# Patient Record
Sex: Female | Born: 1935 | Race: White | Hispanic: No | Smoking: Never smoker
Health system: Southern US, Community
[De-identification: ages and names within clinical notes are randomized; demographics above are authoritative.]

## PROBLEM LIST (undated history)

## (undated) DIAGNOSIS — Z860100 Personal history of colon polyps, unspecified: Secondary | ICD-10-CM

## (undated) DIAGNOSIS — K219 Gastro-esophageal reflux disease without esophagitis: Secondary | ICD-10-CM

## (undated) DIAGNOSIS — M549 Dorsalgia, unspecified: Secondary | ICD-10-CM

## (undated) DIAGNOSIS — G8929 Other chronic pain: Secondary | ICD-10-CM

## (undated) DIAGNOSIS — M419 Scoliosis, unspecified: Secondary | ICD-10-CM

## (undated) DIAGNOSIS — K649 Unspecified hemorrhoids: Secondary | ICD-10-CM

## (undated) DIAGNOSIS — I639 Cerebral infarction, unspecified: Secondary | ICD-10-CM

## (undated) DIAGNOSIS — G4700 Insomnia, unspecified: Secondary | ICD-10-CM

## (undated) DIAGNOSIS — R002 Palpitations: Secondary | ICD-10-CM

## (undated) DIAGNOSIS — R238 Other skin changes: Secondary | ICD-10-CM

## (undated) DIAGNOSIS — J329 Chronic sinusitis, unspecified: Secondary | ICD-10-CM

## (undated) DIAGNOSIS — I509 Heart failure, unspecified: Secondary | ICD-10-CM

## (undated) DIAGNOSIS — K5790 Diverticulosis of intestine, part unspecified, without perforation or abscess without bleeding: Secondary | ICD-10-CM

## (undated) DIAGNOSIS — I6529 Occlusion and stenosis of unspecified carotid artery: Secondary | ICD-10-CM

## (undated) DIAGNOSIS — K222 Esophageal obstruction: Secondary | ICD-10-CM

## (undated) DIAGNOSIS — I10 Essential (primary) hypertension: Secondary | ICD-10-CM

## (undated) DIAGNOSIS — R609 Edema, unspecified: Secondary | ICD-10-CM

## (undated) DIAGNOSIS — R600 Localized edema: Secondary | ICD-10-CM

## (undated) DIAGNOSIS — I080 Rheumatic disorders of both mitral and aortic valves: Secondary | ICD-10-CM

## (undated) DIAGNOSIS — R011 Cardiac murmur, unspecified: Secondary | ICD-10-CM

## (undated) DIAGNOSIS — R233 Spontaneous ecchymoses: Secondary | ICD-10-CM

## (undated) HISTORY — PX: COLONOSCOPY: SHX174

## (undated) HISTORY — DX: Gastro-esophageal reflux disease without esophagitis: K21.9

## (undated) HISTORY — PX: ESOPHAGOGASTRODUODENOSCOPY: SHX1529

## (undated) HISTORY — DX: Esophageal obstruction: K22.2

## (undated) HISTORY — PX: BREAST ENHANCEMENT SURGERY: SHX7

## (undated) HISTORY — DX: Unspecified hemorrhoids: K64.9

## (undated) HISTORY — PX: OTHER SURGICAL HISTORY: SHX169

## (undated) HISTORY — PX: TOTAL HIP ARTHROPLASTY: SHX124

## (undated) HISTORY — PX: EYE SURGERY: SHX253

## (undated) HISTORY — DX: Diverticulosis of intestine, part unspecified, without perforation or abscess without bleeding: K57.90

## (undated) HISTORY — DX: Insomnia, unspecified: G47.00

## (undated) HISTORY — PX: CHOLECYSTECTOMY: SHX55

## (undated) HISTORY — PX: DILATION AND CURETTAGE OF UTERUS: SHX78

## (undated) HISTORY — DX: Scoliosis, unspecified: M41.9

## (undated) HISTORY — DX: Occlusion and stenosis of unspecified carotid artery: I65.29

## (undated) HISTORY — DX: Essential (primary) hypertension: I10

---

## 2000-05-17 ENCOUNTER — Encounter: Payer: Self-pay | Admitting: Emergency Medicine

## 2000-05-17 ENCOUNTER — Emergency Department (HOSPITAL_COMMUNITY): Admission: EM | Admit: 2000-05-17 | Discharge: 2000-05-17 | Payer: Self-pay | Admitting: Emergency Medicine

## 2001-06-08 ENCOUNTER — Other Ambulatory Visit: Admission: RE | Admit: 2001-06-08 | Discharge: 2001-06-08 | Payer: Self-pay | Admitting: *Deleted

## 2003-03-28 ENCOUNTER — Encounter: Payer: Self-pay | Admitting: Internal Medicine

## 2003-03-28 ENCOUNTER — Ambulatory Visit (HOSPITAL_COMMUNITY): Admission: RE | Admit: 2003-03-28 | Discharge: 2003-03-28 | Payer: Self-pay | Admitting: Internal Medicine

## 2005-03-20 ENCOUNTER — Ambulatory Visit: Payer: Self-pay | Admitting: Gastroenterology

## 2005-04-19 ENCOUNTER — Ambulatory Visit (HOSPITAL_COMMUNITY): Admission: RE | Admit: 2005-04-19 | Discharge: 2005-04-19 | Payer: Self-pay | Admitting: Gastroenterology

## 2005-04-19 ENCOUNTER — Ambulatory Visit: Payer: Self-pay | Admitting: Gastroenterology

## 2005-09-07 ENCOUNTER — Emergency Department (HOSPITAL_COMMUNITY): Admission: EM | Admit: 2005-09-07 | Discharge: 2005-09-07 | Payer: Self-pay | Admitting: Emergency Medicine

## 2006-11-19 ENCOUNTER — Ambulatory Visit: Payer: Self-pay | Admitting: Gastroenterology

## 2006-11-25 ENCOUNTER — Ambulatory Visit: Payer: Self-pay | Admitting: Gastroenterology

## 2007-02-20 ENCOUNTER — Encounter: Payer: Self-pay | Admitting: Internal Medicine

## 2007-06-17 ENCOUNTER — Other Ambulatory Visit: Admission: RE | Admit: 2007-06-17 | Discharge: 2007-06-17 | Payer: Self-pay | Admitting: Obstetrics & Gynecology

## 2007-09-14 ENCOUNTER — Encounter: Admission: RE | Admit: 2007-09-14 | Discharge: 2007-09-14 | Payer: Self-pay | Admitting: Internal Medicine

## 2008-12-07 ENCOUNTER — Emergency Department (HOSPITAL_COMMUNITY): Admission: EM | Admit: 2008-12-07 | Discharge: 2008-12-08 | Payer: Self-pay | Admitting: Emergency Medicine

## 2009-06-09 ENCOUNTER — Encounter: Admission: RE | Admit: 2009-06-09 | Discharge: 2009-06-09 | Payer: Self-pay | Admitting: Internal Medicine

## 2010-05-11 ENCOUNTER — Inpatient Hospital Stay (HOSPITAL_COMMUNITY)
Admission: RE | Admit: 2010-05-11 | Discharge: 2010-05-14 | Payer: Self-pay | Source: Home / Self Care | Admitting: Orthopedic Surgery

## 2010-11-08 LAB — CBC
HCT: 27 % — ABNORMAL LOW (ref 36.0–46.0)
HCT: 28.5 % — ABNORMAL LOW (ref 36.0–46.0)
Hemoglobin: 9.3 g/dL — ABNORMAL LOW (ref 12.0–15.0)
MCV: 93.3 fL (ref 78.0–100.0)
MCV: 94.1 fL (ref 78.0–100.0)
MCV: 93.6 fL (ref 78.0–100.0)
Platelets: 137 10*3/uL — ABNORMAL LOW (ref 150–400)
Platelets: 145 10*3/uL — ABNORMAL LOW (ref 150–400)
Platelets: 245 10*3/uL (ref 150–400)
RBC: 2.8 MIL/uL — ABNORMAL LOW (ref 3.87–5.11)
RBC: 3.03 MIL/uL — ABNORMAL LOW (ref 3.87–5.11)
RBC: 4.5 MIL/uL (ref 3.87–5.11)
RDW: 13.3 % (ref 11.5–15.5)
WBC: 12.8 10*3/uL — ABNORMAL HIGH (ref 4.0–10.5)
WBC: 8.6 10*3/uL (ref 4.0–10.5)
WBC: 9 10*3/uL (ref 4.0–10.5)
WBC: 7.3 10*3/uL (ref 4.0–10.5)

## 2010-11-08 LAB — PROTIME-INR
INR: 1.3 (ref 0.00–1.49)
INR: 1.8 — ABNORMAL HIGH (ref 0.00–1.49)
INR: 2.45 — ABNORMAL HIGH (ref 0.00–1.49)
Prothrombin Time: 26.7 seconds — ABNORMAL HIGH (ref 11.6–15.2)

## 2010-11-08 LAB — URINALYSIS, ROUTINE W REFLEX MICROSCOPIC
Bilirubin Urine: NEGATIVE
Hgb urine dipstick: NEGATIVE
Specific Gravity, Urine: 1.018 (ref 1.005–1.030)
Urobilinogen, UA: 0.2 mg/dL (ref 0.0–1.0)
pH: 6 (ref 5.0–8.0)

## 2010-11-08 LAB — TYPE AND SCREEN

## 2010-11-08 LAB — BASIC METABOLIC PANEL
Chloride: 101 mEq/L (ref 96–112)
Creatinine, Ser: 0.77 mg/dL (ref 0.4–1.2)
GFR calc Af Amer: >60 mL/min (ref >60)
GFR calc Af Amer: >60 mL/min (ref >60)
GFR calc non Af Amer: >60 mL/min (ref >60)
GFR calc non Af Amer: >60 mL/min (ref >60)
Potassium: 3.1 mEq/L — ABNORMAL LOW (ref 3.5–5.1)
Potassium: 4.4 mEq/L (ref 3.5–5.1)
Sodium: 138 mEq/L (ref 135–145)

## 2010-11-08 LAB — ABO/RH: ABO/RH(D): A NEG

## 2010-11-08 LAB — COMPREHENSIVE METABOLIC PANEL
ALT: 10 U/L (ref 0–35)
AST: 16 U/L (ref 0–37)
Albumin: 3.9 g/dL (ref 3.5–5.2)
Alkaline Phosphatase: 89 U/L (ref 39–117)
Chloride: 106 mEq/L (ref 96–112)
GFR calc Af Amer: >60 mL/min (ref >60)
Potassium: 4.7 mEq/L (ref 3.5–5.1)
Sodium: 146 mEq/L — ABNORMAL HIGH (ref 135–145)
Total Bilirubin: 0.7 mg/dL (ref 0.3–1.2)
Total Protein: 7.2 g/dL (ref 6.0–8.3)

## 2010-11-08 LAB — URINE MICROSCOPIC-ADD ON

## 2010-11-08 LAB — SURGICAL PCR SCREEN: MRSA, PCR: NEGATIVE

## 2011-01-11 NOTE — Assessment & Plan Note (Signed)
[facility]                         GASTROENTEROLOGY OFFICE NOTE   NAME:Melanie [name], Melanie [name]                     MRN:          [id number]  DATE:11/19/2006                            DOB:          [birth year]    The patient comes in to discuss colon recall and upper endoscopy as  well. She said she had esophageal attack three weeks ago and actually  the food stayed in the throat for almost 24 hours, which I warned her  was not a smart thing to do. She says she is better now. She started  taking her Nexium. She does get some nausea and especially since she has  been taking the Naprosyn that she uses.   Her other medications include:  1. Multivitamin.  2. Hydrochlorothiazide.  3. Calcium.  4. Nexium that we talked about.   She does have a known Schatzki's ring and has had dysphagia before and  she also has diverticulosis. Her last colonoscopic examination was  November 2002. She has had some problems with hemorrhoids associated  with this. She also has hypertension and is status post cholecystectomy  and she does have some arthritis as well. She is to be scheduled for a  colonoscopic examination and at the same time we will do an upper  endoscopy and dilate her accordingly. The patient is satisfied with  this.   PHYSICAL EXAMINATION:  Revealed a weight of 115, blood pressure 100/60,  pulse 80 and regular.  NECK, HEART, EXTREMITIES: Unremarkable.   IMPRESSION:  As above.   It should be noted that she is amazingly healthy-looking 77 year old.  She is retired Scientist, research (medical) and keeps herself  in her good health and eats very healthy she says as well.     Ulyess Mort, MD  Electronically Signed    SML/MedQ  DD: 11/19/2006  DT: 11/19/2006  Job #: [id number]

## 2012-09-28 ENCOUNTER — Encounter: Payer: Self-pay | Admitting: Gastroenterology

## 2012-10-19 ENCOUNTER — Ambulatory Visit (INDEPENDENT_AMBULATORY_CARE_PROVIDER_SITE_OTHER): Payer: Medicare Other | Admitting: Gastroenterology

## 2012-10-19 ENCOUNTER — Encounter: Payer: Self-pay | Admitting: Gastroenterology

## 2012-10-19 VITALS — BP 120/64 | HR 68 | Ht 59.0 in | Wt 116.8 lb

## 2012-10-19 DIAGNOSIS — K921 Melena: Secondary | ICD-10-CM

## 2012-10-19 NOTE — Progress Notes (Signed)
History of Present Illness: This is an 79 year old female noted several episodes of small amounts of bright red blood per rectum for several months.. She previously underwent colonoscopy by Dr. Corinda Gubler in April 2008 which showed diverticulosis and internal hemorrhoids. She has recently been treated by Dr. Kinnie Scales and has had several hemorrhoid treatments. She reports that he performed hemorrhoidal banding. Unfortunately I do not have those records. She was recently she has seen last bleeding and has been using Anusol suppositories intermittently.  Review of Systems: Pertinent positive and negative review of systems were noted in the above HPI section. All other review of systems were otherwise negative.  Current Medications, Allergies, Past Medical History, Past Surgical History, Family History and Social History were reviewed in [employer] record.  Physical Exam: General: Well developed , well nourished, no acute distress Head: Normocephalic and atraumatic Eyes:  sclerae anicteric, EOMI Ears: Normal auditory acuity Mouth: No deformity or lesions Neck: Supple, no masses or thyromegaly Lungs: Clear throughout to auscultation Heart: Regular rate and rhythm; no murmurs, rubs or bruits Abdomen: Soft, non tender and non distended. No masses, hepatosplenomegaly or hernias noted. Normal Bowel sounds Rectal: Small external hemorrhoidal tags no internal lesions Hemoccult negative brown stool the vault Musculoskeletal: Symmetrical with no gross deformities  Skin: No lesions on visible extremities Pulses:  Normal pulses noted Extremities: No clubbing, cyanosis, edema or deformities noted Neurological: Alert oriented x 4, grossly nonfocal Cervical Nodes:  No significant cervical adenopathy Inguinal Nodes: No significant inguinal adenopathy Psychological:  Alert and cooperative. Normal mood and affect  Assessment and Recommendations:  1. Small-volume hematochezia likely  secondary to internal hemorrhoids. Rule out other colorectal sources of bleeding. I recommended proceeding with colonoscopy to evaluate for other causes of rectal bleeding and to consider referral to a colorectal specialist for hemorrhoid management. For now she prefers to treat her internal hemorrhoids with suppositories. I advised her to contact us if she would like to proceed with colonoscopy.

## 2012-10-19 NOTE — Patient Instructions (Addendum)
It has been recommended to you by your physician that you have a(n) Colonoscopy completed. Per your request, we did not schedule the procedure(s) today. Please contact our office at [phone] should you decide to have the procedure completed.  You have been given rectal care instructions.   Continue Anusol suppositories.   Thank you for choosing me and [facility].  Venita Lick. Pleas Koch., MD., Clementeen Graham

## 2013-01-01 ENCOUNTER — Other Ambulatory Visit: Payer: Self-pay | Admitting: Neurological Surgery

## 2013-01-01 DIAGNOSIS — M47816 Spondylosis without myelopathy or radiculopathy, lumbar region: Secondary | ICD-10-CM

## 2013-01-06 ENCOUNTER — Ambulatory Visit
Admission: RE | Admit: 2013-01-06 | Discharge: 2013-01-06 | Disposition: A | Discharge status: Home / Self Care | Payer: 59 | Source: Ambulatory Visit | Attending: Neurological Surgery | Admitting: Neurological Surgery

## 2013-01-06 DIAGNOSIS — M47816 Spondylosis without myelopathy or radiculopathy, lumbar region: Secondary | ICD-10-CM

## 2013-01-14 ENCOUNTER — Other Ambulatory Visit: Payer: Self-pay | Admitting: Neurological Surgery

## 2013-01-20 ENCOUNTER — Encounter (HOSPITAL_COMMUNITY): Payer: Self-pay | Admitting: Pharmacy Technician

## 2013-01-22 ENCOUNTER — Ambulatory Visit (HOSPITAL_COMMUNITY)
Admission: RE | Admit: 2013-01-22 | Discharge: 2013-01-22 | Disposition: A | Discharge status: Home / Self Care | Payer: Medicare Other | Source: Ambulatory Visit | Attending: Anesthesiology | Admitting: Anesthesiology

## 2013-01-22 ENCOUNTER — Encounter (HOSPITAL_COMMUNITY): Payer: Self-pay

## 2013-01-22 ENCOUNTER — Encounter (HOSPITAL_COMMUNITY)
Admission: RE | Admit: 2013-01-22 | Discharge: 2013-01-22 | Disposition: A | Discharge status: Home / Self Care | Payer: Medicare Other | Source: Ambulatory Visit | Attending: Neurological Surgery | Admitting: Neurological Surgery

## 2013-01-22 DIAGNOSIS — Z01812 Encounter for preprocedural laboratory examination: Secondary | ICD-10-CM | POA: Insufficient documentation

## 2013-01-22 DIAGNOSIS — I10 Essential (primary) hypertension: Secondary | ICD-10-CM | POA: Insufficient documentation

## 2013-01-22 DIAGNOSIS — M4120 Other idiopathic scoliosis, site unspecified: Secondary | ICD-10-CM | POA: Insufficient documentation

## 2013-01-22 DIAGNOSIS — M48061 Spinal stenosis, lumbar region without neurogenic claudication: Secondary | ICD-10-CM | POA: Insufficient documentation

## 2013-01-22 DIAGNOSIS — Z01810 Encounter for preprocedural cardiovascular examination: Secondary | ICD-10-CM | POA: Insufficient documentation

## 2013-01-22 HISTORY — DX: Spontaneous ecchymoses: R23.3

## 2013-01-22 HISTORY — DX: Personal history of colonic polyps: Z86.010

## 2013-01-22 HISTORY — DX: Personal history of colon polyps, unspecified: Z86.0100

## 2013-01-22 HISTORY — DX: Dorsalgia, unspecified: M54.9

## 2013-01-22 HISTORY — DX: Chronic sinusitis, unspecified: J32.9

## 2013-01-22 HISTORY — DX: Localized edema: R60.0

## 2013-01-22 HISTORY — DX: Edema, unspecified: R60.9

## 2013-01-22 HISTORY — DX: Other skin changes: R23.8

## 2013-01-22 HISTORY — DX: Other chronic pain: G89.29

## 2013-01-22 HISTORY — DX: Cardiac murmur, unspecified: R01.1

## 2013-01-22 LAB — BASIC METABOLIC PANEL
BUN: 16 mg/dL (ref 6–23)
CO2: 32 mEq/L (ref 19–32)
Calcium: 10 mg/dL (ref 8.4–10.5)
Creatinine, Ser: 0.73 mg/dL (ref 0.50–1.10)
GFR calc Af Amer: 87 mL/min — ABNORMAL LOW (ref >90)

## 2013-01-22 LAB — CBC
MCHC: 33.8 g/dL (ref 30.0–36.0)
MCV: 93.1 fL (ref 78.0–100.0)
Platelets: 235 10*3/uL (ref 150–400)
RDW: 14.3 % (ref 11.5–15.5)
WBC: 6.8 10*3/uL (ref 4.0–10.5)

## 2013-01-22 LAB — SURGICAL PCR SCREEN
MRSA, PCR: NEGATIVE
Staphylococcus aureus: POSITIVE — AB

## 2013-01-22 NOTE — Pre-Procedure Instructions (Signed)
Mayling Aber Gradillas  01/22/2013   Your procedure is scheduled on:  Tues, June 3 @ 11:58 AM  Report to [facility] at 9:00 AM.  Call this number if you have problems the morning of surgery: [phone]   Remember:   Do not eat food or drink liquids after midnight.   Take these medicines the morning of surgery with A SIP OF WATER: Amlodipine(Norvasc),Metoprolol(Toprol),and Protonix(Pantoprazole)              Stop taking your Aspirin.No Goody's,BC's,Ibuprofen,Aleve,Fish Oil,or any Herbal Medications   Do not wear jewelry, make-up or nail polish.  Do not wear lotions, powders, or perfumes. You may wear deodorant.  Do not shave 48 hours prior to surgery. .  Do not bring valuables to the hospital.  [facility] is not responsible                   for any belongings or valuables.  Contacts, dentures or bridgework may not be worn into surgery.  Leave suitcase in the car. After surgery it may be brought to your room.  For patients admitted to the hospital, checkout time is 11:00 AM the day of  discharge.   Patients discharged the day of surgery will not be allowed to drive  home.    Special Instructions: Shower using CHG 2 nights before surgery and the night before surgery.  If you shower the day of surgery use CHG.  Use special wash - you have one bottle of CHG for all showers.  You should use approximately 1/3 of the bottle for each shower.   Please read over the following fact sheets that you were given: Pain Booklet, Coughing and Deep Breathing, MRSA Information and Surgical Site Infection Prevention

## 2013-01-22 NOTE — Progress Notes (Signed)
Pt doesn't have a cardiologist  Echo/Stress test done 9yrs ago  Denies ever having a heart cath   Dr.Richard [name] is Medical Md  Denies EKG or CXR in past yr

## 2013-01-25 MED ORDER — CEFAZOLIN SODIUM-DEXTROSE 2-3 GM-% IV SOLR
2.0000 g | INTRAVENOUS | Status: AC
  Administered 2013-01-26: 2 g via INTRAVENOUS
  Filled 2013-01-25: qty 50

## 2013-01-26 ENCOUNTER — Ambulatory Visit (HOSPITAL_COMMUNITY): Payer: Medicare Other

## 2013-01-26 ENCOUNTER — Ambulatory Visit (HOSPITAL_COMMUNITY)
Admission: RE | Admit: 2013-01-26 | Discharge: 2013-01-27 | DRG: 491 | Disposition: A | Discharge status: Home / Self Care | Payer: Medicare Other | Source: Ambulatory Visit | Attending: Neurological Surgery | Admitting: Neurological Surgery

## 2013-01-26 ENCOUNTER — Encounter (HOSPITAL_COMMUNITY): Payer: Self-pay | Admitting: Anesthesiology

## 2013-01-26 ENCOUNTER — Ambulatory Visit (HOSPITAL_COMMUNITY): Payer: Medicare Other | Admitting: Anesthesiology

## 2013-01-26 ENCOUNTER — Encounter (HOSPITAL_COMMUNITY)
Admission: RE | Disposition: A | Discharge status: Home / Self Care | Payer: Self-pay | Source: Ambulatory Visit | Attending: Neurological Surgery

## 2013-01-26 DIAGNOSIS — M47817 Spondylosis without myelopathy or radiculopathy, lumbosacral region: Secondary | ICD-10-CM | POA: Insufficient documentation

## 2013-01-26 DIAGNOSIS — M419 Scoliosis, unspecified: Secondary | ICD-10-CM | POA: Diagnosis present

## 2013-01-26 DIAGNOSIS — I10 Essential (primary) hypertension: Secondary | ICD-10-CM | POA: Insufficient documentation

## 2013-01-26 DIAGNOSIS — Z7982 Long term (current) use of aspirin: Secondary | ICD-10-CM | POA: Insufficient documentation

## 2013-01-26 DIAGNOSIS — M48062 Spinal stenosis, lumbar region with neurogenic claudication: Secondary | ICD-10-CM | POA: Insufficient documentation

## 2013-01-26 DIAGNOSIS — M4120 Other idiopathic scoliosis, site unspecified: Secondary | ICD-10-CM | POA: Insufficient documentation

## 2013-01-26 DIAGNOSIS — Z79899 Other long term (current) drug therapy: Secondary | ICD-10-CM | POA: Insufficient documentation

## 2013-01-26 HISTORY — PX: LUMBAR LAMINECTOMY/DECOMPRESSION MICRODISCECTOMY: SHX5026

## 2013-01-26 SURGERY — LUMBAR LAMINECTOMY/DECOMPRESSION MICRODISCECTOMY 1 LEVEL
Anesthesia: General | Site: Back | Laterality: Left | Wound class: Clean

## 2013-01-26 MED ORDER — ONDANSETRON HCL 4 MG/2ML IJ SOLN
INTRAMUSCULAR | Status: DC | PRN
  Administered 2013-01-26: 4 mg via INTRAVENOUS

## 2013-01-26 MED ORDER — PROPOFOL 10 MG/ML IV BOLUS
INTRAVENOUS | Status: DC | PRN
  Administered 2013-01-26: 130 mg via INTRAVENOUS

## 2013-01-26 MED ORDER — OXYCODONE HCL 5 MG PO TABS: 5.0000 mg | ORAL_TABLET | Freq: Once | ORAL | Status: DC | PRN

## 2013-01-26 MED ORDER — METOPROLOL SUCCINATE ER 50 MG PO TB24
50.0000 mg | ORAL_TABLET | Freq: Every day | ORAL | Status: DC
  Administered 2013-01-27: 50 mg via ORAL
  Filled 2013-01-26: qty 1

## 2013-01-26 MED ORDER — FENTANYL CITRATE 0.05 MG/ML IJ SOLN
INTRAMUSCULAR | Status: DC | PRN
  Administered 2013-01-26: 75 ug via INTRAVENOUS

## 2013-01-26 MED ORDER — SODIUM CHLORIDE 0.9 % IV SOLN: 250.0000 mL | INTRAVENOUS | Status: DC

## 2013-01-26 MED ORDER — MENTHOL 3 MG MT LOZG
1.0000 | LOZENGE | OROMUCOSAL | Status: DC | PRN
  Filled 2013-01-26: qty 9

## 2013-01-26 MED ORDER — ROCURONIUM BROMIDE 100 MG/10ML IV SOLN
INTRAVENOUS | Status: DC | PRN
  Administered 2013-01-26: 50 mg via INTRAVENOUS

## 2013-01-26 MED ORDER — ARTIFICIAL TEARS OP OINT
TOPICAL_OINTMENT | OPHTHALMIC | Status: DC | PRN
  Administered 2013-01-26: 1 via OPHTHALMIC

## 2013-01-26 MED ORDER — SODIUM CHLORIDE 0.9 % IR SOLN
Status: DC | PRN
  Administered 2013-01-26: 12:00:00

## 2013-01-26 MED ORDER — ACETAMINOPHEN 325 MG PO TABS: 650.0000 mg | ORAL_TABLET | ORAL | Status: DC | PRN

## 2013-01-26 MED ORDER — ONDANSETRON HCL 4 MG/2ML IJ SOLN: 4.0000 mg | INTRAMUSCULAR | Status: DC | PRN

## 2013-01-26 MED ORDER — HYDROCHLOROTHIAZIDE 25 MG PO TABS
25.0000 mg | ORAL_TABLET | Freq: Every day | ORAL | Status: DC
  Administered 2013-01-26 – 2013-01-27 (×2): 25 mg via ORAL
  Filled 2013-01-26 (×2): qty 1

## 2013-01-26 MED ORDER — NEOSTIGMINE METHYLSULFATE 1 MG/ML IJ SOLN
INTRAMUSCULAR | Status: DC | PRN
  Administered 2013-01-26: 3 mg via INTRAVENOUS

## 2013-01-26 MED ORDER — LIDOCAINE-EPINEPHRINE 1 %-1:100000 IJ SOLN
INTRAMUSCULAR | Status: DC | PRN
  Administered 2013-01-26: 5 mL

## 2013-01-26 MED ORDER — PANTOPRAZOLE SODIUM 40 MG PO TBEC
40.0000 mg | DELAYED_RELEASE_TABLET | Freq: Every day | ORAL | Status: DC
  Administered 2013-01-27: 40 mg via ORAL
  Filled 2013-01-26: qty 1

## 2013-01-26 MED ORDER — PHENOL 1.4 % MT LIQD
1.0000 | OROMUCOSAL | Status: DC | PRN
  Filled 2013-01-26: qty 177

## 2013-01-26 MED ORDER — HEMOSTATIC AGENTS (NO CHARGE) OPTIME
TOPICAL | Status: DC | PRN
  Administered 2013-01-26: 1 via TOPICAL

## 2013-01-26 MED ORDER — ONDANSETRON HCL 4 MG/2ML IJ SOLN: 4.0000 mg | Freq: Four times a day (QID) | INTRAMUSCULAR | Status: DC | PRN

## 2013-01-26 MED ORDER — DIAZEPAM 5 MG PO TABS: 5.0000 mg | ORAL_TABLET | Freq: Four times a day (QID) | ORAL | Status: DC | PRN

## 2013-01-26 MED ORDER — ALUM & MAG HYDROXIDE-SIMETH 200-200-20 MG/5ML PO SUSP: 30.0000 mL | Freq: Four times a day (QID) | ORAL | Status: DC | PRN

## 2013-01-26 MED ORDER — BACITRACIN 50000 UNITS IM SOLR
INTRAMUSCULAR | Status: AC
  Filled 2013-01-26: qty 1

## 2013-01-26 MED ORDER — LIDOCAINE HCL (CARDIAC) 20 MG/ML IV SOLN
INTRAVENOUS | Status: DC | PRN
  Administered 2013-01-26: 70 mg via INTRAVENOUS

## 2013-01-26 MED ORDER — 0.9 % SODIUM CHLORIDE (POUR BTL) OPTIME
TOPICAL | Status: DC | PRN
  Administered 2013-01-26: 1000 mL

## 2013-01-26 MED ORDER — SODIUM CHLORIDE 0.9 % IV SOLN
INTRAVENOUS | Status: AC
  Filled 2013-01-26: qty 500

## 2013-01-26 MED ORDER — ACETAMINOPHEN 650 MG RE SUPP: 650.0000 mg | RECTAL | Status: DC | PRN

## 2013-01-26 MED ORDER — THROMBIN 5000 UNITS EX SOLR
CUTANEOUS | Status: DC | PRN
  Administered 2013-01-26 (×2): 5000 [IU] via TOPICAL

## 2013-01-26 MED ORDER — BUPIVACAINE HCL (PF) 0.5 % IJ SOLN
INTRAMUSCULAR | Status: DC | PRN
  Administered 2013-01-26: 5 mL
  Administered 2013-01-26: 10 mL

## 2013-01-26 MED ORDER — FENTANYL CITRATE 0.05 MG/ML IJ SOLN: 25.0000 ug | INTRAMUSCULAR | Status: DC | PRN

## 2013-01-26 MED ORDER — LACTATED RINGERS IV SOLN
INTRAVENOUS | Status: DC | PRN
  Administered 2013-01-26: 12:00:00 via INTRAVENOUS

## 2013-01-26 MED ORDER — SODIUM CHLORIDE 0.9 % IJ SOLN: 3.0000 mL | Freq: Two times a day (BID) | INTRAMUSCULAR | Status: DC

## 2013-01-26 MED ORDER — OXYCODONE HCL 5 MG/5ML PO SOLN: 5.0000 mg | Freq: Once | ORAL | Status: DC | PRN

## 2013-01-26 MED ORDER — MORPHINE SULFATE 2 MG/ML IJ SOLN: 1.0000 mg | INTRAMUSCULAR | Status: DC | PRN

## 2013-01-26 MED ORDER — GLYCOPYRROLATE 0.2 MG/ML IJ SOLN
INTRAMUSCULAR | Status: DC | PRN
  Administered 2013-01-26: 0.4 mg via INTRAVENOUS

## 2013-01-26 MED ORDER — SODIUM CHLORIDE 0.9 % IJ SOLN: 3.0000 mL | INTRAMUSCULAR | Status: DC | PRN

## 2013-01-26 MED ORDER — OXYCODONE-ACETAMINOPHEN 5-325 MG PO TABS
1.0000 | ORAL_TABLET | ORAL | Status: DC | PRN
  Administered 2013-01-26 – 2013-01-27 (×3): 1 via ORAL
  Filled 2013-01-26 (×3): qty 1

## 2013-01-26 MED ORDER — AMLODIPINE BESYLATE 5 MG PO TABS
5.0000 mg | ORAL_TABLET | Freq: Every day | ORAL | Status: DC
  Administered 2013-01-27: 5 mg via ORAL
  Filled 2013-01-26: qty 1

## 2013-01-26 SURGICAL SUPPLY — 54 items
ADH SKN CLS APL DERMABOND .7 (GAUZE/BANDAGES/DRESSINGS) ×1
BAG DECANTER FOR FLEXI CONT (MISCELLANEOUS) ×2 IMPLANT
BLADE SURG ROTATE 9660 (MISCELLANEOUS) IMPLANT
BUR ACORN 6.0 (BURR) IMPLANT
BUR MATCHSTICK NEURO 3.0 LAGG (BURR) ×2 IMPLANT
CANISTER SUCTION 2500CC (MISCELLANEOUS) ×2 IMPLANT
CLOTH BEACON ORANGE TIMEOUT ST (SAFETY) ×2 IMPLANT
CONT SPEC 4OZ CLIKSEAL STRL BL (MISCELLANEOUS) ×2 IMPLANT
DECANTER SPIKE VIAL GLASS SM (MISCELLANEOUS) ×2 IMPLANT
DERMABOND ADVANCED (GAUZE/BANDAGES/DRESSINGS) ×1
DERMABOND ADVANCED .7 DNX12 (GAUZE/BANDAGES/DRESSINGS) ×1 IMPLANT
DRAPE LAPAROTOMY T 102X78X121 (DRAPES) ×2 IMPLANT
DRAPE MICROSCOPE LEICA (MISCELLANEOUS) IMPLANT
DRAPE MICROSCOPE ZEISS OPMI (DRAPES) ×1 IMPLANT
DRAPE POUCH INSTRU U-SHP 10X18 (DRAPES) ×2 IMPLANT
DRAPE PROXIMA HALF (DRAPES) IMPLANT
DURAPREP 26ML APPLICATOR (WOUND CARE) ×2 IMPLANT
ELECT REM PT RETURN 9FT ADLT (ELECTROSURGICAL) ×2
ELECTRODE REM PT RTRN 9FT ADLT (ELECTROSURGICAL) ×1 IMPLANT
GAUZE SPONGE 4X4 16PLY XRAY LF (GAUZE/BANDAGES/DRESSINGS) IMPLANT
GLOVE BIOGEL PI IND STRL 7.0 (GLOVE) IMPLANT
GLOVE BIOGEL PI IND STRL 8.5 (GLOVE) ×1 IMPLANT
GLOVE BIOGEL PI INDICATOR 7.0 (GLOVE) ×1
GLOVE BIOGEL PI INDICATOR 8.5 (GLOVE) ×1
GLOVE ECLIPSE 7.5 STRL STRAW (GLOVE) ×1 IMPLANT
GLOVE ECLIPSE 8.5 STRL (GLOVE) ×2 IMPLANT
GLOVE EXAM NITRILE LRG STRL (GLOVE) IMPLANT
GLOVE EXAM NITRILE MD LF STRL (GLOVE) IMPLANT
GLOVE EXAM NITRILE XL STR (GLOVE) IMPLANT
GLOVE EXAM NITRILE XS STR PU (GLOVE) IMPLANT
GLOVE INDICATOR 7.5 STRL GRN (GLOVE) ×1 IMPLANT
GOWN BRE IMP SLV AUR LG STRL (GOWN DISPOSABLE) ×1 IMPLANT
GOWN BRE IMP SLV AUR XL STRL (GOWN DISPOSABLE) ×1 IMPLANT
GOWN STRL REIN 2XL LVL4 (GOWN DISPOSABLE) ×2 IMPLANT
KIT BASIN OR (CUSTOM PROCEDURE TRAY) ×2 IMPLANT
KIT ROOM TURNOVER OR (KITS) ×2 IMPLANT
NDL SPNL 20GX3.5 QUINCKE YW (NEEDLE) IMPLANT
NEEDLE HYPO 22GX1.5 SAFETY (NEEDLE) ×2 IMPLANT
NEEDLE SPNL 20GX3.5 QUINCKE YW (NEEDLE) IMPLANT
NS IRRIG 1000ML POUR BTL (IV SOLUTION) ×2 IMPLANT
PACK LAMINECTOMY NEURO (CUSTOM PROCEDURE TRAY) ×2 IMPLANT
PAD ARMBOARD 7.5X6 YLW CONV (MISCELLANEOUS) ×6 IMPLANT
PATTIES SURGICAL .5 X1 (DISPOSABLE) ×2 IMPLANT
RUBBERBAND STERILE (MISCELLANEOUS) ×2 IMPLANT
SPONGE GAUZE 4X4 12PLY (GAUZE/BANDAGES/DRESSINGS) ×2 IMPLANT
SPONGE SURGIFOAM ABS GEL SZ50 (HEMOSTASIS) ×2 IMPLANT
SUT VIC AB 1 CT1 18XBRD ANBCTR (SUTURE) ×1 IMPLANT
SUT VIC AB 1 CT1 8-18 (SUTURE) ×2
SUT VIC AB 2-0 CP2 18 (SUTURE) ×2 IMPLANT
SUT VIC AB 3-0 SH 8-18 (SUTURE) ×2 IMPLANT
SYR 20ML ECCENTRIC (SYRINGE) ×2 IMPLANT
TOWEL OR 17X24 6PK STRL BLUE (TOWEL DISPOSABLE) ×2 IMPLANT
TOWEL OR 17X26 10 PK STRL BLUE (TOWEL DISPOSABLE) ×2 IMPLANT
WATER STERILE IRR 1000ML POUR (IV SOLUTION) ×2 IMPLANT

## 2013-01-26 NOTE — Op Note (Signed)
Preoperative diagnosis: Lumbar spinal stenosis L4-L5 secondary to spondylosis and left lateral recess stenosis, left lumbar radiculopathy Postoperative diagnosis: Lumbar spinal stenosis L4-L5 secondary to spondylosis and left lateral recess stenosis, left lumbar radiculopathy Procedure: Laminotomy foraminotomy decompression of L4 and L5 nerve roots on the left using operating microscope microdissection technique left microdiscectomy3 Surgeon: Barnett Abu M.D. Assistant: Sharyon Cable M.D. Anesthesia: Gen. endotracheal Indications: Patient is an 79 year old individual is had significant back pain and buttock pain on the is been advised regarding surgical decompression having failed all other forms of conservative management to this point.left side with occasional left radicular pain. She has a degenerative scoliosis and has a severe spondylitic narrowing on the left side at L4-L5.  Procedure: Patient was brought to the operating room supine on a stretcher. After the smooth induction of general endotracheal anesthesia he was turned prone onto the operating table. The back was prepped with alcohol and DuraPrep and draped in a sterile fashion. Localizing radiographs identified the interspace at L4-L5 . A midline incision was created and carried down to the lumbar dorsal fascia which was opened on either side of midline at this level. The dissection was carried out over the interlaminar space and the facet joints at L4-L5 . A self-retaining retractor was placed in the wound. A high-speed drill was then used to remove the inferior margin of the lamina out to the medial wall the facet performing the initial portion of the dissection. The yellow ligament was then taken up and removed. Common dural tube was identified and dissection was carefully undertaken removing redundant yellow ligament and overgrown facet from the superior facet of L4 and the laminar arch of L4 . A foraminotomy was created over the L5 nerve  root. there was a large bony growth from the superior medial margin of the facet joint that causes substantial mass effect on the common dural tube and the L5 nerve root inferiorly but this mass also protruded into the foramen for the L4 nerve root above the superior articular process of the L5 facet. This was carefully dissected with a high-speed drill and removed in a piecemeal fashion curved Kerrisons were then used to create a larger foraminotomies for the L4 nerve root. In the end the common dural tube and the L4 and L5 nerve roots were decompressed. In the region of the disc space there was noted to be a significant mass just inferior to the disc space behind the vertebral body of L5. This was incised several small fragments of disc were cleared from the subligamentous space. This further flatten the opening and passage for the L5 nerve root the -L4 5 disc space was not entered.   Once an adequate decompression was identified and secured, hemostasis and the soft tissues obtained meticulously and when verified retractor was removed the wound was irrigated copiously with antibiotic irrigating solution, and then the lumbar dorsal fascia was closed with #1 Vicryl in interrupted fashion. 10 cc Of half percent Marcaine was injected into the paraspinous fascia. 2-0 Vicryl was used to close the subcutaneous fascia and 3-0 Vicryl  was used to close the subcuticular skin. Blood loss was estimated as 50 cc. The patient was returned to the recovery room in stable condition

## 2013-01-26 NOTE — H&P (Addendum)
Melanie [name] is an 79 y.o. female.   Chief Complaint: Back and left leg pain HPI: Patient is an 79 y.o. With scoliosis and pain in the buttock and left leg. Films demonstrate al severe stenosis at L4-5. She has failed all manor of conservative treatment over the past twp years. She is admitted for surgery.  Past Medical History  Diagnosis Date  . Diverticulosis   . Schatzki's ring   . Hemorrhoids   . Carotid stenosis   . Scoliosis   . Insomnia   . Esophageal stricture   . Hypertension     takes Amlodipine,HCTZ,and Metoprolol  . Heart murmur     as a child  . Chronic back pain     stenosis  . Bruises easily     on Aspirin;on hold for surgery  . GERD (gastroesophageal reflux disease)     takes Protonix daily  . History of colon polyps   . Peripheral edema   . Chronic sinusitis     Past Surgical History  Procedure Laterality Date  . Cholecystectomy    . Dilation and curettage of uterus    . Breast enhancement surgery    . Total hip arthroplasty Right   . Colonoscopy    . Esophagogastroduodenoscopy    . Eye surgery      left cataract removed  . Epidural injections    . Steroid injections      x 3    Family History  Problem Relation Age of Onset  . Rheum arthritis Mother   . Renal cancer Sister   . CAD Brother   . Lung cancer Brother    Social History:  reports that she has never smoked. She has never used smokeless tobacco. She reports that she does not drink alcohol or use illicit drugs.  Allergies: No Known Allergies  No prescriptions prior to admission    No results found for this or any previous visit (from the past 48 hour(s)). No results found.  Review of Systems  HENT: Negative.   Eyes: Negative.   Cardiovascular: Negative.   Gastrointestinal: Negative.   Genitourinary: Negative.   Musculoskeletal: Positive for back pain.  Skin: Negative.   Neurological: Positive for focal weakness and weakness.  Endo/Heme/Allergies: Negative.    Psychiatric/Behavioral: Negative.     There were no vitals taken for this visit. Physical Exam  Constitutional: She is oriented to person, place, and time. She appears well-developed and well-nourished.  HENT:  Head: Normocephalic and atraumatic.  Eyes: Conjunctivae and EOM are normal.  Neck: Neck supple.  Cardiovascular: Normal rate, regular rhythm, normal heart sounds and intact distal pulses.   Respiratory: Effort normal and breath sounds normal.  GI: Soft. Bowel sounds are normal.  Musculoskeletal: Normal range of motion.  Neurological: She is alert and oriented to person, place, and time. She has normal reflexes.  Skin: Skin is warm and dry.  Psychiatric: She has a normal mood and affect. Her behavior is normal. Judgment and thought content normal.     Assessment/Plan  Melanie [name] had a new MRI of her lumbar spine that was completed on 01/06/2013.  DATA: I reviewed the study with her and her husband and note that she does have the advanced scoliosis that we had discussed previously. The most significant finding, however, is that Melanie [name] has severe, advanced stenosis at L4-L5, worse on the left side, secondary to a large hypertrophy of the facet joint and what appears to be a substantial synovial cyst in this region.  IMPRESSION/PLAN: I indicated and demonstrated the findings to Melanie [name] and her husband and note that in terms of any surgical intervention, it would need to be a substantially limited procedure because I do not believe that Melanie [name] would at all tolerate the nature of surgery to decompress and stabilize the entirety of her scoliosis. She could, however, do well with decompression of L4-L5 on that left side. This is where the focus of the worst stenosis is, and I believe that removing this overgrown synovial cyst should allow some resolution to the central canal stenosis and hopefully help with the left lumbar radicular pain that she gets in addition to some of the back  discomfort. Surgery, of course, always needs to be considered carefully. I note that Melanie [name] is using an aspirin a day. I suggested that if surgery is contemplated, we should have her stop the aspirin for at least a weeks' time. I noted to her that typically the surgery takes about an hour-and-a-half to two hours to do. The hospital stay can be as short as an overnight but may require a couple days of recuperation in the hospital. The biggest risk of the surgery is that of a spinal fluid leak and the potential, of course, for infection. These two things notwithstanding, I believe that this is something that could helped Lolitha' situation overall. She is admitted for surgery.   Sajan Cheatwood J 01/26/2013, 6:56 AM

## 2013-01-26 NOTE — Preoperative (Signed)
Beta Blockers   Reason not to administer Beta Blockers:Not Applicable 

## 2013-01-26 NOTE — Anesthesia Preprocedure Evaluation (Addendum)
Anesthesia Evaluation  Patient identified by MRN, date of birth, ID band Patient awake    Reviewed: Allergy & Precautions, H&P , NPO status , Patient's Chart, lab work & pertinent test results, reviewed documented beta blocker date and time   Airway Mallampati: II  Neck ROM: full    Dental  (+) Caps, Teeth Intact and Dental Advisory Given   Pulmonary          Cardiovascular hypertension, Pt. on medications + Peripheral Vascular Disease     Neuro/Psych    GI/Hepatic GERD-  Medicated and Controlled,Esophageal stricture   Endo/Other    Renal/GU      Musculoskeletal   Abdominal   Peds  Hematology   Anesthesia Other Findings   Reproductive/Obstetrics                          Anesthesia Physical Anesthesia Plan  ASA: III  Anesthesia Plan: General   Post-op Pain Management:    Induction: Intravenous  Airway Management Planned: Oral ETT  Additional Equipment:   Intra-op Plan:   Post-operative Plan: Extubation in OR  Informed Consent: I have reviewed the patients History and Physical, chart, labs and discussed the procedure including the risks, benefits and alternatives for the proposed anesthesia with the patient or authorized representative who has indicated his/her understanding and acceptance.     Plan Discussed with: CRNA, Anesthesiologist and Surgeon  Anesthesia Plan Comments:         Anesthesia Quick Evaluation

## 2013-01-26 NOTE — Transfer of Care (Signed)
Immediate Anesthesia Transfer of Care Note  Patient: Melanie [name]  Procedure(s) Performed: Procedure(s) with comments: LUMBAR LAMINECTOMY/DECOMPRESSION MICRODISCECTOMY 1 LEVEL (Left) - Left Lumbar four-five Laminectomy/Foraminotomy  Patient Location: PACU  Anesthesia Type:General  Level of Consciousness: awake, alert  and oriented  Airway & Oxygen Therapy: Patient Spontanous Breathing and Patient connected to face mask oxygen  Post-op Assessment: Report given to PACU RN  Post vital signs: Reviewed and stable  Complications: No apparent anesthesia complications

## 2013-01-27 DIAGNOSIS — M419 Scoliosis, unspecified: Secondary | ICD-10-CM | POA: Diagnosis present

## 2013-01-27 DIAGNOSIS — M48062 Spinal stenosis, lumbar region with neurogenic claudication: Secondary | ICD-10-CM | POA: Diagnosis present

## 2013-01-27 MED ORDER — DIAZEPAM 5 MG PO TABS: 5.0000 mg | ORAL_TABLET | Freq: Four times a day (QID) | ORAL | Status: DC | PRN

## 2013-01-27 MED ORDER — OXYCODONE-ACETAMINOPHEN 5-325 MG PO TABS: 1.0000 | ORAL_TABLET | ORAL | Status: DC | PRN

## 2013-01-27 NOTE — Discharge Summary (Signed)
Physician Discharge Summary  Patient ID: Melanie [name] MRN: [id number] DOB/AGE: 1923/11/16 79 y.o.  Admit date: 01/26/2013 Discharge date: 01/27/2013  Admission Diagnoses: Lumbar stenosis, lumbar scoliosis, neurogenic claudication, lumbar radiculopathy L4-5  Discharge Diagnoses: Lumbar stenosis, lumbar scoliosis, neurogenic claudication, lumbar radiculopathy L4-5 Active Problems:   * No active hospital problems. *   Discharged Condition: good  Hospital Course: Patient was admitted to undergo surgical decompression at L4-5 on left side secondary to a large spondylitic overgrowth at the apex of a scoliotic curve she tolerated surgery well incisions clean and dry.  Consults: None  Significant Diagnostic Studies: None  Treatments: surgery: Laminotomy foraminotomies microdiscectomy L4-L5 left with operating microscope micro-dissection technique  Discharge Exam: Blood pressure 131/77, pulse 67, temperature 98.9 F (37.2 C), temperature source Oral, resp. rate 16, SpO2 92.00%. Incision is clean and dry motor function is good in lower extremities  Disposition:  discharge home  Discharge Orders   Future Orders Complete By Expires     Call MD for:  redness, tenderness, or signs of infection (pain, swelling, redness, odor or green/yellow discharge around incision site)  As directed     Call MD for:  severe uncontrolled pain  As directed     Call MD for:  temperature >100.4  As directed     Diet - low sodium heart healthy  As directed     Discharge instructions  As directed     Comments:      Okay to shower. Do not apply salves or appointments to incision. No heavy lifting with the upper extremities greater than 15 pounds. May resume driving when not requiring pain medication and patient feels comfortable with doing so.    Increase activity slowly  As directed         Medication List    TAKE these medications       amLODipine 5 MG tablet  Commonly known as:  NORVASC  Take 5 mg by  mouth daily.     aspirin EC 81 MG tablet  Take 81 mg by mouth daily.     diazepam 5 MG tablet  Commonly known as:  VALIUM  Take 1 tablet (5 mg total) by mouth every 6 (six) hours as needed (muscle spasm).     Glucosamine 500 MG Caps  Take 1 capsule by mouth daily as needed (for joints).     hydrochlorothiazide 25 MG tablet  Commonly known as:  HYDRODIURIL  Take 25 mg by mouth daily.     lidocaine 5 %  Commonly known as:  LIDODERM  Place 1 patch onto the skin as needed (for pain.). Remove & Discard patch within 12 hours or as directed by MD     metoprolol succinate 50 MG 24 hr tablet  Commonly known as:  TOPROL-XL  Take 50 mg by mouth daily. Take with or immediately following a meal.     multivitamin tablet  Take 1 tablet by mouth daily.     oxyCODONE-acetaminophen 5-325 MG per tablet  Commonly known as:  PERCOCET/ROXICET  Take 1-2 tablets by mouth every 4 (four) hours as needed.     pantoprazole 40 MG tablet  Commonly known as:  PROTONIX  Take 40 mg by mouth daily.     Vitamin D3 2000 UNITS Tabs  Take 1 tablet by mouth daily.         SignedStefani Dama 01/27/2013, 8:33 AM

## 2013-01-27 NOTE — Evaluation (Signed)
Physical Therapy Evaluation Patient Details Name: Melanie [name] MRN: [id number] DOB: May 01, 1924 Today's Date: 01/27/2013 Time: 4098-1191 PT Time Calculation (min): 28 min  PT Assessment / Plan / Recommendation Clinical Impression  79 yo female who inderwent lumbar decompression. She is doing well with initial  functional mobility post op. Recommend continued HHPT at discharge to regain full independence at home    PT Assessment  Patient needs continued PT services    Follow Up Recommendations  Home health PT    Does the patient have the potential to tolerate intense rehabilitation      Barriers to Discharge None      Equipment Recommendations  None recommended by PT    Recommendations for Other Services     Frequency Min 3X/week    Precautions / Restrictions Precautions Precautions: Back Precaution Booklet Issued: No Precaution Comments: Reviewed back precautions verbally and pt is comply with them...she has back with twisting so she avoids that movement Restrictions Weight Bearing Restrictions: No   Pertinent Vitals/Pain Pt has just received pain medicine and feels that pain is controlled      Mobility  Bed Mobility Bed Mobility: Right Sidelying to Sit;Sit to Sidelying Right Right Sidelying to Sit: 5: Supervision Sit to Sidelying Right: 5: Supervision Transfers Transfers: Sit to Stand;Stand to Sit Sit to Stand: 5: Supervision Stand to Sit: 5: Supervision Details for Transfer Assistance: pt with occasional verbal cues to push up with arms to stand. Able to repeat sit to stand x 3 with core isometrics in standing without difficulty Ambulation/Gait Ambulation/Gait Assistance: 4: Min guard Ambulation Distance (Feet): 100 Feet Assistive device: Rolling walker Ambulation/Gait Assistance Details: occasional cues to step up into RW Gait Pattern: Step-through pattern;Decreased step length - right;Decreased step length - left;Trunk flexed Gait velocity: decr General  Gait Details: pt using RW for stability Stairs: Yes Stairs Assistance: 5: Supervision Stairs Assistance Details (indicate cue type and reason): pt instructed in technique and was able to perform it. Pt pleased:  "oh yeah, I can do this!" Stair Management Technique: One rail Left;Sideways;Step to pattern Number of Stairs: 2 Wheelchair Mobility Wheelchair Mobility: No    Exercises Other Exercises Other Exercises: pt encouraged to do short bouts of activity throughout the day. Other Exercises: standing isometrics to activate core muscles   PT Diagnosis: Difficulty walking;Generalized weakness  PT Problem List: Decreased activity tolerance;Decreased mobility;Decreased strength PT Treatment Interventions: DME instruction;Gait training;Therapeutic activities;Therapeutic exercise   PT Goals Acute Rehab PT Goals PT Goal Formulation: With patient Time For Goal Achievement: 01/27/13 Potential to Achieve Goals: Good Pt will Roll Supine to Right Side: Independently PT Goal: Rolling Supine to Right Side - Progress: Goal set today Pt will Roll Supine to Left Side: Independently PT Goal: Rolling Supine to Left Side - Progress: Goal set today Pt will go Supine/Side to Sit: Independently PT Goal: Supine/Side to Sit - Progress: Goal set today Pt will go Sit to Supine/Side: Independently PT Goal: Sit to Supine/Side - Progress: Goal set today Pt will go Sit to Stand: Independently PT Goal: Sit to Stand - Progress: Goal set today Pt will go Stand to Sit: Independently PT Goal: Stand to Sit - Progress: Goal set today Pt will Ambulate: >150 feet;Independently;with least restrictive assistive device PT Goal: Ambulate - Progress: Goal set today  Visit Information  Last PT Received On: 01/27/13    Subjective Data  Subjective: "I've been walking already" Patient Stated Goal: to go home today with husband   Prior Functioning  Home Living  Lives With: Spouse Available Help at Discharge: Family Type  of Home: House Home Access: Stairs to enter Secretary/administrator of Steps: 4 Entrance Stairs-Rails: Left Home Layout: One level Home Adaptive Equipment: Walker - rolling;Bedside commode/3-in-1 (BSC over toilet) Prior Function Level of Independence: Independent with assistive device(s) Able to Take Stairs?: Yes Communication Communication: No difficulties    Cognition  Cognition Arousal/Alertness: Awake/alert Behavior During Therapy: WFL for tasks assessed/performed Overall Cognitive Status: Within Functional Limits for tasks assessed    Extremity/Trunk Assessment Right Lower Extremity Assessment RLE ROM/Strength/Tone: WFL for tasks assessed RLE Sensation: WFL - Light Touch;WFL - Proprioception Left Lower Extremity Assessment LLE ROM/Strength/Tone: WFL for tasks assessed LLE Sensation: WFL - Light Touch;WFL - Proprioception Trunk Assessment Trunk Assessment: Kyphotic;Other exceptions Trunk Exceptions: pt thin with diffuse muscle atrophy   Balance Balance Balance Assessed: Yes Static Sitting Balance Static Sitting - Balance Support: No upper extremity supported Static Sitting - Level of Assistance: 7: Independent Static Standing Balance Static Standing - Balance Support: Bilateral upper extremity supported;No upper extremity supported Static Standing - Level of Assistance: 6: Modified independent (Device/Increase time);5: Stand by assistance Static Standing - Comment/# of Minutes: 30 sec  End of Session PT - End of Session Activity Tolerance: Patient tolerated treatment well Patient left: in bed;with family/visitor present Nurse Communication: Mobility status  GP    Rosey Bath K. Manson Passey, [place] [phone] 01/27/2013, 10:34 AM

## 2013-01-27 NOTE — Anesthesia Postprocedure Evaluation (Signed)
Anesthesia Post Note  Patient: Melanie [name]  Procedure(s) Performed: Procedure(s) (LRB): LUMBAR LAMINECTOMY/DECOMPRESSION MICRODISCECTOMY 1 LEVEL (Left)  Anesthesia type: General  Patient location: PACU  Post pain: Pain level controlled and Adequate analgesia  Post assessment: Post-op Vital signs reviewed, Patient's Cardiovascular Status Stable, Respiratory Function Stable, Patent Airway and Pain level controlled  Last Vitals:  Filed Vitals:   01/27/13 0759  BP: 131/77  Pulse: 67  Temp: 37.2 C  Resp: 16    Post vital signs: Reviewed and stable  Level of consciousness: awake, alert  and oriented  Complications: No apparent anesthesia complications

## 2013-01-27 NOTE — Progress Notes (Signed)
Pt and husband given D/C instructions with Rx's, verbal understanding given. All questions answered. Pt D/C'd home via wheelchair with husband per MD order. Rema Fendt, RN

## 2013-01-27 NOTE — Evaluation (Signed)
Occupational Therapy Evaluation Patient Details Name: Melanie [name] MRN: [id number] DOB: 1924/06/15 Today's Date: 01/27/2013 Time: 0272-5366 OT Time Calculation (min): 14 min  OT Assessment / Plan / Recommendation Clinical Impression  Pt s/p Laminotomy foraminotomies microdiscectomy L4-L5.  Education completed. No DME needs.  Pt overall supervision/min guard level.  Pt reports her husband will be able to provide necessary level of assist.  No further acute OT needs.    OT Assessment  Patient does not need any further OT services    Follow Up Recommendations  No OT follow up;Supervision - Intermittent    Barriers to Discharge      Equipment Recommendations  None recommended by OT    Recommendations for Other Services    Frequency       Precautions / Restrictions Precautions Precautions: Back Precaution Booklet Issued: No Precaution Comments: Educated pt on back precautions. Restrictions Weight Bearing Restrictions: No   Pertinent Vitals/Pain See vitals    ADL  Eating/Feeding: Performed;Modified independent Where Assessed - Eating/Feeding: Edge of bed Grooming: Performed;Brushing hair;Set up Where Assessed - Grooming: Unsupported sitting Upper Body Bathing: Simulated;Set up Where Assessed - Upper Body Bathing: Unsupported sitting Lower Body Bathing: Simulated;Supervision/safety;Set up Where Assessed - Lower Body Bathing: Unsupported sit to stand Upper Body Dressing: Simulated;Set up Where Assessed - Upper Body Dressing: Unsupported sitting Lower Body Dressing: Performed;Supervision/safety;Set up Where Assessed - Lower Body Dressing: Unsupported sit to stand Toilet Transfer: Simulated;Min guard Toilet Transfer Method:  (ambulating) Acupuncturist:  (bed) Equipment Used: Gait belt Transfers/Ambulation Related to ADLs: min guard for ambulation in room.   ADL Comments: Pt able to cross ankles over knees in order to don/doff socks (R sock slightly more  difficult but pt able to complete).  Pt reports her husband will be able to assist as needed also.  Recommended pt use tub bench for showering initially for safety.       OT Diagnosis:    OT Problem List:   OT Treatment Interventions:     OT Goals    Visit Information  Last OT Received On: 01/27/13 Assistance Needed: +1    Subjective Data      Prior Functioning     Home Living Lives With: Spouse Available Help at Discharge: Family Type of Home: House Home Access: Stairs to enter Secretary/administrator of Steps: 4 Entrance Stairs-Rails: Left Home Layout: One level Bathroom Shower/Tub: Engineer, manufacturing systems: Standard Home Adaptive Equipment: Bedside commode/3-in-1;Tub transfer bench;Shower chair with back;Walker - rolling Prior Function Level of Independence: Independent with assistive device(s) Able to Take Stairs?: Yes Communication Communication: No difficulties Dominant Hand: Right         Vision/Perception     Cognition  Cognition Arousal/Alertness: Awake/alert Behavior During Therapy: WFL for tasks assessed/performed Overall Cognitive Status: Within Functional Limits for tasks assessed    Extremity/Trunk Assessment Right Upper Extremity Assessment RUE ROM/Strength/Tone: [facility] for tasks assessed Left Upper Extremity Assessment LUE ROM/Strength/Tone: WFL for tasks assessed Right Lower Extremity Assessment RLE ROM/Strength/Tone: WFL for tasks assessed RLE Sensation: WFL - Light Touch;WFL - Proprioception Left Lower Extremity Assessment LLE ROM/Strength/Tone: WFL for tasks assessed LLE Sensation: WFL - Light Touch;WFL - Proprioception Trunk Assessment Trunk Assessment: Kyphotic;Other exceptions Trunk Exceptions: pt thin with diffuse muscle atrophy     Mobility Bed Mobility Bed Mobility: Rolling Right;Right Sidelying to Sit;Sit to Sidelying Right Rolling Right: 5: Supervision Right Sidelying to Sit: 5: Supervision Sit to Sidelying Right: 5:  Supervision Details for Bed Mobility Assistance: VCs for log roll  technique. Transfers Transfers: Sit to Stand;Stand to Sit Sit to Stand: 5: Supervision;From bed;With upper extremity assist Stand to Sit: 5: Supervision;To bed;With upper extremity assist Details for Transfer Assistance: Supervision for safety.  No physical assist needed.     Exercise    Balance   End of Session OT - End of Session Equipment Utilized During Treatment: Gait belt Activity Tolerance: Patient tolerated treatment well Patient left: in bed;with call bell/phone within reach Nurse Communication: Mobility status  GO    01/27/2013 Cipriano Mile OTR/L Pager [phone] Office [phone]   Cipriano Mile 01/27/2013, 10:44 AM

## 2013-01-28 ENCOUNTER — Encounter (HOSPITAL_COMMUNITY): Payer: Self-pay | Admitting: Neurological Surgery

## 2013-01-28 NOTE — Progress Notes (Signed)
Utilization review completed. Stanely Sexson, RN, BSN. 

## 2015-02-24 ENCOUNTER — Ambulatory Visit (INDEPENDENT_AMBULATORY_CARE_PROVIDER_SITE_OTHER): Payer: Medicare Other | Admitting: Cardiovascular Disease

## 2015-02-24 ENCOUNTER — Encounter: Payer: Self-pay | Admitting: Cardiovascular Disease

## 2015-02-24 VITALS — BP 120/62 | HR 54 | Ht 59.0 in | Wt 116.8 lb

## 2015-02-24 DIAGNOSIS — I340 Nonrheumatic mitral (valve) insufficiency: Secondary | ICD-10-CM | POA: Diagnosis not present

## 2015-02-24 NOTE — Patient Instructions (Signed)
Medication Instructions:  Your physician recommends that you continue on your current medications as directed. Please refer to the Current Medication list given to you today.   Labwork: None Ordered   Testing/Procedures: None Ordered   Follow-Up: Your physician recommends that you schedule a follow-up appointment in:  As needed with Dr. Elease HashimotoNahser

## 2015-02-24 NOTE — Progress Notes (Signed)
Cardiology Office Note   Date:  02/24/2015   ID:  Melanie [name], DOB 12/25/1923, MRN [id number]  PCP:  No PCP Per Patient  Cardiologist:   Vesta MixerNahser, Ariyon Gerstenberger J, MD   Chief Complaint  Patient presents with  . New Evaluation    heart murmur   Problem List 1. Mitral regurgitation  2. Back pains  3. Mild left carotid artery artery disease    History of Present Illness: Melanie [name] is a 81 y.o. female who presents for evaluation of a new heart murmur .  She reports that she is here for a regular check up - no CP or dyspnea.  She used to see Dr. Ricki MillerPang  ( [place]  Brought a record of her BP and HR readings - all look very good.   She has had an echo in Dr. Jodi MarbleGangi's office. Normal LV function . Possible PFO. , trace AI  Mild MR  BP and HR is very well controlled    Past Medical History  Diagnosis Date  . Diverticulosis   . Schatzki's ring   . Hemorrhoids   . Carotid stenosis   . Scoliosis   . Insomnia   . Esophageal stricture   . Hypertension     takes Amlodipine,HCTZ,and Metoprolol  . Heart murmur     as a child  . Chronic back pain     stenosis  . Bruises easily     on Aspirin;on hold for surgery  . GERD (gastroesophageal reflux disease)     takes Protonix daily  . History of colon polyps   . Peripheral edema   . Chronic sinusitis     Past Surgical History  Procedure Laterality Date  . Cholecystectomy    . Dilation and curettage of uterus    . Breast enhancement surgery    . Total hip arthroplasty Right   . Colonoscopy    . Esophagogastroduodenoscopy    . Eye surgery      left cataract removed  . Epidural injections    . Steroid injections      x 3  . Lumbar laminectomy/decompression microdiscectomy Left 01/26/2013    Procedure: LUMBAR LAMINECTOMY/DECOMPRESSION MICRODISCECTOMY 1 LEVEL;  Surgeon: Barnett AbuHenry [name], MD;  Location: [facility];  Service: Neurosurgery;  Laterality: Left;  Left Lumbar four-five Laminectomy/Foraminotomy      Current Outpatient Prescriptions  Medication Sig Dispense Refill  . amLODipine (NORVASC) 5 MG tablet Take 5 mg by mouth daily.    Marland Kitchen. aspirin EC 81 MG tablet Take 81 mg by mouth daily.    . Cholecalciferol (VITAMIN D3) 2000 UNITS TABS Take 1 tablet by mouth daily.    . diazepam (VALIUM) 5 MG tablet Take 1 tablet (5 mg total) by mouth every 6 (six) hours as needed (muscle spasm). 30 tablet 0  . Glucosamine 500 MG CAPS Take 1 capsule by mouth daily as needed (for joints).     . hydrochlorothiazide (HYDRODIURIL) 25 MG tablet Take 25 mg by mouth daily.    Marland Kitchen. lidocaine (LIDODERM) 5 % Place 1 patch onto the skin as needed (for pain.). Remove & Discard patch within 12 hours or as directed by MD    . metoprolol succinate (TOPROL-XL) 50 MG 24 hr tablet Take 50 mg by mouth daily. Take with or immediately following a meal.    . Multiple Vitamin (MULTIVITAMIN) tablet Take 1 tablet by mouth daily.    Marland Kitchen. oxyCODONE-acetaminophen (PERCOCET/ROXICET) 5-325 MG per tablet Take 1-2 tablets by  mouth every 4 (four) hours as needed. (Patient not taking: Reported on 02/24/2015) 30 tablet 0  . pantoprazole (PROTONIX) 40 MG tablet Take 40 mg by mouth daily.     No current facility-administered medications for this visit.    Allergies:   Review of patient's allergies indicates no known allergies.    Social History:  The patient  reports that she has never smoked. She has never used smokeless tobacco. She reports that she does not drink alcohol or use illicit drugs.   Family History:  The patient's family history includes CAD in her brother; Lung cancer in her brother; Renal cancer in her sister; Rheum arthritis in her mother.    ROS:  Please see the history of present illness.    Review of Systems: Constitutional:  denies fever, chills, diaphoresis, appetite change and fatigue.  HEENT: denies photophobia, eye pain, redness, hearing loss, ear pain, congestion, sore throat, rhinorrhea, sneezing, neck pain, neck  stiffness and tinnitus.  Respiratory: denies SOB, DOE, cough, chest tightness, and wheezing.  Cardiovascular: denies chest pain, palpitations and leg swelling.  Gastrointestinal: denies nausea, vomiting, abdominal pain, diarrhea, constipation, blood in stool.  Genitourinary: denies dysuria, urgency, frequency, hematuria, flank pain and difficulty urinating.  Musculoskeletal: denies  myalgias, back pain, joint swelling, arthralgias and gait problem.   Skin: denies pallor, rash and wound.  Neurological: denies dizziness, seizures, syncope, weakness, light-headedness, numbness and headaches.   Hematological: denies adenopathy, easy bruising, personal or family bleeding history.  Psychiatric/ Behavioral: denies suicidal ideation, mood changes, confusion, nervousness, sleep disturbance and agitation.       All other systems are reviewed and negative.    PHYSICAL EXAM: VS:  BP 120/62 mmHg  Pulse 54  Ht  (1.499 m)  Wt 52.98 kg (116 lb 12.8 oz)  BMI 23.58 kg/m2 , BMI Body mass index is 23.58 kg/(m^2). GEN: Well nourished, well developed, in no acute distress HEENT: normal Neck: no JVD, carotid bruits, or masses Cardiac: RRR; 1-2/6 systolic  murmur,  No rubs, or gallops,no edema  Respiratory:  clear to auscultation bilaterally, normal work of breathing GI: soft, nontender, nondistended, + BS MS: no deformity or atrophy Skin: warm and dry, no rash Neuro:  Strength and sensation are intact Psych: normal   EKG:  EKG is ordered today. The ekg ordered today demonstrates sinus brady at 54. Possible LAE    Recent Labs: No results found for requested labs within last 365 days.    Lipid Panel No results found for: CHOL, TRIG, HDL, CHOLHDL, VLDL, LDLCALC, LDLDIRECT    Wt Readings from Last 3 Encounters:  02/24/15 52.98 kg (116 lb 12.8 oz)  01/22/13 51.256 kg (113 lb)  10/19/12 52.98 kg (116 lb 12.8 oz)      Other studies Reviewed: Additional studies/ records that were  reviewed today include: . Review of the above records demonstrates:    ASSESSMENT AND PLAN: . 1. Mitral regurgitation  - very stable , mild MR by echo. No further follow up needed   2. Back pains    3. Mild left carotid artery artery disease   Current medicines are reviewed at length with the patient today.  The patient does not have concerns regarding medicines.  The following changes have been made:  no change  Labs/ tests ordered today include:  No orders of the defined types were placed in this encounter.     Disposition:   FU with me as needed.     Aesha Agrawal, Deloris Ping, MD  02/24/2015 9:17 AM    [facility] [address] [place], [place], [place]  [address] Phone: [phone]; Fax: [phone]   [facility]  [address] [place], [place]  [address] [phone]    Fax [phone]

## 2015-03-10 ENCOUNTER — Encounter: Payer: Self-pay | Admitting: Cardiovascular Disease

## 2015-11-06 ENCOUNTER — Other Ambulatory Visit (HOSPITAL_COMMUNITY): Payer: Self-pay | Admitting: Internal Medicine

## 2015-11-06 ENCOUNTER — Other Ambulatory Visit (HOSPITAL_COMMUNITY): Payer: Self-pay | Admitting: Registered Nurse

## 2015-11-06 ENCOUNTER — Ambulatory Visit (HOSPITAL_COMMUNITY)
Admission: RE | Admit: 2015-11-06 | Discharge: 2015-11-06 | Disposition: A | Discharge status: Home / Self Care | Payer: Medicare Other | Source: Ambulatory Visit | Attending: Surgery | Admitting: Surgery

## 2015-11-06 DIAGNOSIS — M79662 Pain in left lower leg: Secondary | ICD-10-CM | POA: Insufficient documentation

## 2015-11-06 DIAGNOSIS — I10 Essential (primary) hypertension: Secondary | ICD-10-CM | POA: Diagnosis not present

## 2015-11-06 DIAGNOSIS — I700 Atherosclerosis of aorta: Secondary | ICD-10-CM | POA: Insufficient documentation

## 2015-11-27 ENCOUNTER — Other Ambulatory Visit: Payer: Self-pay | Admitting: Internal Medicine

## 2016-01-18 ENCOUNTER — Ambulatory Visit (HOSPITAL_COMMUNITY): Payer: Medicare Other

## 2016-02-16 ENCOUNTER — Ambulatory Visit (HOSPITAL_COMMUNITY)
Admission: RE | Admit: 2016-02-16 | Discharge: 2016-02-16 | Disposition: A | Discharge status: Home / Self Care | Payer: Medicare Other | Source: Ambulatory Visit | Attending: Vascular Surgery | Admitting: Vascular Surgery

## 2016-02-16 ENCOUNTER — Encounter (INDEPENDENT_AMBULATORY_CARE_PROVIDER_SITE_OTHER): Payer: Self-pay

## 2016-02-16 ENCOUNTER — Other Ambulatory Visit (HOSPITAL_COMMUNITY): Payer: Self-pay | Admitting: Internal Medicine

## 2016-02-16 DIAGNOSIS — K219 Gastro-esophageal reflux disease without esophagitis: Secondary | ICD-10-CM | POA: Diagnosis not present

## 2016-02-16 DIAGNOSIS — I6522 Occlusion and stenosis of left carotid artery: Secondary | ICD-10-CM | POA: Diagnosis not present

## 2016-02-16 DIAGNOSIS — I10 Essential (primary) hypertension: Secondary | ICD-10-CM | POA: Insufficient documentation

## 2016-02-16 DIAGNOSIS — I6523 Occlusion and stenosis of bilateral carotid arteries: Secondary | ICD-10-CM | POA: Diagnosis not present

## 2016-02-16 LAB — VAS US CAROTID
LCCADDIAS: -10 cm/s
LCCADSYS: -51 cm/s
LCCAPDIAS: 14 cm/s
LCCAPSYS: 84 cm/s
LEFT ECA DIAS: 4 cm/s
LEFT VERTEBRAL DIAS: -10 cm/s
LICADDIAS: -15 cm/s
LICADSYS: -77 cm/s
LICAPSYS: -59 cm/s
Left ICA prox dias: -11 cm/s
RCCAPSYS: 60 cm/s
RIGHT CCA MID DIAS: 11 cm/s
RIGHT ECA DIAS: -7 cm/s
RIGHT VERTEBRAL DIAS: 14 cm/s
Right CCA prox dias: 6 cm/s
Right cca dist sys: -93 cm/s

## 2016-05-16 ENCOUNTER — Ambulatory Visit
Admission: RE | Admit: 2016-05-16 | Discharge: 2016-05-16 | Disposition: A | Discharge status: Home / Self Care | Payer: Medicare Other | Source: Ambulatory Visit | Attending: Internal Medicine | Admitting: Internal Medicine

## 2016-05-16 ENCOUNTER — Other Ambulatory Visit: Payer: Self-pay | Admitting: Internal Medicine

## 2016-05-16 DIAGNOSIS — R1031 Right lower quadrant pain: Secondary | ICD-10-CM

## 2016-05-16 MED ORDER — IOPAMIDOL (ISOVUE-300) INJECTION 61%
100.0000 mL | Freq: Once | INTRAVENOUS | Status: AC | PRN
Start: 2016-05-16 — End: 2016-05-16
  Administered 2016-05-16: 100 mL via INTRAVENOUS

## 2016-07-11 ENCOUNTER — Ambulatory Visit (INDEPENDENT_AMBULATORY_CARE_PROVIDER_SITE_OTHER): Payer: Medicare Other | Admitting: Cardiovascular Disease

## 2016-07-11 ENCOUNTER — Encounter: Payer: Self-pay | Admitting: Cardiovascular Disease

## 2016-07-11 VITALS — BP 132/66 | HR 75 | Ht 59.0 in | Wt 108.8 lb

## 2016-07-11 DIAGNOSIS — I340 Nonrheumatic mitral (valve) insufficiency: Secondary | ICD-10-CM | POA: Diagnosis not present

## 2016-07-11 DIAGNOSIS — R011 Cardiac murmur, unspecified: Secondary | ICD-10-CM

## 2016-07-11 NOTE — Patient Instructions (Signed)

## 2016-07-11 NOTE — Progress Notes (Signed)
Cardiology Office Note   Date:  07/11/2016   ID:  Melanie LaityGladys R [name], DOB 07/18/1924, MRN [id number]  PCP:  Gwen PoundsUSSO,JOHN M, MD  Cardiologist:   Kristeen MissPhilip Sanda Dejoy, MD   Chief Complaint  Patient presents with  . Palpitations  . Edema   Problem List 1. Mitral regurgitation  2. Back pains  3. Mild left carotid artery artery disease    History of Present Illness: Melanie [name] is a 82 y.o. female who presents for evaluation of a new heart murmur .  She reports that she is here for a regular check up - no CP or dyspnea.  She used to see Dr. Ricki MillerPang  ( moved to [place], [place])  Brought a record of her BP and HR readings - all look very good.   She has had an echo in Dr. Jodi MarbleGangi's office. Normal LV function . Possible PFO. , trace AI  Mild MR  BP and HR is very well controlled  Nov. 16, 2017:     Melanie [name] is seen back for a follow up visit after a year.  Her husband passed away this past May.   Awoke with some heart flutters ,   BP was a little elevated BP is great .    Past Medical History:  Diagnosis Date  . Bruises easily    on Aspirin;on hold for surgery  . Carotid stenosis   . Chronic back pain    stenosis  . Chronic sinusitis   . Diverticulosis   . Esophageal stricture   . GERD (gastroesophageal reflux disease)    takes Protonix daily  . Heart murmur    as a child  . Hemorrhoids   . History of colon polyps   . Hypertension    takes Amlodipine,HCTZ,and Metoprolol  . Insomnia   . Peripheral edema   . Schatzki's ring   . Scoliosis     Past Surgical History:  Procedure Laterality Date  . BREAST ENHANCEMENT SURGERY    . CHOLECYSTECTOMY    . COLONOSCOPY    . DILATION AND CURETTAGE OF UTERUS    . epidural injections    . ESOPHAGOGASTRODUODENOSCOPY    . EYE SURGERY     left cataract removed  . LUMBAR LAMINECTOMY/DECOMPRESSION MICRODISCECTOMY Left 01/26/2013   Procedure: LUMBAR LAMINECTOMY/DECOMPRESSION MICRODISCECTOMY 1 LEVEL;  Surgeon: Barnett AbuHenry [name], MD;   Location: [facility];  Service: Neurosurgery;  Laterality: Left;  Left Lumbar four-five Laminectomy/Foraminotomy  . steroid injections     x 3  . TOTAL HIP ARTHROPLASTY Right      Current Outpatient Prescriptions  Medication Sig Dispense Refill  . amLODipine (NORVASC) 5 MG tablet Take 5 mg by mouth daily.    Marland Kitchen. aspirin EC 81 MG tablet Take 81 mg by mouth daily.    . Cholecalciferol (VITAMIN D3) 2000 UNITS TABS Take 1 tablet by mouth daily.    . diazepam (VALIUM) 5 MG tablet Take 1 tablet (5 mg total) by mouth every 6 (six) hours as needed (muscle spasm). 30 tablet 0  . FLUZONE HIGH-DOSE 0.5 ML SUSY Inject as directed once.    . Glucosamine 500 MG CAPS Take 1 capsule by mouth daily as needed (for joints).     . hydrochlorothiazide (HYDRODIURIL) 25 MG tablet Take 25 mg by mouth daily.    Marland Kitchen. lidocaine (LIDODERM) 5 % Place 1 patch onto the skin as needed (for pain.). Remove & Discard patch within 12 hours or as directed by MD    . metoprolol  succinate (TOPROL-XL) 50 MG 24 hr tablet Take 50 mg by mouth daily. Take with or immediately following a meal.    . Multiple Vitamin (MULTIVITAMIN) tablet Take 1 tablet by mouth daily.    . pantoprazole (PROTONIX) 40 MG tablet Take 40 mg by mouth daily.    Marland Kitchen oxyCODONE-acetaminophen (PERCOCET/ROXICET) 5-325 MG per tablet Take 1-2 tablets by mouth every 4 (four) hours as needed. (Patient not taking: Reported on 07/11/2016) 30 tablet 0   No current facility-administered medications for this visit.     Allergies:   Patient has no known allergies.    Social History:  The patient  reports that she has never smoked. She has never used smokeless tobacco. She reports that she does not drink alcohol or use drugs.   Family History:  The patient's family history includes CAD in her brother; Lung cancer in her brother; Renal cancer in her sister; Rheum arthritis in her mother.    ROS:  Please see the history of present illness.    Review of  Systems: Constitutional:  denies fever, chills, diaphoresis, appetite change and fatigue.  HEENT: denies photophobia, eye pain, redness, hearing loss, ear pain, congestion, sore throat, rhinorrhea, sneezing, neck pain, neck stiffness and tinnitus.  Respiratory: denies SOB, DOE, cough, chest tightness, and wheezing.  Cardiovascular: denies chest pain, palpitations and leg swelling.  Gastrointestinal: denies nausea, vomiting, abdominal pain, diarrhea, constipation, blood in stool.  Genitourinary: denies dysuria, urgency, frequency, hematuria, flank pain and difficulty urinating.  Musculoskeletal: denies  myalgias, back pain, joint swelling, arthralgias and gait problem.   Skin: denies pallor, rash and wound.  Neurological: denies dizziness, seizures, syncope, weakness, light-headedness, numbness and headaches.   Hematological: denies adenopathy, easy bruising, personal or family bleeding history.  Psychiatric/ Behavioral: denies suicidal ideation, mood changes, confusion, nervousness, sleep disturbance and agitation.       All other systems are reviewed and negative.    PHYSICAL EXAM: VS:  BP 132/66 (BP Location: Left Arm, Patient Position: Sitting, Cuff Size: Normal)   Pulse 75   Ht 4\' 11"  (1.499 m)   Wt 108 lb 12.8 oz (49.4 kg)   BMI 21.97 kg/m  , BMI Body mass index is 21.97 kg/m. GEN: Well nourished, well developed, in no acute distress  HEENT: normal  Neck: no JVD, carotid bruits, or masses Cardiac: RRR; 1-2/6 systolic  murmur,  No rubs, or gallops,no edema  Respiratory:  clear to auscultation bilaterally, normal work of breathing GI: soft, nontender, nondistended, + BS MS: no deformity or atrophy  Skin: warm and dry, no rash Neuro:  Strength and sensation are intact Psych: normal   EKG:  EKG is ordered today. The ekg ordered today demonstrates  NSR at 75.   LAE,  No ST or T wave changes  Recent Labs: No results found for requested labs within last 8760 hours.    Lipid  Panel No results found for: CHOL, TRIG, HDL, CHOLHDL, VLDL, LDLCALC, LDLDIRECT    Wt Readings from Last 3 Encounters:  07/11/16 108 lb 12.8 oz (49.4 kg)  02/24/15 116 lb 12.8 oz (53 kg)  01/22/13 113 lb (51.3 kg)      Other studies Reviewed: Additional studies/ records that were reviewed today include: . Review of the above records demonstrates:    ASSESSMENT AND PLAN: . 1. Mitral regurgitation  - very stable , mild MR by echo. No further follow up needed   2. Back pains    3. Mild left carotid artery artery disease  4.  Stress/depression. His regular still getting over the death of her husband. She explained the mechanism of his death. He apparently fell and entangled in his oxygen tubing. His head was pulled down and he was not able to breathe. She is a fairly small lady and he was a large man and she was not able to pull his head up in order to allow him to breathe. She called EMS.  He took them some time to free him from the oxygen cord and get him into a position where they could provide CPR. They were successful in restarting his heart. He was brought to the emergency room where he was found have stable cardiac and Pulmozyme status but had severe anoxic brain injury. He lived for 2 more days.  Melanie [name] brought a form for an accident insurance policy that I filled out. I did not see him during that hospitalization but she provided me the records. Dr. Marita Snellenekesh Alva saw him during that hospitalization   Current medicines are reviewed at length with the patient today.  The patient does not have concerns regarding medicines.  The following changes have been made:  no change  Labs/ tests ordered today include:  No orders of the defined types were placed in this encounter.    Disposition:   FU with me in one year.    Kristeen MissPhilip Jenness Stemler, MD  07/11/2016 9:46 AM    [facility] [address], [place], [place]  [address] Phone: [phone]; Fax: [phone]    [facility]  [address] [place], [place]  [address] [phone]    Fax [phone]

## 2016-07-17 ENCOUNTER — Emergency Department (HOSPITAL_COMMUNITY): Payer: Medicare Other

## 2016-07-17 ENCOUNTER — Inpatient Hospital Stay (HOSPITAL_COMMUNITY)
Admission: EM | Admit: 2016-07-17 | Discharge: 2016-07-19 | DRG: 065 | Disposition: A | Discharge status: Home Health | Payer: Medicare Other | Attending: Internal Medicine | Admitting: Internal Medicine

## 2016-07-17 ENCOUNTER — Encounter (HOSPITAL_COMMUNITY): Payer: Self-pay | Admitting: Emergency Medicine

## 2016-07-17 DIAGNOSIS — Z8051 Family history of malignant neoplasm of kidney: Secondary | ICD-10-CM

## 2016-07-17 DIAGNOSIS — Z96641 Presence of right artificial hip joint: Secondary | ICD-10-CM | POA: Diagnosis present

## 2016-07-17 DIAGNOSIS — T502X5A Adverse effect of carbonic-anhydrase inhibitors, benzothiadiazides and other diuretics, initial encounter: Secondary | ICD-10-CM | POA: Diagnosis present

## 2016-07-17 DIAGNOSIS — E876 Hypokalemia: Secondary | ICD-10-CM | POA: Diagnosis present

## 2016-07-17 DIAGNOSIS — I340 Nonrheumatic mitral (valve) insufficiency: Secondary | ICD-10-CM | POA: Diagnosis present

## 2016-07-17 DIAGNOSIS — I4891 Unspecified atrial fibrillation: Secondary | ICD-10-CM | POA: Diagnosis present

## 2016-07-17 DIAGNOSIS — G451 Carotid artery syndrome (hemispheric): Secondary | ICD-10-CM | POA: Diagnosis not present

## 2016-07-17 DIAGNOSIS — I63412 Cerebral infarction due to embolism of left middle cerebral artery: Secondary | ICD-10-CM | POA: Diagnosis not present

## 2016-07-17 DIAGNOSIS — I10 Essential (primary) hypertension: Secondary | ICD-10-CM | POA: Diagnosis not present

## 2016-07-17 DIAGNOSIS — Z8261 Family history of arthritis: Secondary | ICD-10-CM

## 2016-07-17 DIAGNOSIS — Z79891 Long term (current) use of opiate analgesic: Secondary | ICD-10-CM

## 2016-07-17 DIAGNOSIS — K5790 Diverticulosis of intestine, part unspecified, without perforation or abscess without bleeding: Secondary | ICD-10-CM | POA: Diagnosis present

## 2016-07-17 DIAGNOSIS — Z79899 Other long term (current) drug therapy: Secondary | ICD-10-CM

## 2016-07-17 DIAGNOSIS — R29700 NIHSS score 0: Secondary | ICD-10-CM | POA: Diagnosis present

## 2016-07-17 DIAGNOSIS — Z7982 Long term (current) use of aspirin: Secondary | ICD-10-CM

## 2016-07-17 DIAGNOSIS — R4182 Altered mental status, unspecified: Secondary | ICD-10-CM | POA: Diagnosis not present

## 2016-07-17 DIAGNOSIS — Z9842 Cataract extraction status, left eye: Secondary | ICD-10-CM

## 2016-07-17 DIAGNOSIS — G459 Transient cerebral ischemic attack, unspecified: Secondary | ICD-10-CM

## 2016-07-17 DIAGNOSIS — E785 Hyperlipidemia, unspecified: Secondary | ICD-10-CM

## 2016-07-17 DIAGNOSIS — Z981 Arthrodesis status: Secondary | ICD-10-CM

## 2016-07-17 DIAGNOSIS — K222 Esophageal obstruction: Secondary | ICD-10-CM | POA: Diagnosis present

## 2016-07-17 DIAGNOSIS — Z8249 Family history of ischemic heart disease and other diseases of the circulatory system: Secondary | ICD-10-CM

## 2016-07-17 DIAGNOSIS — R40243 Glasgow coma scale score 3-8, unspecified time: Secondary | ICD-10-CM | POA: Diagnosis present

## 2016-07-17 DIAGNOSIS — R002 Palpitations: Secondary | ICD-10-CM

## 2016-07-17 DIAGNOSIS — Z86010 Personal history of colonic polyps: Secondary | ICD-10-CM

## 2016-07-17 DIAGNOSIS — Z801 Family history of malignant neoplasm of trachea, bronchus and lung: Secondary | ICD-10-CM

## 2016-07-17 DIAGNOSIS — K219 Gastro-esophageal reflux disease without esophagitis: Secondary | ICD-10-CM | POA: Diagnosis present

## 2016-07-17 DIAGNOSIS — R011 Cardiac murmur, unspecified: Secondary | ICD-10-CM | POA: Diagnosis present

## 2016-07-17 DIAGNOSIS — I639 Cerebral infarction, unspecified: Secondary | ICD-10-CM | POA: Diagnosis present

## 2016-07-17 DIAGNOSIS — Z8673 Personal history of transient ischemic attack (TIA), and cerebral infarction without residual deficits: Secondary | ICD-10-CM

## 2016-07-17 LAB — COMPREHENSIVE METABOLIC PANEL
ALT: 11 U/L — AB (ref 14–54)
ANION GAP: 9 (ref 5–15)
AST: 24 U/L (ref 15–41)
Albumin: 4.3 g/dL (ref 3.5–5.0)
Alkaline Phosphatase: 83 U/L (ref 38–126)
BUN: 14 mg/dL (ref 6–20)
CHLORIDE: 101 mmol/L (ref 101–111)
CO2: 29 mmol/L (ref 22–32)
CREATININE: 0.96 mg/dL (ref 0.44–1.00)
Calcium: 10 mg/dL (ref 8.9–10.3)
GFR calc non Af Amer: 51 mL/min — ABNORMAL LOW (ref >60)
GFR, EST AFRICAN AMERICAN: 59 mL/min — AB (ref >60)
Glucose, Bld: 109 mg/dL — ABNORMAL HIGH (ref 65–99)
POTASSIUM: 3.3 mmol/L — AB (ref 3.5–5.1)
SODIUM: 139 mmol/L (ref 135–145)
Total Bilirubin: 0.8 mg/dL (ref 0.3–1.2)
Total Protein: 7 g/dL (ref 6.5–8.1)

## 2016-07-17 LAB — CBC
HCT: 39.8 % (ref 36.0–46.0)
Hemoglobin: 13.6 g/dL (ref 12.0–15.0)
MCH: 31.8 pg (ref 26.0–34.0)
MCHC: 34.2 g/dL (ref 30.0–36.0)
MCV: 93 fL (ref 78.0–100.0)
PLATELETS: 246 10*3/uL (ref 150–400)
RBC: 4.28 MIL/uL (ref 3.87–5.11)
RDW: 13.7 % (ref 11.5–15.5)
WBC: 8.3 10*3/uL (ref 4.0–10.5)

## 2016-07-17 LAB — URINALYSIS, ROUTINE W REFLEX MICROSCOPIC
BILIRUBIN URINE: NEGATIVE
GLUCOSE, UA: NEGATIVE mg/dL
HGB URINE DIPSTICK: NEGATIVE
Ketones, ur: NEGATIVE mg/dL
Nitrite: NEGATIVE
PH: 6 (ref 5.0–8.0)
Protein, ur: NEGATIVE mg/dL
SPECIFIC GRAVITY, URINE: 1.008 (ref 1.005–1.030)

## 2016-07-17 LAB — DIFFERENTIAL
BASOS ABS: 0 10*3/uL (ref 0.0–0.1)
BASOS PCT: 0 %
Eosinophils Absolute: 0.2 10*3/uL (ref 0.0–0.7)
Eosinophils Relative: 2 %
Lymphocytes Relative: 23 %
Lymphs Abs: 1.9 10*3/uL (ref 0.7–4.0)
MONO ABS: 0.7 10*3/uL (ref 0.1–1.0)
Monocytes Relative: 8 %
NEUTROS ABS: 5.5 10*3/uL (ref 1.7–7.7)
Neutrophils Relative %: 67 %

## 2016-07-17 LAB — CBG MONITORING, ED: Glucose-Capillary: 103 mg/dL — ABNORMAL HIGH (ref 65–99)

## 2016-07-17 LAB — I-STAT CHEM 8, ED
BUN: 14 mg/dL (ref 6–20)
CHLORIDE: 100 mmol/L — AB (ref 101–111)
Calcium, Ion: 1.2 mmol/L (ref 1.15–1.40)
Creatinine, Ser: 0.9 mg/dL (ref 0.44–1.00)
Glucose, Bld: 105 mg/dL — ABNORMAL HIGH (ref 65–99)
HEMATOCRIT: 43 % (ref 36.0–46.0)
Hemoglobin: 14.6 g/dL (ref 12.0–15.0)
POTASSIUM: 2.9 mmol/L — AB (ref 3.5–5.1)
SODIUM: 140 mmol/L (ref 135–145)
TCO2: 30 mmol/L (ref 0–100)

## 2016-07-17 LAB — MAGNESIUM: Magnesium: 2.2 mg/dL (ref 1.7–2.4)

## 2016-07-17 LAB — PROTIME-INR
INR: 0.88
PROTHROMBIN TIME: 11.9 s (ref 11.4–15.2)

## 2016-07-17 LAB — RAPID URINE DRUG SCREEN, HOSP PERFORMED
AMPHETAMINES: NOT DETECTED
BENZODIAZEPINES: NOT DETECTED
Barbiturates: NOT DETECTED
COCAINE: NOT DETECTED
OPIATES: NOT DETECTED
Tetrahydrocannabinol: NOT DETECTED

## 2016-07-17 LAB — URINE MICROSCOPIC-ADD ON
BACTERIA UA: NONE SEEN
RBC / HPF: NONE SEEN RBC/hpf (ref 0–5)
SQUAMOUS EPITHELIAL / LPF: NONE SEEN

## 2016-07-17 LAB — I-STAT TROPONIN, ED: Troponin i, poc: 0.02 ng/mL (ref 0.00–0.08)

## 2016-07-17 LAB — APTT: APTT: 29 s (ref 24–36)

## 2016-07-17 LAB — ETHANOL

## 2016-07-17 MED ORDER — ACETAMINOPHEN 325 MG PO TABS: 650.0000 mg | ORAL_TABLET | ORAL | Status: DC | PRN

## 2016-07-17 MED ORDER — HYDROCHLOROTHIAZIDE 25 MG PO TABS: 25.0000 mg | ORAL_TABLET | Freq: Every day | ORAL | Status: DC

## 2016-07-17 MED ORDER — METOPROLOL SUCCINATE ER 25 MG PO TB24
50.0000 mg | ORAL_TABLET | Freq: Every evening | ORAL | Status: DC
  Administered 2016-07-17: 50 mg via ORAL
  Filled 2016-07-17 (×2): qty 2

## 2016-07-17 MED ORDER — STROKE: EARLY STAGES OF RECOVERY BOOK
Freq: Once | Status: AC
  Administered 2016-07-18: 01:00:00
  Filled 2016-07-17 (×2): qty 1

## 2016-07-17 MED ORDER — ASPIRIN 325 MG PO TABS
325.0000 mg | ORAL_TABLET | Freq: Every day | ORAL | Status: DC
  Administered 2016-07-18 – 2016-07-19 (×2): 325 mg via ORAL
  Filled 2016-07-17 (×2): qty 1

## 2016-07-17 MED ORDER — ENOXAPARIN SODIUM 40 MG/0.4ML ~~LOC~~ SOLN
40.0000 mg | SUBCUTANEOUS | Status: DC
  Administered 2016-07-17: 40 mg via SUBCUTANEOUS
  Filled 2016-07-17: qty 0.4

## 2016-07-17 MED ORDER — AMLODIPINE BESYLATE 2.5 MG PO TABS
2.5000 mg | ORAL_TABLET | Freq: Two times a day (BID) | ORAL | Status: DC
  Administered 2016-07-17: 2.5 mg via ORAL
  Filled 2016-07-17: qty 1

## 2016-07-17 MED ORDER — ASPIRIN 300 MG RE SUPP: 300.0000 mg | Freq: Every day | RECTAL | Status: DC

## 2016-07-17 MED ORDER — DIAZEPAM 2 MG PO TABS: 2.0000 mg | ORAL_TABLET | Freq: Once | ORAL | Status: DC | PRN

## 2016-07-17 MED ORDER — ACETAMINOPHEN 650 MG RE SUPP: 650.0000 mg | RECTAL | Status: DC | PRN

## 2016-07-17 MED ORDER — POTASSIUM CHLORIDE CRYS ER 20 MEQ PO TBCR
40.0000 meq | EXTENDED_RELEASE_TABLET | Freq: Once | ORAL | Status: AC
  Administered 2016-07-17: 40 meq via ORAL
  Filled 2016-07-17: qty 2

## 2016-07-17 MED ORDER — PANTOPRAZOLE SODIUM 40 MG PO TBEC
40.0000 mg | DELAYED_RELEASE_TABLET | Freq: Every day | ORAL | Status: DC
  Administered 2016-07-18 – 2016-07-19 (×2): 40 mg via ORAL
  Filled 2016-07-17 (×2): qty 1

## 2016-07-17 NOTE — ED Notes (Signed)
Patient transported to X-ray 

## 2016-07-17 NOTE — ED Notes (Signed)
[facility] notified of need for transport 

## 2016-07-17 NOTE — ED Notes (Signed)
[facility] reports a delay in transport due to no trucks available

## 2016-07-17 NOTE — H&P (Signed)
History and Physical  Patient Name: Melanie [name]     ZOX:[id number]    DOB: [birth year]    DOA: 07/17/2016 PCP: Gwen Pounds, MD   Patient coming from: Home     Chief Complaint: Memory issue and numbness  HPI: Melanie [name] is a 82 y.o. female with a past medical history significant for HTN and mitral regurg and mild carotid artery disease who presents with transient aphasia and numbness.  Over the last 6 weeks, the patient has had several "tiny episodes" lasting 2-3 minutes at a time, where" words don't come out" even though she can think of words. This has happened 2-3 times.  Then today, she drove herself to [facility], but when she walked in she realized she couldn't remember what she was therefore. Then she tried to call her daughter, but couldn't remove the number even though she dilated every day. She felt a vague malaise, and asked a bystander to call her home aide, and her home aide picked her up and brought her to the ER.    During the episode, she had unilateral left-sided numbness and perhaps "blurry vision", but no weakness, slurred speech, syncope, gait difficulties, facial asymmetry. All of her symptoms had resolved at time of arrival in the ER.  ED course: -Afebrile, heart rate 60s, respirations and pulse is normal, blood pressure 154/69 -Na 139, K 3.3, Cr 0.96, WBC 8.3K, Hgb 13 -Alcohol negative, coags normal, urine drug screen negative -Troponin negative -CT head unremarkable for age -Urinalysis with mild pyuria -ECG showed sinus rhythm -Chest x-ray clear -The case was discussed with neurology who recommended TIA workup, and TRH was asked to evaluate for admission     Review of systems:  Review of Systems  Constitutional: Negative for chills and fever.  Eyes: Positive for blurred vision. Negative for double vision.  Neurological: Positive for sensory change. Negative for dizziness, tingling, tremors, speech change, focal weakness, seizures, loss of  consciousness, weakness and headaches.  All other systems reviewed and are negative.        Past Medical History:  Diagnosis Date  . Bruises easily    on Aspirin;on hold for surgery  . Carotid stenosis   . Chronic back pain    stenosis  . Chronic sinusitis   . Diverticulosis   . Esophageal stricture   . GERD (gastroesophageal reflux disease)    takes Protonix daily  . Heart murmur    as a child  . Hemorrhoids   . History of colon polyps   . Hypertension    takes Amlodipine,HCTZ,and Metoprolol  . Insomnia   . Peripheral edema   . Schatzki's ring   . Scoliosis     Past Surgical History:  Procedure Laterality Date  . BREAST ENHANCEMENT SURGERY    . CHOLECYSTECTOMY    . COLONOSCOPY    . DILATION AND CURETTAGE OF UTERUS    . epidural injections    . ESOPHAGOGASTRODUODENOSCOPY    . EYE SURGERY     left cataract removed  . LUMBAR LAMINECTOMY/DECOMPRESSION MICRODISCECTOMY Left 01/26/2013   Procedure: LUMBAR LAMINECTOMY/DECOMPRESSION MICRODISCECTOMY 1 LEVEL;  Surgeon: Barnett Abu, MD;  Location: [facility];  Service: Neurosurgery;  Laterality: Left;  Left Lumbar four-five Laminectomy/Foraminotomy  . steroid injections     x 3  . TOTAL HIP ARTHROPLASTY Right     Social History: Patient lives alone.  Her husband passed in May when he became tangled in his oxygen tubing and collapsed and she was unable to lift  his head and EMS were called, he got CPR but resulted in anoxic brain injury.  Patient walks unassisted.  She still drives.  [place] is from [place], but has [place] in [place] [facility]  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 She has two daughters who live locally.  She does not moke.    No Known Allergies  Family history: family history includes CAD in her brother; Lung cancer in her brother; Renal cancer in her sister; Rheum arthritis in her mother.  Prior to Admission medications   Medication Sig Start Date  End Date Taking? Authorizing Provider  amLODipine (NORVASC) 5 MG tablet Take 2.5 mg by mouth 2 (two) times daily.    Yes Historical Provider, MD  ASCORBIC ACID PO Take 1 tablet by mouth daily.   Yes Historical Provider, MD  BIOTIN PO Take by mouth as directed.   Yes Historical Provider, MD  Cholecalciferol (VITAMIN D3) 2000 UNITS TABS Take 1 tablet by mouth daily.   Yes Historical Provider, MD  FLUZONE HIGH-DOSE 0.5 ML SUSY Inject as directed once. 06/29/16  Yes Historical Provider, MD  hydrochlorothiazide (HYDRODIURIL) 25 MG tablet Take 25 mg by mouth daily.   Yes Historical Provider, MD  lidocaine (LIDODERM) 5 % Place 1 patch onto the skin as needed (for pain.). Remove & Discard patch within 12 hours or as directed by MD   Yes Historical Provider, MD  metoprolol succinate (TOPROL-XL) 50 MG 24 hr tablet Take 50 mg by mouth every evening. Take with or immediately following a meal.   Yes Historical Provider, MD  MISC NATURAL PRODUCTS PO Take by mouth.   Yes Historical Provider, MD  Multiple Vitamin (MULTIVITAMIN) tablet Take 1 tablet by mouth daily.   Yes Historical Provider, MD  pantoprazole (PROTONIX) 40 MG tablet Take 40 mg by mouth daily.   Yes Historical Provider, MD  SELENIUM PO Take by mouth as directed.   Yes Historical Provider, MD     Physical Exam: BP 158/79 (BP Location: Left Arm)   Pulse 65   Temp 99 F (37.2 C) (Oral)   Resp 18   SpO2 95%  General appearance: Thin elderly adult female, alert and in no acute distress.   Eyes: Anicteric, conjunctiva pink, lids and lashes normal. PERRL.    ENT: No nasal deformity, discharge, epistaxis.  Hearing impaired, mildly. OP moist without lesions.   Dentition normal. Lymph: No cervical, supraclavicular or axillary lymphadenopathy. Skin: Warm and dry.  No jaundice.  No suspicious rashes or lesions. Cardiac: RRR, nl S1-S2, no murmurs appreciated.  Capillary refill is brisk.  JVP normal.  No LE edema.  Radial and DP pulses 2+ and symmetric.     Respiratory: Normal respiratory rate and rhythm.  CTAB without rales or wheezes. MSK: No deformities or effusions. Neuro: Pupils are 4 mm and reactive to 3 mm. Extraocular movements are intact, without nystagmus. Cranial nerve 5 is within normal limits. Cranial nerve 7 is symmetrical. Cranial nerve 8 is within normal limits. Cranial nerves 9 and 10 reveal equal palate elevation. Cranial nerve 11 reveals sternocleidomastoid strong. Cranial nerve 12 is midline. I do not note a deficit in motor strength testing in the upper and lower extremities bilaterally with normal motor, tone and bulk. Romberg maneuver is negative for pathology. Sensation is chronically impaired on left leg, stable, equal sensation on arms to light touch, pinprick, cold.  Speech is fluent. Naming is grossly intact. Attention span  and concentration are within normal limits.   Psych: The patient is oriented to time, place and person. MiniCog 2/2, normal.  Behavior appropriate.  Affect normal.  Recall, recent and remote, as well as general fund of knowledge seem within normal limits. No evidence of aural or visual hallucinations or delusions.       Labs on Admission:  I have personally reviewed following labs and imaging studies: CBC:  Recent Labs Lab 07/17/16 1445 07/17/16 1509  WBC 8.3  --   NEUTROABS 5.5  --   HGB 13.6 14.6  HCT 39.8 43.0  MCV 93.0  --   PLT 246  --    Basic Metabolic Panel:  Recent Labs Lab 07/17/16 1445 07/17/16 1509  NA 139 140  K 3.3* 2.9*  CL 101 100*  CO2 29  --   GLUCOSE 109* 105*  BUN 14 14  CREATININE 0.96 0.90  CALCIUM 10.0  --    GFR: Estimated Creatinine Clearance: 28.3 mL/min (by C-G formula based on SCr of 0.9 mg/dL). Liver Function Tests:  Recent Labs Lab 07/17/16 1445  AST 24  ALT 11*  ALKPHOS 83  BILITOT 0.8  PROT 7.0  ALBUMIN 4.3   No results for input(s): LIPASE, AMYLASE in the last 168 hours. No results for input(s): AMMONIA in the last 168  hours. Coagulation Profile:  Recent Labs Lab 07/17/16 1445  INR 0.88   Cardiac Enzymes: No results for input(s): CKTOTAL, CKMB, CKMBINDEX, TROPONINI in the last 168 hours. BNP (last 3 results) No results for input(s): PROBNP in the last 8760 hours. HbA1C: No results for input(s): HGBA1C in the last 72 hours. CBG:  Recent Labs Lab 07/17/16 1444  GLUCAP 103*   Lipid Profile: No results for input(s): CHOL, HDL, LDLCALC, TRIG, CHOLHDL, LDLDIRECT in the last 72 hours. Thyroid Function Tests: No results for input(s): TSH, T4TOTAL, FREET4, T3FREE, THYROIDAB in the last 72 hours. Anemia Panel: No results for input(s): VITAMINB12, FOLATE, FERRITIN, TIBC, IRON, RETICCTPCT in the last 72 hours. Sepsis Labs: Invalid input(s): PROCALCITONIN, LACTICIDVEN No results found for this or any previous visit (from the past 240 hour(s)).    Radiological Exams on Admission: Personally reviewed CXR shows no focal opacity.  CT head report reviewed: Dg Chest Portable 1 View  Result Date: 07/17/2016 CLINICAL DATA:  Confusion, LEFT arm and leg numbness today, hypertension, TIA EXAM: PORTABLE CHEST 1 VIEW COMPARISON:  Portable exam 1523 hours compared to 01/22/2013 FINDINGS: Normal heart size, mediastinal contours, and pulmonary vascularity. Atherosclerotic calcification aorta. Lungs clear. No pleural effusion or pneumothorax. Bones demineralized. BILATERAL breast prostheses. IMPRESSION: No acute abnormalities. Aortic atherosclerosis. Electronically Signed   By: Ulyses SouthwardMark  Boles M.D.   On: 07/17/2016 15:47   Ct Head Code Stroke W/o Cm  Result Date: 07/17/2016 CLINICAL DATA:  Code stroke. 82 year old female with confusion, transient numbness. Initial encounter. EXAM: CT HEAD WITHOUT CONTRAST TECHNIQUE: Contiguous axial images were obtained from the base of the skull through the vertex without intravenous contrast. COMPARISON:  Head CT without contrast 09/14/2007 FINDINGS: Brain: Cerebral volume remains normal  for age. No midline shift, ventriculomegaly, mass effect, evidence of mass lesion, intracranial hemorrhage or evidence of cortically based acute infarction. Gray-white matter differentiation is normal for age throughout the brain. No cortical encephalomalacia. Vascular: Calcified atherosclerosis at the skull base. No suspicious intracranial vascular hyperdensity. Skull: No osseous abnormality identified. Sinuses/Orbits: Clear. Other: No acute orbit or scalp soft tissue findings. ASPECTS [facility]([place] Stroke Program Early CT Score) - Ganglionic level infarction (caudate, lentiform  nuclei, internal capsule, insula, M1-M3 cortex): 7 - Supraganglionic infarction (M4-M6 cortex): 3 Total score (0-10 with 10 being normal): 10 IMPRESSION: 1. Normal for age non contrast CT appearance of the brain. , stable since 2009. 2. ASPECTS is 10. 3. Study discussed by telephone with Dr. Ranae Palms On 07/17/2016 at 15:23 . Electronically Signed   By: Odessa Fleming M.D.   On: 07/17/2016 15:24     EKG: Independently reviewed. Rate 68, QTc normal, normal sinus rhythm, no ST changes.    Assessment/Plan  1. Acute TIA:  This is new.  Symptoms resolved.  ABCD 4. -Admit to telemetry -Neuro checks, NIHSS per protocol -Start daily aspirin 325 mg -Lipids, hemoglobin A1c -Carotid doppler done in June, deferred repeat to Neurology -MR brain and MRA ordered -Echocardiogram ordered -PT consultation -Consult to Neurology, appreciate recommendations   2. Hypokalemia:  This is new.  Supplemented in ER. -Check Mag  3. HTN:  -Continue metoprolol, HCTZ, amlodipine          DVT prophylaxis: Lovenox  Code Status: FULL  Family Communication: Daughters at bedside  Disposition Plan: Anticipate Stroke work up as above and consult to ancillary services.  Expect discharge within 1 day. Consults called: Neurology, will see patient at arrival to [facility] Admission status: Telemetry, OBS status  Core measures: -VTE prophylaxis ordered at  time of admission -Aspirin ordered at admission -Atrial fibrillation: Not present -tPA not given because of symptoms resolved -Dysphagia screen ordered in ER -Lipids ordered -PT eval ordered -Nonsmoker    Medical decision making: Patient seen at 8:40 PM on 07/17/2016.  The patient was discussed with Dr. Darnelle Catalan. What exists of the patient's chart was reviewed in depth and summarized above.  Clinical condition: stable.       Alberteen Sam [facility] Pager [phone]

## 2016-07-17 NOTE — ED Triage Notes (Signed)
Pt reports confusion,sts was shopping and did not know what was she getting, pt pauses when talking. Pt sts HX TIA. HX HTN.also reports  left arm and left leg numbness. No neuro deficit noted. Pt stood up and got on bed without difficulty. Alert and oriented x 4.

## 2016-07-17 NOTE — ED Provider Notes (Signed)
[facility]-EMERGENCY DEPT Provider Note   CSN: [id number] Arrival date & time: 07/17/16  1422     History   Chief Complaint Chief Complaint  Patient presents with  . Altered Mental Status    HPI Melanie [name] is a 82 y.o. female with a past medical history significant for hypertension and GERD who presents for transient neurologic deficit. She reports that she was at [facility] approximately 30 minutes ago and had onset of confusion and left sided numbness. She says that she tried to get someone to help her but had a difficult time remembering any of her phone numbers. She was not sure why she was there or what she was getting. She said that she sat down and began feeling better. She presents to the ED for further evaluation. She said that her confusion has cleared up and the reported left arm and left leg numbness has also improved. She says she has baseline left leg numbness due to injections for sciatica.   She reports that she had a similar episode 3 weeks ago that was "not as bad" and then 3 weeks prior to that, had another episode. This is her third episode. She says she has not seen a doctor for these prior episodes. She says that her blood pressure has been well managed at home and she denies any current symptoms. She denied dysarthria or aphasia but had memory problems today.  She denies any fevers, chills, chest pain, shortness of breath, nausea, vomiting, headaches, vision changes, abdominal pain, constipation, diarrhea, or dysuria. She denies any history of UTIs or other infectious-like symptoms today.  She reports that she was last normal at 1415, 45 minutes ago.   The history is provided by the patient and medical records. No language interpreter was used.  Neurologic Problem  This is a new problem. The current episode started less than 1 hour ago. The problem occurs rarely. The problem has been resolved. Pertinent negatives include no chest pain, no abdominal pain, no headaches and  no shortness of breath. Nothing aggravates the symptoms. Nothing relieves the symptoms. She has tried nothing for the symptoms.    Past Medical History:  Diagnosis Date  . Bruises easily    on Aspirin;on hold for surgery  . Carotid stenosis   . Chronic back pain    stenosis  . Chronic sinusitis   . Diverticulosis   . Esophageal stricture   . GERD (gastroesophageal reflux disease)    takes Protonix daily  . Heart murmur    as a child  . Hemorrhoids   . History of colon polyps   . Hypertension    takes Amlodipine,HCTZ,and Metoprolol  . Insomnia   . Peripheral edema   . Schatzki's ring   . Scoliosis     Patient Active Problem List   Diagnosis Date Noted  . Mitral regurgitation 02/24/2015  . Spinal stenosis, lumbar region, with neurogenic claudication 01/27/2013  . Lumbar scoliosis 01/27/2013    Past Surgical History:  Procedure Laterality Date  . BREAST ENHANCEMENT SURGERY    . CHOLECYSTECTOMY    . COLONOSCOPY    . DILATION AND CURETTAGE OF UTERUS    . epidural injections    . ESOPHAGOGASTRODUODENOSCOPY    . EYE SURGERY     left cataract removed  . LUMBAR LAMINECTOMY/DECOMPRESSION MICRODISCECTOMY Left 01/26/2013   Procedure: LUMBAR LAMINECTOMY/DECOMPRESSION MICRODISCECTOMY 1 LEVEL;  Surgeon: Barnett Abu, MD;  Location: [facility];  Service: Neurosurgery;  Laterality: Left;  Left Lumbar four-five Laminectomy/Foraminotomy  .  steroid injections     x 3  . TOTAL HIP ARTHROPLASTY Right     OB History    No data available       Home Medications    Prior to Admission medications   Medication Sig Start Date End Date Taking? Authorizing Provider  amLODipine (NORVASC) 5 MG tablet Take 5 mg by mouth daily.    Historical Provider, MD  aspirin EC 81 MG tablet Take 81 mg by mouth daily.    Historical Provider, MD  Cholecalciferol (VITAMIN D3) 2000 UNITS TABS Take 1 tablet by mouth daily.    Historical Provider, MD  diazepam (VALIUM) 5 MG tablet Take 1 tablet (5 mg  total) by mouth every 6 (six) hours as needed (muscle spasm). 01/27/13   Barnett Abu, MD  FLUZONE HIGH-DOSE 0.5 ML SUSY Inject as directed once. 06/29/16   Historical Provider, MD  Glucosamine 500 MG CAPS Take 1 capsule by mouth daily as needed (for joints).     Historical Provider, MD  hydrochlorothiazide (HYDRODIURIL) 25 MG tablet Take 25 mg by mouth daily.    Historical Provider, MD  lidocaine (LIDODERM) 5 % Place 1 patch onto the skin as needed (for pain.). Remove & Discard patch within 12 hours or as directed by MD    Historical Provider, MD  metoprolol succinate (TOPROL-XL) 50 MG 24 hr tablet Take 50 mg by mouth daily. Take with or immediately following a meal.    Historical Provider, MD  Multiple Vitamin (MULTIVITAMIN) tablet Take 1 tablet by mouth daily.    Historical Provider, MD  oxyCODONE-acetaminophen (PERCOCET/ROXICET) 5-325 MG per tablet Take 1-2 tablets by mouth every 4 (four) hours as needed. Patient not taking: Reported on 07/11/2016 01/27/13   Barnett Abu, MD  pantoprazole (PROTONIX) 40 MG tablet Take 40 mg by mouth daily.    Historical Provider, MD    Family History Family History  Problem Relation Age of Onset  . Rheum arthritis Mother   . Renal cancer Sister   . CAD Brother   . Lung cancer Brother     Social History Social History  Substance Use Topics  . Smoking status: Never Smoker  . Smokeless tobacco: Never Used  . Alcohol use No     Allergies   Patient has no known allergies.   Review of Systems Review of Systems  Constitutional: Negative for activity change, chills, diaphoresis, fatigue and fever.  HENT: Negative for congestion and rhinorrhea.   Eyes: Negative for visual disturbance.  Respiratory: Negative for cough, chest tightness, shortness of breath and stridor.   Cardiovascular: Negative for chest pain, palpitations and leg swelling.  Gastrointestinal: Negative for abdominal distention, abdominal pain, constipation, diarrhea, nausea and vomiting.    Genitourinary: Negative for difficulty urinating, dysuria, flank pain, frequency, hematuria, menstrual problem, pelvic pain, vaginal bleeding and vaginal discharge.  Musculoskeletal: Negative for back pain and neck pain.  Skin: Negative for rash and wound.  Neurological: Positive for numbness. Negative for dizziness, syncope, facial asymmetry, weakness, light-headedness and headaches.  Psychiatric/Behavioral: Positive for confusion. Negative for agitation.  All other systems reviewed and are negative.    Physical Exam Updated Vital Signs BP 159/72 (BP Location: Left Arm)   Pulse 63   Temp 99.4 F (37.4 C) (Oral)   Resp 14   SpO2 93%   Physical Exam  Constitutional: She is oriented to person, place, and time. She appears well-developed and well-nourished. No distress.  HENT:  Head: Normocephalic and atraumatic.  Eyes: Conjunctivae are normal.  Neck: Neck supple.  Cardiovascular: Normal rate and regular rhythm.   No murmur heard. Pulmonary/Chest: Effort normal and breath sounds normal. No respiratory distress.  Abdominal: Soft. There is no tenderness.  Musculoskeletal: She exhibits no edema.  Neurological: She is alert and oriented to person, place, and time. She is not disoriented. She displays no tremor and normal reflexes. No cranial nerve deficit or sensory deficit. She exhibits normal muscle tone. She displays no seizure activity. Coordination normal. GCS eye subscore is 4. GCS verbal subscore is 5. GCS motor subscore is 6.  Skin: Skin is warm and dry.  Psychiatric: She has a normal mood and affect.  Nursing note and vitals reviewed.    ED Treatments / Results  Labs (all labs ordered are listed, but only abnormal results are displayed) Labs Reviewed  COMPREHENSIVE METABOLIC PANEL - Abnormal; Notable for the following:       Result Value   Potassium 3.3 (*)    Glucose, Bld 109 (*)    ALT 11 (*)    GFR calc non Af Amer 51 (*)    GFR calc Af Amer 59 (*)    All other  components within normal limits  URINALYSIS, ROUTINE W REFLEX MICROSCOPIC (NOT AT [facility]) - Abnormal; Notable for the following:    Leukocytes, UA MODERATE (*)    All other components within normal limits  HEMOGLOBIN A1C - Abnormal; Notable for the following:    Hgb A1c MFr Bld 5.8 (*)    All other components within normal limits  LIPID PANEL - Abnormal; Notable for the following:    LDL Cholesterol 106 (*)    All other components within normal limits  BASIC METABOLIC PANEL - Abnormal; Notable for the following:    Potassium 3.2 (*)    Chloride 100 (*)    Glucose, Bld 138 (*)    GFR calc non Af Amer 52 (*)    All other components within normal limits  CBG MONITORING, ED - Abnormal; Notable for the following:    Glucose-Capillary 103 (*)    All other components within normal limits  I-STAT CHEM 8, ED - Abnormal; Notable for the following:    Potassium 2.9 (*)    Chloride 100 (*)    Glucose, Bld 105 (*)    All other components within normal limits  ETHANOL  PROTIME-INR  APTT  CBC  DIFFERENTIAL  RAPID URINE DRUG SCREEN, HOSP PERFORMED  URINE MICROSCOPIC-ADD ON  MAGNESIUM  I-STAT TROPOININ, ED    EKG  EKG Interpretation  Date/Time:  Wednesday July 17 2016 14:43:05 EST Ventricular Rate:  68 PR Interval:    QRS Duration: 88 QT Interval:  437 QTC Calculation: 465 R Axis:   37 Text Interpretation:  Sinus rhythm Consider left atrial enlargement Baseline wander When compared with ECG of 01/22/2013 No significant change was found Confirmed by [facility]  MD, Nicholos Johns [phone]) on 07/18/2016 9:55:22 PM       Radiology Mr Brain Wo Contrast  Result Date: 07/18/2016 CLINICAL DATA:  Episode of transient left-sided numbness in confusion yesterday. The patient had transient loss of memory as well. Symptoms have since resolved. EXAM: MRI HEAD WITHOUT CONTRAST MRA HEAD WITHOUT CONTRAST TECHNIQUE: Multiplanar, multiecho pulse sequences of the brain and surrounding structures were  obtained without intravenous contrast. Angiographic images of the head were obtained using MRA technique without contrast. COMPARISON:  CT head without contrast 07/15/2016 FINDINGS: MRI HEAD FINDINGS Brain: A 9 mm acute/ subacute nonhemorrhagic infarct is present just posterior to  the sylvian fissure and the left parietal lobe no significant right-sided infarct is present. Mild atrophy and diffuse white matter changes are present bilaterally. Dilated perivascular spaces are evident throughout the basal ganglia. The brainstem and cerebellum are unremarkable. The internal auditory canals are normal bilaterally. Vascular: Flow is present in the major intracranial arteries. Skull and upper cervical spine: The skullbase is within normal limits. Craniocervical junction is normal. Central stenosis is present in the cervical spine at C4-5 due to a broad-based disc osteophyte complex. The upper cervical spine is otherwise unremarkable. Midline sagittal structures are otherwise within normal limits. Sinuses/Orbits: Mild mucosal thickening is present in the maxillary sinuses. The remaining paranasal sinuses and the mastoid air cells are clear. Bilateral lens replacements are present. The globes and orbits are otherwise intact. MRA HEAD FINDINGS Internal carotid arteries are within normal limits from the high cervical segments through the ICA termini bilaterally. There is mild narrowing in the distal right A1 segment. The left A1 segment and bilateral M1 segments are normal. The MCA bifurcations are intact. ACA and MCA branch vessels are within normal limits. Anterior communicating artery is patent. The left vertebral artery is slightly dominant to the right. Left PICA origin is visualized and normal. The right AICA is dominant. The left posterior cerebral artery originates from the basilar tip. The right posterior cerebral artery is fed by a P1 segment and prominent posterior communicating artery. The PCA branch vessels are  within normal limits bilaterally. IMPRESSION: 1. Acute nonhemorrhagic white matter infarct measurement 8 mm adjacent to the atrium of the left lateral ventricle immediately posterior to the sylvian fissure. 2. No significant right-sided infarct. 3. Mild atrophy and white matter disease is likely within normal limits for age otherwise. 4. Unremarkable MRA circle of Willis without significant proximal stenosis, aneurysm, or branch vessel occlusion. Electronically Signed   By: Marin Robertshristopher  Mattern M.D.   On: 07/18/2016 07:19   Dg Chest Portable 1 View  Result Date: 07/17/2016 CLINICAL DATA:  Confusion, LEFT arm and leg numbness today, hypertension, TIA EXAM: PORTABLE CHEST 1 VIEW COMPARISON:  Portable exam 1523 hours compared to 01/22/2013 FINDINGS: Normal heart size, mediastinal contours, and pulmonary vascularity. Atherosclerotic calcification aorta. Lungs clear. No pleural effusion or pneumothorax. Bones demineralized. BILATERAL breast prostheses. IMPRESSION: No acute abnormalities. Aortic atherosclerosis. Electronically Signed   By: Ulyses SouthwardMark  Boles M.D.   On: 07/17/2016 15:47   Mr Maxine GlennMra Head/brain ZOWo Cm  Result Date: 07/18/2016 CLINICAL DATA:  Episode of transient left-sided numbness in confusion yesterday. The patient had transient loss of memory as well. Symptoms have since resolved. EXAM: MRI HEAD WITHOUT CONTRAST MRA HEAD WITHOUT CONTRAST TECHNIQUE: Multiplanar, multiecho pulse sequences of the brain and surrounding structures were obtained without intravenous contrast. Angiographic images of the head were obtained using MRA technique without contrast. COMPARISON:  CT head without contrast 07/15/2016 FINDINGS: MRI HEAD FINDINGS Brain: A 9 mm acute/ subacute nonhemorrhagic infarct is present just posterior to the sylvian fissure and the left parietal lobe no significant right-sided infarct is present. Mild atrophy and diffuse white matter changes are present bilaterally. Dilated perivascular spaces are  evident throughout the basal ganglia. The brainstem and cerebellum are unremarkable. The internal auditory canals are normal bilaterally. Vascular: Flow is present in the major intracranial arteries. Skull and upper cervical spine: The skullbase is within normal limits. Craniocervical junction is normal. Central stenosis is present in the cervical spine at C4-5 due to a broad-based disc osteophyte complex. The upper cervical spine is otherwise unremarkable. Midline sagittal  structures are otherwise within normal limits. Sinuses/Orbits: Mild mucosal thickening is present in the maxillary sinuses. The remaining paranasal sinuses and the mastoid air cells are clear. Bilateral lens replacements are present. The globes and orbits are otherwise intact. MRA HEAD FINDINGS Internal carotid arteries are within normal limits from the high cervical segments through the ICA termini bilaterally. There is mild narrowing in the distal right A1 segment. The left A1 segment and bilateral M1 segments are normal. The MCA bifurcations are intact. ACA and MCA branch vessels are within normal limits. Anterior communicating artery is patent. The left vertebral artery is slightly dominant to the right. Left PICA origin is visualized and normal. The right AICA is dominant. The left posterior cerebral artery originates from the basilar tip. The right posterior cerebral artery is fed by a P1 segment and prominent posterior communicating artery. The PCA branch vessels are within normal limits bilaterally. IMPRESSION: 1. Acute nonhemorrhagic white matter infarct measurement 8 mm adjacent to the atrium of the left lateral ventricle immediately posterior to the sylvian fissure. 2. No significant right-sided infarct. 3. Mild atrophy and white matter disease is likely within normal limits for age otherwise. 4. Unremarkable MRA circle of Willis without significant proximal stenosis, aneurysm, or branch vessel occlusion. Electronically Signed   By:  Marin Roberts M.D.   On: 07/18/2016 07:19   Ct Head Code Stroke W/o Cm  Result Date: 07/17/2016 CLINICAL DATA:  Code stroke. 82 year old female with confusion, transient numbness. Initial encounter. EXAM: CT HEAD WITHOUT CONTRAST TECHNIQUE: Contiguous axial images were obtained from the base of the skull through the vertex without intravenous contrast. COMPARISON:  Head CT without contrast 09/14/2007 FINDINGS: Brain: Cerebral volume remains normal for age. No midline shift, ventriculomegaly, mass effect, evidence of mass lesion, intracranial hemorrhage or evidence of cortically based acute infarction. Gray-white matter differentiation is normal for age throughout the brain. No cortical encephalomalacia. Vascular: Calcified atherosclerosis at the skull base. No suspicious intracranial vascular hyperdensity. Skull: No osseous abnormality identified. Sinuses/Orbits: Clear. Other: No acute orbit or scalp soft tissue findings. ASPECTS [facility] Stroke Program Early CT Score) - Ganglionic level infarction (caudate, lentiform nuclei, internal capsule, insula, M1-M3 cortex): 7 - Supraganglionic infarction (M4-M6 cortex): 3 Total score (0-10 with 10 being normal): 10 IMPRESSION: 1. Normal for age non contrast CT appearance of the brain. , stable since 2009. 2. ASPECTS is 10. 3. Study discussed by telephone with Dr. Ranae Palms On 07/17/2016 at 15:23 . Electronically Signed   By: Odessa Fleming M.D.   On: 07/17/2016 15:24    Procedures Procedures (including critical care time)  Medications Ordered in ED Medications  metoprolol succinate (TOPROL-XL) 24 hr tablet 50 mg (0 mg Oral Hold 07/18/16 2049)  pantoprazole (PROTONIX) EC tablet 40 mg (40 mg Oral Given 07/19/16 1110)  acetaminophen (TYLENOL) tablet 650 mg (not administered)    Or  acetaminophen (TYLENOL) suppository 650 mg (not administered)  aspirin suppository 300 mg ( Rectal See Alternative 07/19/16 1110)    Or  aspirin tablet 325 mg (325 mg Oral Given  07/19/16 1110)  diazepam (VALIUM) tablet 2 mg (not administered)  pravastatin (PRAVACHOL) tablet 20 mg (20 mg Oral Given 07/18/16 1654)  enoxaparin (LOVENOX) injection 30 mg (not administered)  0.9 %  sodium chloride infusion (not administered)  potassium chloride (KLOR-CON) packet 20 mEq (not administered)  potassium chloride SA (K-DUR,KLOR-CON) CR tablet 40 mEq (40 mEq Oral Given 07/17/16 1545)   stroke: mapping our early stages of recovery book ( Does not apply Given  07/18/16 0100)  potassium chloride SA (K-DUR,KLOR-CON) CR tablet 40 mEq (40 mEq Oral Given 07/18/16 1349)     Initial Impression / Assessment and Plan / ED Course  I have reviewed the triage vital signs and the nursing notes.  Pertinent labs & imaging results that were available during my care of the patient were reviewed by me and considered in my medical decision making (see chart for details).  Clinical Course     Lennox LaityGladys R Spivak is a 82 y.o. female with a past medical history significant for hypertension and GERD who presents for transient neurologic deficit.  History and exam are seen above. On assessment, patient's reported symptoms of confusion and left-sided numbness have resolved. She denies any weakness. Her lungs were clear, abdomen was nontender, and no neurologic deficits were appreciated. She does report some mild left leg baseline numbness which is unchanged from before the episode. 1415 was her last reported normal. She reports being normal upon presentation.   Pt will have workup to look for stroke, TIA, or joint abnormality, or occult infection causing her symptoms. Patient will have laboratory testing, EKG, and head CT. Anticipate speaking with neurology to further direct workup if all are negative.  Diagnostic workup showed no evidence of acute infection.CT scan showed no significant abnormalities. Potassium was low, this was supplemented orally.  Neurology was called and they [facility] management including MRI, EEG, and echo. They suspect TIA versus seizures.  Patient agreed with plan of care and patient was admitted for transfer. Patient was admitted in stable condition.   Final Clinical Impressions(s) / ED Diagnoses   Final diagnoses:  TIA (transient ischemic attack)  TIA (transient ischemic attack)     Clinical Impression: 1. TIA (transient ischemic attack)   2. TIA (transient ischemic attack)     Disposition: Admit to hospitalist service with neurology following    Heide Scaleshristopher J Tegeler, MD 07/19/16 1143

## 2016-07-17 NOTE — ED Notes (Signed)
Pt reports daughters Lupita LeashDonna and Harriett Sineancy may receive updates on pt.

## 2016-07-18 ENCOUNTER — Observation Stay (HOSPITAL_BASED_OUTPATIENT_CLINIC_OR_DEPARTMENT_OTHER): Payer: Medicare Other

## 2016-07-18 ENCOUNTER — Observation Stay (HOSPITAL_COMMUNITY): Payer: Medicare Other

## 2016-07-18 DIAGNOSIS — Z8673 Personal history of transient ischemic attack (TIA), and cerebral infarction without residual deficits: Secondary | ICD-10-CM | POA: Diagnosis not present

## 2016-07-18 DIAGNOSIS — R4182 Altered mental status, unspecified: Secondary | ICD-10-CM | POA: Diagnosis present

## 2016-07-18 DIAGNOSIS — E785 Hyperlipidemia, unspecified: Secondary | ICD-10-CM

## 2016-07-18 DIAGNOSIS — G459 Transient cerebral ischemic attack, unspecified: Secondary | ICD-10-CM

## 2016-07-18 DIAGNOSIS — Z86010 Personal history of colonic polyps: Secondary | ICD-10-CM | POA: Diagnosis not present

## 2016-07-18 DIAGNOSIS — I10 Essential (primary) hypertension: Secondary | ICD-10-CM | POA: Diagnosis not present

## 2016-07-18 DIAGNOSIS — K219 Gastro-esophageal reflux disease without esophagitis: Secondary | ICD-10-CM | POA: Diagnosis present

## 2016-07-18 DIAGNOSIS — Z8261 Family history of arthritis: Secondary | ICD-10-CM | POA: Diagnosis not present

## 2016-07-18 DIAGNOSIS — Z79891 Long term (current) use of opiate analgesic: Secondary | ICD-10-CM | POA: Diagnosis not present

## 2016-07-18 DIAGNOSIS — I63412 Cerebral infarction due to embolism of left middle cerebral artery: Principal | ICD-10-CM

## 2016-07-18 DIAGNOSIS — I639 Cerebral infarction, unspecified: Secondary | ICD-10-CM | POA: Diagnosis present

## 2016-07-18 DIAGNOSIS — Z79899 Other long term (current) drug therapy: Secondary | ICD-10-CM | POA: Diagnosis not present

## 2016-07-18 DIAGNOSIS — Z8051 Family history of malignant neoplasm of kidney: Secondary | ICD-10-CM | POA: Diagnosis not present

## 2016-07-18 DIAGNOSIS — G451 Carotid artery syndrome (hemispheric): Secondary | ICD-10-CM | POA: Diagnosis not present

## 2016-07-18 DIAGNOSIS — R011 Cardiac murmur, unspecified: Secondary | ICD-10-CM | POA: Diagnosis present

## 2016-07-18 DIAGNOSIS — Z981 Arthrodesis status: Secondary | ICD-10-CM | POA: Diagnosis not present

## 2016-07-18 DIAGNOSIS — E876 Hypokalemia: Secondary | ICD-10-CM | POA: Diagnosis not present

## 2016-07-18 DIAGNOSIS — I340 Nonrheumatic mitral (valve) insufficiency: Secondary | ICD-10-CM | POA: Diagnosis present

## 2016-07-18 DIAGNOSIS — Z7982 Long term (current) use of aspirin: Secondary | ICD-10-CM | POA: Diagnosis not present

## 2016-07-18 DIAGNOSIS — T502X5A Adverse effect of carbonic-anhydrase inhibitors, benzothiadiazides and other diuretics, initial encounter: Secondary | ICD-10-CM | POA: Diagnosis present

## 2016-07-18 DIAGNOSIS — I4891 Unspecified atrial fibrillation: Secondary | ICD-10-CM | POA: Diagnosis present

## 2016-07-18 DIAGNOSIS — Z96641 Presence of right artificial hip joint: Secondary | ICD-10-CM | POA: Diagnosis present

## 2016-07-18 DIAGNOSIS — K222 Esophageal obstruction: Secondary | ICD-10-CM | POA: Diagnosis present

## 2016-07-18 DIAGNOSIS — Z801 Family history of malignant neoplasm of trachea, bronchus and lung: Secondary | ICD-10-CM | POA: Diagnosis not present

## 2016-07-18 DIAGNOSIS — K5790 Diverticulosis of intestine, part unspecified, without perforation or abscess without bleeding: Secondary | ICD-10-CM | POA: Diagnosis present

## 2016-07-18 DIAGNOSIS — Z8249 Family history of ischemic heart disease and other diseases of the circulatory system: Secondary | ICD-10-CM | POA: Diagnosis not present

## 2016-07-18 DIAGNOSIS — Z9842 Cataract extraction status, left eye: Secondary | ICD-10-CM | POA: Diagnosis not present

## 2016-07-18 LAB — ECHOCARDIOGRAM COMPLETE
Height: 59 in
WEIGHTICAEL: 1619.06 [oz_av]

## 2016-07-18 LAB — BASIC METABOLIC PANEL
Anion gap: 10 (ref 5–15)
BUN: 10 mg/dL (ref 6–20)
CHLORIDE: 100 mmol/L — AB (ref 101–111)
CO2: 27 mmol/L (ref 22–32)
Calcium: 9.4 mg/dL (ref 8.9–10.3)
Creatinine, Ser: 0.94 mg/dL (ref 0.44–1.00)
GFR calc Af Amer: >60 mL/min (ref >60)
GFR calc non Af Amer: 52 mL/min — ABNORMAL LOW (ref >60)
Glucose, Bld: 138 mg/dL — ABNORMAL HIGH (ref 65–99)
POTASSIUM: 3.2 mmol/L — AB (ref 3.5–5.1)
SODIUM: 137 mmol/L (ref 135–145)

## 2016-07-18 LAB — LIPID PANEL
CHOL/HDL RATIO: 3.2 ratio
CHOLESTEROL: 181 mg/dL (ref 0–200)
HDL: 56 mg/dL (ref >40)
LDL Cholesterol: 106 mg/dL — ABNORMAL HIGH (ref 0–99)
Triglycerides: 93 mg/dL (ref <150)
VLDL: 19 mg/dL (ref 0–40)

## 2016-07-18 MED ORDER — PRAVASTATIN SODIUM 20 MG PO TABS
20.0000 mg | ORAL_TABLET | Freq: Every day | ORAL | Status: DC
  Administered 2016-07-18: 20 mg via ORAL
  Filled 2016-07-18: qty 1

## 2016-07-18 MED ORDER — SODIUM CHLORIDE 0.9 % IV SOLN: INTRAVENOUS | Status: DC

## 2016-07-18 MED ORDER — POTASSIUM CHLORIDE CRYS ER 20 MEQ PO TBCR
40.0000 meq | EXTENDED_RELEASE_TABLET | Freq: Once | ORAL | Status: AC
  Administered 2016-07-18: 40 meq via ORAL
  Filled 2016-07-18: qty 2

## 2016-07-18 MED ORDER — ENOXAPARIN SODIUM 30 MG/0.3ML ~~LOC~~ SOLN: 30.0000 mg | SUBCUTANEOUS | Status: DC

## 2016-07-18 NOTE — Progress Notes (Signed)
Patient ambulated with no difficulties to bathroom, back to bed. Patient vitals obtained. Schedule toprol held due to HR 57, MD paged. RN will continue to monitor.

## 2016-07-18 NOTE — Progress Notes (Signed)
PROGRESS NOTE    Melanie LaityGladys R [name]  NFA:[id number] DOB: 02/21/1924 DOA: 07/17/2016 PCP: Gwen PoundsUSSO,JOHN M, MD  Brief Narrative:Melanie R Redneris a 82 y.o.femalewho presents with transient left-sided numbness and confusion earlier today. She states that she walks into a grocery store, and then forgot what she was doing there or how to go about her regular activities. She did not have her cell phone, and went to call her daughter whom she calls every day(manually) but could not remember the number. She then was going to ask someone else to call her own number, but could not remember her own. She does not think that she had any difficulty with speech. She noticed that her left side was numb during this episode as well. The symptoms completely resolved, but she was very disconcerted within and therefore presented to the emergency room. She also notes a couple of episodes in the past where just for a second or 2, she will have some difficulty with finding the words that she wants.  Assessment & Plan:   ACute CVA -no residual deficits -MRI with left lateral ventricle infarct  -on same side as her symptoms, hence doesn't correlate -NEuro consulting -ECHO pending -Recent carotid duplex 6/23 without significant stenosis -LDL 106, Hba1c pending -PT/OT/SLP consulted  Hypertension -stable, continue metoprolol -hold amlodipine and HCTZ -permissible HTN  Hyperlipidemia -LDL 106 started statin  Hypokalemia 2.9 -due to HCTZ,  -replaced  DVT prophylaxis: Lovenox  Code Status: FULL  Family Communication: NO family at bedside  Disposition Plan: ? Home tomorrow  Consultants:   Neuro    Subjective: Feels ok, back to baseline, no new symptoms  Objective: Vitals:   07/18/16 0100 07/18/16 0300 07/18/16 0500 07/18/16 1004  BP: 120/71 116/62 121/69 (!) 119/59  Pulse: 62 (!) 56 63 61  Resp: 18 18 18 19   Temp: 99.1 F (37.3 C) 98.9 F (37.2 C) 97.9 F (36.6 C) 98 F (36.7 C)  TempSrc:  Oral Oral Oral Oral  SpO2: 99% 100% 100% 100%  Weight:      Height:        Intake/Output Summary (Last 24 hours) at 07/18/16 1339 Last data filed at 07/18/16 0515  Gross per 24 hour  Intake              480 ml  Output                0 ml  Net              480 ml   Filed Weights   07/17/16 2258  Weight: 45.9 kg (101 lb 3.1 oz)    Examination:  General exam: Appears calm and comfortable , AAOx3, no distress Respiratory system: Clear to auscultation. Respiratory effort normal. Cardiovascular system: S1 & S2 heard, RRR. No JVD, murmurs, rubs, gallops or clicks. No pedal edema. Gastrointestinal system: Abdomen is nondistended, soft and nontender. No organomegaly or masses felt. Normal bowel sounds heard. Central nervous system: Alert and oriented. No focal neurological deficits. Extremities: Symmetric 5 x 5 power. Skin: No rashes, lesions or ulcers Psychiatry: Judgement and insight appear normal. Mood & affect appropriate.     Data Reviewed: I have personally reviewed following labs and imaging studies  CBC:  Recent Labs Lab 07/17/16 1445 07/17/16 1509  WBC 8.3  --   NEUTROABS 5.5  --   HGB 13.6 14.6  HCT 39.8 43.0  MCV 93.0  --   PLT 246  --    Basic Metabolic Panel:  Recent  Labs Lab 07/17/16 1445 07/17/16 1508 07/17/16 1509 07/18/16 0800  NA 139  --  140 137  K 3.3*  --  2.9* 3.2*  CL 101  --  100* 100*  CO2 29  --   --  27  GLUCOSE 109*  --  105* 138*  BUN 14  --  14 10  CREATININE 0.96  --  0.90 0.94  CALCIUM 10.0  --   --  9.4  MG  --  2.2  --   --    GFR: Estimated Creatinine Clearance: 27.1 mL/min (by C-G formula based on SCr of 0.94 mg/dL). Liver Function Tests:  Recent Labs Lab 07/17/16 1445  AST 24  ALT 11*  ALKPHOS 83  BILITOT 0.8  PROT 7.0  ALBUMIN 4.3   No results for input(s): LIPASE, AMYLASE in the last 168 hours. No results for input(s): AMMONIA in the last 168 hours. Coagulation Profile:  Recent Labs Lab 07/17/16 1445    INR 0.88   Cardiac Enzymes: No results for input(s): CKTOTAL, CKMB, CKMBINDEX, TROPONINI in the last 168 hours. BNP (last 3 results) No results for input(s): PROBNP in the last 8760 hours. HbA1C: No results for input(s): HGBA1C in the last 72 hours. CBG:  Recent Labs Lab 07/17/16 1444  GLUCAP 103*   Lipid Profile:  Recent Labs  07/18/16 0253  CHOL 181  HDL 56  LDLCALC 106*  TRIG 93  CHOLHDL 3.2   Thyroid Function Tests: No results for input(s): TSH, T4TOTAL, FREET4, T3FREE, THYROIDAB in the last 72 hours. Anemia Panel: No results for input(s): VITAMINB12, FOLATE, FERRITIN, TIBC, IRON, RETICCTPCT in the last 72 hours. Urine analysis:    Component Value Date/Time   COLORURINE YELLOW 07/17/2016 1615   APPEARANCEUR CLEAR 07/17/2016 1615   LABSPEC 1.008 07/17/2016 1615   PHURINE 6.0 07/17/2016 1615   GLUCOSEU NEGATIVE 07/17/2016 1615   HGBUR NEGATIVE 07/17/2016 1615   BILIRUBINUR NEGATIVE 07/17/2016 1615   KETONESUR NEGATIVE 07/17/2016 1615   PROTEINUR NEGATIVE 07/17/2016 1615   UROBILINOGEN 0.2 05/03/2010 1200   NITRITE NEGATIVE 07/17/2016 1615   LEUKOCYTESUR MODERATE (A) 07/17/2016 1615   Sepsis Labs: @LABRCNTIP (procalcitonin:4,lacticidven:4)  )No results found for this or any previous visit (from the past 240 hour(s)).       Radiology Studies: Mr Brain 61Wo Contrast  Result Date: 07/18/2016 CLINICAL DATA:  Episode of transient left-sided numbness in confusion yesterday. The patient had transient loss of memory as well. Symptoms have since resolved. EXAM: MRI HEAD WITHOUT CONTRAST MRA HEAD WITHOUT CONTRAST TECHNIQUE: Multiplanar, multiecho pulse sequences of the brain and surrounding structures were obtained without intravenous contrast. Angiographic images of the head were obtained using MRA technique without contrast. COMPARISON:  CT head without contrast 07/15/2016 FINDINGS: MRI HEAD FINDINGS Brain: A 9 mm acute/ subacute nonhemorrhagic infarct is present  just posterior to the sylvian fissure and the left parietal lobe no significant right-sided infarct is present. Mild atrophy and diffuse white matter changes are present bilaterally. Dilated perivascular spaces are evident throughout the basal ganglia. The brainstem and cerebellum are unremarkable. The internal auditory canals are normal bilaterally. Vascular: Flow is present in the major intracranial arteries. Skull and upper cervical spine: The skullbase is within normal limits. Craniocervical junction is normal. Central stenosis is present in the cervical spine at C4-5 due to a broad-based disc osteophyte complex. The upper cervical spine is otherwise unremarkable. Midline sagittal structures are otherwise within normal limits. Sinuses/Orbits: Mild mucosal thickening is present in the maxillary sinuses. The remaining  paranasal sinuses and the mastoid air cells are clear. Bilateral lens replacements are present. The globes and orbits are otherwise intact. MRA HEAD FINDINGS Internal carotid arteries are within normal limits from the high cervical segments through the ICA termini bilaterally. There is mild narrowing in the distal right A1 segment. The left A1 segment and bilateral M1 segments are normal. The MCA bifurcations are intact. ACA and MCA branch vessels are within normal limits. Anterior communicating artery is patent. The left vertebral artery is slightly dominant to the right. Left PICA origin is visualized and normal. The right AICA is dominant. The left posterior cerebral artery originates from the basilar tip. The right posterior cerebral artery is fed by a P1 segment and prominent posterior communicating artery. The PCA branch vessels are within normal limits bilaterally. IMPRESSION: 1. Acute nonhemorrhagic white matter infarct measurement 8 mm adjacent to the atrium of the left lateral ventricle immediately posterior to the sylvian fissure. 2. No significant right-sided infarct. 3. Mild atrophy and  white matter disease is likely within normal limits for age otherwise. 4. Unremarkable MRA circle of Willis without significant proximal stenosis, aneurysm, or branch vessel occlusion. Electronically Signed   By: Marin Roberts M.D.   On: 07/18/2016 07:19   Dg Chest Portable 1 View  Result Date: 07/17/2016 CLINICAL DATA:  Confusion, LEFT arm and leg numbness today, hypertension, TIA EXAM: PORTABLE CHEST 1 VIEW COMPARISON:  Portable exam 1523 hours compared to 01/22/2013 FINDINGS: Normal heart size, mediastinal contours, and pulmonary vascularity. Atherosclerotic calcification aorta. Lungs clear. No pleural effusion or pneumothorax. Bones demineralized. BILATERAL breast prostheses. IMPRESSION: No acute abnormalities. Aortic atherosclerosis. Electronically Signed   By: Ulyses Southward M.D.   On: 07/17/2016 15:47   Mr Maxine Glenn Head/brain JW Cm  Result Date: 07/18/2016 CLINICAL DATA:  Episode of transient left-sided numbness in confusion yesterday. The patient had transient loss of memory as well. Symptoms have since resolved. EXAM: MRI HEAD WITHOUT CONTRAST MRA HEAD WITHOUT CONTRAST TECHNIQUE: Multiplanar, multiecho pulse sequences of the brain and surrounding structures were obtained without intravenous contrast. Angiographic images of the head were obtained using MRA technique without contrast. COMPARISON:  CT head without contrast 07/15/2016 FINDINGS: MRI HEAD FINDINGS Brain: A 9 mm acute/ subacute nonhemorrhagic infarct is present just posterior to the sylvian fissure and the left parietal lobe no significant right-sided infarct is present. Mild atrophy and diffuse white matter changes are present bilaterally. Dilated perivascular spaces are evident throughout the basal ganglia. The brainstem and cerebellum are unremarkable. The internal auditory canals are normal bilaterally. Vascular: Flow is present in the major intracranial arteries. Skull and upper cervical spine: The skullbase is within normal limits.  Craniocervical junction is normal. Central stenosis is present in the cervical spine at C4-5 due to a broad-based disc osteophyte complex. The upper cervical spine is otherwise unremarkable. Midline sagittal structures are otherwise within normal limits. Sinuses/Orbits: Mild mucosal thickening is present in the maxillary sinuses. The remaining paranasal sinuses and the mastoid air cells are clear. Bilateral lens replacements are present. The globes and orbits are otherwise intact. MRA HEAD FINDINGS Internal carotid arteries are within normal limits from the high cervical segments through the ICA termini bilaterally. There is mild narrowing in the distal right A1 segment. The left A1 segment and bilateral M1 segments are normal. The MCA bifurcations are intact. ACA and MCA branch vessels are within normal limits. Anterior communicating artery is patent. The left vertebral artery is slightly dominant to the right. Left PICA origin is visualized and  normal. The right AICA is dominant. The left posterior cerebral artery originates from the basilar tip. The right posterior cerebral artery is fed by a P1 segment and prominent posterior communicating artery. The PCA branch vessels are within normal limits bilaterally. IMPRESSION: 1. Acute nonhemorrhagic white matter infarct measurement 8 mm adjacent to the atrium of the left lateral ventricle immediately posterior to the sylvian fissure. 2. No significant right-sided infarct. 3. Mild atrophy and white matter disease is likely within normal limits for age otherwise. 4. Unremarkable MRA circle of Willis without significant proximal stenosis, aneurysm, or branch vessel occlusion. Electronically Signed   By: Marin Roberts M.D.   On: 07/18/2016 07:19   Ct Head Code Stroke W/o Cm  Result Date: 07/17/2016 CLINICAL DATA:  Code stroke. 82 year old female with confusion, transient numbness. Initial encounter. EXAM: CT HEAD WITHOUT CONTRAST TECHNIQUE: Contiguous axial  images were obtained from the base of the skull through the vertex without intravenous contrast. COMPARISON:  Head CT without contrast 09/14/2007 FINDINGS: Brain: Cerebral volume remains normal for age. No midline shift, ventriculomegaly, mass effect, evidence of mass lesion, intracranial hemorrhage or evidence of cortically based acute infarction. Gray-white matter differentiation is normal for age throughout the brain. No cortical encephalomalacia. Vascular: Calcified atherosclerosis at the skull base. No suspicious intracranial vascular hyperdensity. Skull: No osseous abnormality identified. Sinuses/Orbits: Clear. Other: No acute orbit or scalp soft tissue findings. ASPECTS [facility] Stroke Program Early CT Score) - Ganglionic level infarction (caudate, lentiform nuclei, internal capsule, insula, M1-M3 cortex): 7 - Supraganglionic infarction (M4-M6 cortex): 3 Total score (0-10 with 10 being normal): 10 IMPRESSION: 1. Normal for age non contrast CT appearance of the brain. , stable since 2009. 2. ASPECTS is 10. 3. Study discussed by telephone with Dr. Ranae Palms On 07/17/2016 at 15:23 . Electronically Signed   By: Odessa Fleming M.D.   On: 07/17/2016 15:24        Scheduled Meds: . aspirin  300 mg Rectal Daily   Or  . aspirin  325 mg Oral Daily  . enoxaparin (LOVENOX) injection  40 mg Subcutaneous Q24H  . metoprolol succinate  50 mg Oral QPM  . pantoprazole  40 mg Oral Daily  . potassium chloride  40 mEq Oral Once  . pravastatin  20 mg Oral q1800   Continuous Infusions:   LOS: 0 days    Time spent:    Zannie Cove, MD [facility] Pager [phone]  If 7PM-7AM, please contact night-coverage [email or web address] Password [email or web address] 07/18/2016, 1:39 PM

## 2016-07-18 NOTE — Consult Note (Signed)
Neurology Consultation Reason for Consult: Left sided numbness Referring Physician: Maryfrances Bunnellanford, C  CC: Left sided numbness  History is obtained from:Patient  HPI: Melanie [name] is a 82 y.o. female wWho presents with transient left-sided numbness and confusion earlier today. She states that she walks into a grocery store, and then forgot what she was doing there or how to go about her regular activities. She did not have her cell phone, and went to call her daughter whom she calls every day(manually) but could not remember the number. She then was going to ask someone else to call her own number, but could not remember her own. She does not think that she had any difficulty with speech. She noticed that her left side was numb during this episode as well. The symptoms completely resolved, but she was very disconcerted within and therefore presented to the emergency room.  She also notes a couple of episodes in the past where just for a second or 2, she will have some difficulty with finding the words that she wants.   LKW: Earlier this afternoon tpa given?: no, Resolved symptoms    ROS: A 14 point ROS was performed and is negative except as noted in the HPI.   Past Medical History:  Diagnosis Date  . Bruises easily    on Aspirin;on hold for surgery  . Carotid stenosis   . Chronic back pain    stenosis  . Chronic sinusitis   . Diverticulosis   . Esophageal stricture   . GERD (gastroesophageal reflux disease)    takes Protonix daily  . Heart murmur    as a child  . Hemorrhoids   . History of colon polyps   . Hypertension    takes Amlodipine,HCTZ,and Metoprolol  . Insomnia   . Peripheral edema   . Schatzki's ring   . Scoliosis      Family History  Problem Relation Age of Onset  . Rheum arthritis Mother   . Renal cancer Sister   . CAD Brother   . Lung cancer Brother      Social History:  reports that she has never smoked. She has never used smokeless tobacco. She  reports that she does not drink alcohol or use drugs.   Exam: Current vital signs: BP (!) 155/60 (BP Location: Left Arm)   Pulse 64   Temp 99.3 F (37.4 C) (Oral)   Resp 18   Ht 4\' 11"  (1.499 m)   Wt 45.9 kg (101 lb 3.1 oz)   SpO2 98%   BMI 20.44 kg/m  Vital signs in last 24 hours: Temp:  [98.4 F (36.9 C)-99.4 F (37.4 C)] 99.3 F (37.4 C) (11/22 2258) Pulse Rate:  [63-71] 64 (11/22 2258) Resp:  [14-21] 18 (11/22 2258) BP: (125-178)/(60-84) 155/60 (11/22 2258) SpO2:  [93 %-98 %] 98 % (11/22 2258) Weight:  [45.9 kg (101 lb 3.1 oz)] 45.9 kg (101 lb 3.1 oz) (11/22 2258)   Physical Exam  Constitutional: Appears well-developed and well-nourished.  Psych: Affect appropriate to situation Eyes: No scleral injection HENT: No OP obstrucion Head: Normocephalic.  Cardiovascular: Normal rate and regular rhythm.  Respiratory: Effort normal and breath sounds normal to anterior ascultation GI: Soft.  No distension. There is no tenderness.  Skin: WDI  Neuro: Mental Status: Patient is awake, alert, oriented to person, place, month, year, and situation. Patient is able to give a clear and coherent history. No signs of aphasia or neglect Cranial Nerves: II: Visual Fields are full. Pupils  are equal, round, and reactive to light.   III,IV, VI: EOMI without ptosis or diploplia.  V: Facial sensation is symmetric to temperature VII: Facial movement is symmetric.  VIII: hearing is intact to voice X: Uvula elevates symmetrically XI: Shoulder shrug is symmetric. XII: tongue is midline without atrophy or fasciculations.  Motor: Tone is normal. Bulk is normal. 5/5 strength was present in all four extremities.  Sensory: Sensation is symmetric to light touch and temperature in the arms and legs. Cerebellar: FNF intact bilaterally      I have reviewed labs in epic and the results pertinent to this consultation are: BMP-unremarkable, CBC-uunremarkable  I have reviewed the images  obtained:CT head-no acute findings  Impression: 82 year old female with transient left-sided numbness and confusion concerning for a nondominant parietal TIA. Given that she had preserved consciousness throughout, no evidence of seizure activity or think this would be less likely. I'm not certain about the previous episodes of just a second or 2 of word finding difficulty.  Recommendations: 1. HgbA1c, fasting lipid panel 2. MRI, MRA  of the brain without contrast 3. Frequent neuro checks 4. Echocardiogram 5. Carotid dopplers 6. Prophylactic therapy-Antiplatelet med: Aspirin - dose 325mg  PO or 300mg  PR 7. Risk factor modification 8. Telemetry monitoring 9. PT consult, OT consult, Speech consult 10. please page stroke NP  Or  PA  Or MD  M-F from 8am -4 pm starting 11/24 as this patient will be followed by the stroke team at this point.   You can look them up on [email or web address]      Ritta SlotMcNeill Kirkpatrick, MD [facility] [phone]  If 7pm- 7am, please page neurology on call as listed in AMION.

## 2016-07-18 NOTE — Progress Notes (Signed)
STROKE TEAM PROGRESS NOTE   HISTORY OF PRESENT ILLNESS (per record) Melanie [name] is a 82 y.o. female who presents with transient left-sided numbness and confusion earlier today. She states that she walks into a grocery store, and then forgot what she was doing there or how to go about her regular activities. She did not have her cell phone, and went to call her daughter whom she calls every day(manually) but could not remember the number. She then was going to ask someone else to call her own number, but could not remember her own. She does not think that she had any difficulty with speech. She noticed that her left side was numb during this episode as well. The symptoms completely resolved, but she was very disconcerted within and therefore presented to the emergency room. She also notes a couple of episodes in the past where just for a second or 2, she will have some difficulty with finding the words that she wants. She was LKW 07/17/2016, specific time not documented, was earlier in the afternoon. Patient was not administered IV t-PA secondary to resolved symptoms. She was admitted for further evaluation and treatment.   SUBJECTIVE (INTERVAL HISTORY) No family at bedside. She stated that her symptoms resolved now. She had word finding difficulty episode 3 weeks ago. She had heart palpitation starts and stops suddenly lately in the morning when she wakes up while still in bed.    OBJECTIVE Temp:  [97.9 F (36.6 C)-99.4 F (37.4 C)] 97.9 F (36.6 C) (11/23 0500) Pulse Rate:  [56-71] 63 (11/23 0500) Cardiac Rhythm: Normal sinus rhythm (11/22 2300) Resp:  [14-21] 18 (11/23 0500) BP: (116-178)/(60-84) 121/69 (11/23 0500) SpO2:  [93 %-100 %] 100 % (11/23 0500) Weight:  [45.9 kg (101 lb 3.1 oz)] 45.9 kg (101 lb 3.1 oz) (11/22 2258)  CBC:  Recent Labs Lab 07/17/16 1445 07/17/16 1509  WBC 8.3  --   NEUTROABS 5.5  --   HGB 13.6 14.6  HCT 39.8 43.0  MCV 93.0  --   PLT 246  --     Basic  Metabolic Panel:  Recent Labs Lab 07/17/16 1445 07/17/16 1508 07/17/16 1509  NA 139  --  140  K 3.3*  --  2.9*  CL 101  --  100*  CO2 29  --   --   GLUCOSE 109*  --  105*  BUN 14  --  14  CREATININE 0.96  --  0.90  CALCIUM 10.0  --   --   MG  --  2.2  --     Lipid Panel:    Component Value Date/Time   CHOL 181 07/18/2016 0253   TRIG 93 07/18/2016 0253   HDL 56 07/18/2016 0253   CHOLHDL 3.2 07/18/2016 0253   VLDL 19 07/18/2016 0253   LDLCALC 106 (H) 07/18/2016 0253   HgbA1c: No results found for: HGBA1C Urine Drug Screen:    Component Value Date/Time   LABOPIA NONE DETECTED 07/17/2016 1615   COCAINSCRNUR NONE DETECTED 07/17/2016 1615   LABBENZ NONE DETECTED 07/17/2016 1615   AMPHETMU NONE DETECTED 07/17/2016 1615   THCU NONE DETECTED 07/17/2016 1615   LABBARB NONE DETECTED 07/17/2016 1615      IMAGING I have personally reviewed the radiological images below and agree with the radiology interpretations.  Ct Head Code Stroke W/o Cm 07/17/2016 1. Normal for age non contrast CT appearance of the brain. , stable since 2009. 2. ASPECTS is 10.   Mr Brain Ilda BassetWo  Contrast Mr Maxine GlennMra Head/brain Wo Cm 07/18/2016 1. Acute nonhemorrhagic white matter infarct measurement 8 mm adjacent to the atrium of the left lateral ventricle immediately posterior to the sylvian fissure. 2. No significant right-sided infarct. 3. Mild atrophy and white matter disease is likely within normal limits for age otherwise. 4. Unremarkable MRA circle of Willis without significant proximal stenosis, aneurysm, or branch vessel occlusion.   Dg Chest Portable 1 View 07/17/2016 No acute abnormalities. Aortic atherosclerosis.   CUS 01/2016 - Bilateral: no significant ICA stenosis. Vertebral artery flow is antegrade.  TTE - Normal LV wall thickness with LVEF 60-65% and grade 1 diastolic   dysfunction. Mitral annular calcification with mild mitral   regurgitation. Moderate calcific aortic stenosis as outlined    above. Mild tricuspid regurgitation with PASP 28 mmHg. No obvious   PFO or ASD.  LE venous doppler - pending   PHYSICAL EXAM  Temp:  [97.9 F (36.6 C)-99.3 F (37.4 C)] 98 F (36.7 C) (11/23 1429) Pulse Rate:  [56-71] 70 (11/23 1429) Resp:  [17-21] 20 (11/23 1429) BP: (110-158)/(47-79) 110/47 (11/23 1429) SpO2:  [94 %-100 %] 100 % (11/23 1429) Weight:  [101 lb 3.1 oz (45.9 kg)] 101 lb 3.1 oz (45.9 kg) (11/22 2258)  General - Well nourished, well developed, in no apparent distress.  Ophthalmologic - Sharp disc margins OU.   Cardiovascular - Regular rate and rhythm.  Mental Status -  Level of arousal and orientation to time, place, and person were intact. Language including expression, naming, repetition, comprehension was assessed and found intact. Fund of Knowledge was assessed and was intact.  Cranial Nerves II - XII - II - Visual field intact OU. III, IV, VI - Extraocular movements intact. V - Facial sensation intact bilaterally. VII - Facial movement intact bilaterally. VIII - Hearing & vestibular intact bilaterally. X - Palate elevates symmetrically. XI - Chin turning & shoulder shrug intact bilaterally. XII - Tongue protrusion intact.  Motor Strength - The patient's strength was normal in all extremities and pronator drift was absent.  Bulk was normal and fasciculations were absent.   Motor Tone - Muscle tone was assessed at the neck and appendages and was normal.  Reflexes - The patient's reflexes were 1+ in all extremities and she had no pathological reflexes.  Sensory - Light touch, temperature/pinprick, vibration and proprioception, and Romberg testing were assessed and were symmetrical.    Coordination - The patient had normal movements in the hands and feet with no ataxia or dysmetria.  Tremor was absent.  Gait and Station - deferred   ASSESSMENT/PLAN Melanie [name] is a 82 y.o. female with history of HTN, mitral regurg, and mild carotid artery  disease presenting with transient aphasia and L sided numbness. She did not receive IV t-PA due to resolved symptoms.   Stroke:  left lateral ventricle infarct. However, she symptoms more indicating right brain involvement. She also had episode of word finding difficulty 3 weeks ago. She has episodes of heart palpitation come on and off suddenly. Concerning for embolic secondary to cardiac source, especially afib  MRI  L lateral ventricle small infarct  MRA  Unremarkable   Carotid Doppler  01/2016 unremarkable  2D Echo  EF 60-65%  LE venous doppler pending  Recommend TEE and loop recorder tomorrow. NPO midnight ordered.  LDL 106  HgbA1c pending  Lovenox 40 mg sq daily for VTE prophylaxis  Diet regular Room service appropriate? Yes; Fluid consistency: Thin  No antithrombotic prior to admission, now  on aspirin 325 mg daily.   Patient counseled to be compliant with her antithrombotic medications  Ongoing aggressive stroke risk factor management  Therapy recommendations:  HH PT  Disposition:  Return home  Hx of TIA  Word finding difficulty episode 3 weeks ago.   Heart palpitation  On and off suddenly  Usually happens in the morning before getting out of bed  Concerning for afib  Recommend TEE and loop recorder  Hypertension  Stable, within normal range  Long-term BP goal normotensive  Hyperlipidemia  Home meds:  No statin  LDL 106, goal < 70  On Pravachol 20 mg daily now   Continue statin at discharge  Other Stroke Risk Factors  Advanced age  Other Active Problems  Hypokalemia 2.9 - supplement  LBP  Hospital day # 0  Marvel Plan, MD PhD Stroke Neurology 07/18/2016 4:09 PM   To contact Stroke Continuity provider, please refer to [email or web address]. After hours, contact General Neurology

## 2016-07-18 NOTE — Progress Notes (Signed)
Echocardiogram 2D Echocardiogram has been performed.  Melanie [name] 07/18/2016, 9:29 AM

## 2016-07-18 NOTE — Progress Notes (Signed)
RN spoke to McIntoshKirkpatrick, Safeco Corporationeuro oncall MD, Toprol held.  Patients visual disturbance in right eye reported to MD. RN will continue to monitor.

## 2016-07-18 NOTE — Progress Notes (Signed)
Family member visiting, just reported that pt sees people out of her R eye in the room, combing her hair that aren't really there. When she closes her eyes, she doesn't see the people. The family wanted MD to be aware. Otherwise, pt alert/oriented x4.

## 2016-07-18 NOTE — Evaluation (Signed)
Physical Therapy Evaluation Patient Details Name: Melanie [name] MRN: [id number] DOB: 10/19/1923 Today's Date: 07/18/2016   History of Present Illness  Patient is a 82 yo female admitted 07/17/16 with Lt-sided numbness and confusion while in grocery store.   MRI report - Acute nonhemorrhagic white matter infarct measurement 8 mm adjacent to the atrium of the left lateral ventricle immediately posterior to the sylvian fissure.    Clinical Impression  Patient presents with problems listed below.  Will benefit from acute PT to maximize functional independence prior to discharge home.  Patient with slightly unsteady gait.  Recommend f/u HHPT at d/c for gait/balance training.    Follow Up Recommendations Home health PT;Supervision - Intermittent    Equipment Recommendations  None recommended by PT    Recommendations for Other Services       Precautions / Restrictions Precautions Precautions: None Precaution Comments: No h/o falls Restrictions Weight Bearing Restrictions: No      Mobility  Bed Mobility Overal bed mobility: Independent                Transfers Overall transfer level: Modified independent Equipment used: None             General transfer comment: Good static balance  Ambulation/Gait Ambulation/Gait assistance: Supervision Ambulation Distance (Feet): 200 Feet Assistive device: None Gait Pattern/deviations: Step-through pattern;Decreased stride length;Shuffle;Drifts right/left   Gait velocity interpretation: at or above normal speed for age/gender General Gait Details: Patient with slightly unsteady gait, drifting to both sides.    Stairs            Wheelchair Mobility    Modified Rankin (Stroke Patients Only) Modified Rankin (Stroke Patients Only) Pre-Morbid Rankin Score: No symptoms Modified Rankin: Slight disability     Balance Overall balance assessment: Needs assistance         Standing balance support: No upper extremity  supported;During functional activity Standing balance-Leahy Scale: Good Standing balance comment: Patient slightly unsteady during dynamic activities                             Pertinent Vitals/Pain Pain Assessment: Faces Faces Pain Scale: Hurts a little bit Pain Location: back - chronic Pain Descriptors / Indicators: Aching Pain Intervention(s): Monitored during session    Home Living Family/patient expects to be discharged to:: Private residence Living Arrangements: Alone Available Help at Discharge: Family;Available 24 hours/day (Family member staying with patient at this time.) Type of Home: House Home Access: Stairs to enter Entrance Stairs-Rails: Doctor, general practiceight;Left Entrance Stairs-Number of Steps: 3 Home Layout: One level Home Equipment: Environmental consultantWalker - 2 wheels;Cane - single point;Tub bench      Prior Function Level of Independence: Independent         Comments: Drives     Hand Dominance        Extremity/Trunk Assessment   Upper Extremity Assessment: Overall WFL for tasks assessed           Lower Extremity Assessment: Generalized weakness         Communication   Communication: No difficulties  Cognition Arousal/Alertness: Awake/alert Behavior During Therapy: WFL for tasks assessed/performed Overall Cognitive Status: Within Functional Limits for tasks assessed                      General Comments      Exercises     Assessment/Plan    PT Assessment Patient needs continued PT services  PT Problem List Decreased strength;Decreased  balance;Decreased mobility;Impaired sensation          PT Treatment Interventions DME instruction;Gait training;Stair training;Functional mobility training;Therapeutic activities;Balance training;Patient/family education    PT Goals (Current goals can be found in the Care Plan section)  Acute Rehab PT Goals Patient Stated Goal: To go home.  To be safe. PT Goal Formulation: With patient Time For Goal  Achievement: 07/25/16 Potential to Achieve Goals: Good    Frequency Min 3X/week   Barriers to discharge        Co-evaluation               End of Session   Activity Tolerance: Patient tolerated treatment well Patient left: in bed;with call bell/phone within reach;with bed alarm set Nurse Communication: Mobility status    Functional Assessment Tool Used: Clinical judgement Functional Limitation: Mobility: Walking and moving around Mobility: Walking and Moving Around Current Status ([id number](G8978): At least 1 percent but less than 20 percent impaired, limited or restricted Mobility: Walking and Moving Around Goal Status [phone](G8979): 0 percent impaired, limited or restricted    Time: 0826-0836 PT Time Calculation (min) (ACUTE ONLY): 10 min   Charges:   PT Evaluation $PT Eval Low Complexity: 1 Procedure     PT G Codes:   PT G-Codes **NOT FOR INPATIENT CLASS** Functional Assessment Tool Used: Clinical judgement Functional Limitation: Mobility: Walking and moving around Mobility: Walking and Moving Around Current Status (U9811(G8978): At least 1 percent but less than 20 percent impaired, limited or restricted Mobility: Walking and Moving Around Goal Status [phone](G8979): 0 percent impaired, limited or restricted    Vena AustriaSusan H Lindley Stachnik 07/18/2016, 8:58 AM Durenda HurtSusan H. Renaldo Fiddleravis, PT, [facility] Acute Rehab Services Pager [phone]

## 2016-07-19 ENCOUNTER — Encounter (HOSPITAL_COMMUNITY)
Admission: EM | Disposition: A | Discharge status: Home Health | Payer: Self-pay | Source: Home / Self Care | Attending: Internal Medicine

## 2016-07-19 ENCOUNTER — Encounter (HOSPITAL_COMMUNITY): Payer: Medicare Other

## 2016-07-19 ENCOUNTER — Other Ambulatory Visit: Payer: Self-pay | Admitting: Physician Assistant

## 2016-07-19 DIAGNOSIS — I639 Cerebral infarction, unspecified: Secondary | ICD-10-CM

## 2016-07-19 DIAGNOSIS — G451 Carotid artery syndrome (hemispheric): Secondary | ICD-10-CM

## 2016-07-19 DIAGNOSIS — R002 Palpitations: Secondary | ICD-10-CM

## 2016-07-19 LAB — HEMOGLOBIN A1C
HEMOGLOBIN A1C: 5.8 % — AB (ref 4.8–5.6)
Mean Plasma Glucose: 120 mg/dL

## 2016-07-19 SURGERY — ECHOCARDIOGRAM, TRANSESOPHAGEAL
Anesthesia: Moderate Sedation

## 2016-07-19 MED ORDER — POTASSIUM CHLORIDE 20 MEQ PO PACK
20.0000 meq | PACK | Freq: Two times a day (BID) | ORAL | Status: DC
  Administered 2016-07-19: 20 meq via ORAL
  Filled 2016-07-19 (×2): qty 1

## 2016-07-19 MED ORDER — ASPIRIN 325 MG PO TABS: 325.0000 mg | ORAL_TABLET | Freq: Every day | ORAL | Status: DC

## 2016-07-19 MED ORDER — PRAVASTATIN SODIUM 20 MG PO TABS: 20.0000 mg | ORAL_TABLET | Freq: Every day | ORAL | 0 refills | Status: DC

## 2016-07-19 MED ORDER — POTASSIUM CHLORIDE 20 MEQ PO PACK
20.0000 meq | PACK | Freq: Every day | ORAL | 0 refills | Status: DC
Start: 2016-07-19 — End: 2018-12-08

## 2016-07-19 NOTE — Progress Notes (Signed)
On further review of Mrs. Melanie [name]'s chart, she has a history of Shatzki's ring and prior esophageal stricture in 2008. Given her low weight and age with this history, she is a high risk for esophageal perforation with TEE. The risk outweighs the benefit at this point, therefore, I have cancelled the TEE. I agree with cardiac EP recommendations for a 30 day monitor in lieu of loop recorder which is reasonable. If she is found to have a-fib, one must be thoughtful about her candidacy for long-term anticoagulation and whether she would benefit from it.  Chrystie NoseKenneth C. Tanav Orsak, MD, [facility]FACC Attending Cardiologist [facility]

## 2016-07-19 NOTE — Progress Notes (Addendum)
STROKE TEAM PROGRESS NOTE   SUBJECTIVE (INTERVAL HISTORY) No family at bedside. No acute issue over night. TEE cancelled due to hx of esophageal stricture in 2008. EP declined loop but recommended 30 day cardiac monitoring.    OBJECTIVE Temp:  [97.5 F (36.4 C)-98.7 F (37.1 C)] 98.7 F (37.1 C) (11/24 1043) Pulse Rate:  [55-94] 57 (11/24 1043) Cardiac Rhythm: Heart block (11/23 1900) Resp:  [16-20] 20 (11/24 1043) BP: (110-153)/(47-84) 153/54 (11/24 1043) SpO2:  [96 %-100 %] 96 % (11/24 1043)  CBC:   Recent Labs Lab 07/17/16 1445 07/17/16 1509  WBC 8.3  --   NEUTROABS 5.5  --   HGB 13.6 14.6  HCT 39.8 43.0  MCV 93.0  --   PLT 246  --     Basic Metabolic Panel:   Recent Labs Lab 07/17/16 1445 07/17/16 1508 07/17/16 1509 07/18/16 0800  NA 139  --  140 137  K 3.3*  --  2.9* 3.2*  CL 101  --  100* 100*  CO2 29  --   --  27  GLUCOSE 109*  --  105* 138*  BUN 14  --  14 10  CREATININE 0.96  --  0.90 0.94  CALCIUM 10.0  --   --  9.4  MG  --  2.2  --   --     Lipid Panel:     Component Value Date/Time   CHOL 181 07/18/2016 0253   TRIG 93 07/18/2016 0253   HDL 56 07/18/2016 0253   CHOLHDL 3.2 07/18/2016 0253   VLDL 19 07/18/2016 0253   LDLCALC 106 (H) 07/18/2016 0253   HgbA1c: No results found for: HGBA1C Urine Drug Screen:     Component Value Date/Time   LABOPIA NONE DETECTED 07/17/2016 1615   COCAINSCRNUR NONE DETECTED 07/17/2016 1615   LABBENZ NONE DETECTED 07/17/2016 1615   AMPHETMU NONE DETECTED 07/17/2016 1615   THCU NONE DETECTED 07/17/2016 1615   LABBARB NONE DETECTED 07/17/2016 1615      IMAGING I have personally reviewed the radiological images below and agree with the radiology interpretations.  Ct Head Code Stroke W/o Cm 07/17/2016 1. Normal for age non contrast CT appearance of the brain. , stable since 2009. 2. ASPECTS is 10.   Mr Brain Wo Contrast Mr Maxine GlennMra Head/brain Wo Cm 07/18/2016 1. Acute nonhemorrhagic white matter infarct  measurement 8 mm adjacent to the atrium of the left lateral ventricle immediately posterior to the sylvian fissure.  2. No significant right-sided infarct.  3. Mild atrophy and white matter disease is likely within normal limits for age otherwise.  4. Unremarkable MRA circle of Willis without significant proximal stenosis, aneurysm, or branch vessel occlusion.   Dg Chest Portable 1 View 07/17/2016 No acute abnormalities. Aortic atherosclerosis.   CUS 01/2016 - Bilateral: no significant ICA stenosis. Vertebral artery flow is antegrade.  TTE - Normal LV wall thickness with LVEF 60-65% and grade 1 diastolic   dysfunction. Mitral annular calcification with mild mitral   regurgitation. Moderate calcific aortic stenosis as outlined   above. Mild tricuspid regurgitation with PASP 28 mmHg. No obvious   PFO or ASD.  LE venous doppler - pending   PHYSICAL EXAM  Temp:  [97.5 F (36.4 C)-98.7 F (37.1 C)] 98.7 F (37.1 C) (11/24 1043) Pulse Rate:  [55-94] 57 (11/24 1043) Resp:  [16-20] 20 (11/24 1043) BP: (110-153)/(47-84) 153/54 (11/24 1043) SpO2:  [96 %-100 %] 96 % (11/24 1043)  General - Well nourished, well developed, in  no apparent distress.  Ophthalmologic - Sharp disc margins OU.   Cardiovascular - Regular rate and rhythm.  Mental Status -  Level of arousal and orientation to time, place, and person were intact. Language including expression, naming, repetition, comprehension was assessed and found intact. Fund of Knowledge was assessed and was intact.  Cranial Nerves II - XII - II - Visual field intact OU. III, IV, VI - Extraocular movements intact. V - Facial sensation intact bilaterally. VII - Facial movement intact bilaterally. VIII - Hearing & vestibular intact bilaterally. X - Palate elevates symmetrically. XI - Chin turning & shoulder shrug intact bilaterally. XII - Tongue protrusion intact.  Motor Strength - The patient's strength was normal in all extremities  and pronator drift was absent.  Bulk was normal and fasciculations were absent.   Motor Tone - Muscle tone was assessed at the neck and appendages and was normal.  Reflexes - The patient's reflexes were 1+ in all extremities and she had no pathological reflexes.  Sensory - Light touch, temperature/pinprick, vibration and proprioception, and Romberg testing were assessed and were symmetrical.    Coordination - The patient had normal movements in the hands and feet with no ataxia or dysmetria.  Tremor was absent.  Gait and Station - deferred   ASSESSMENT/PLAN Ms. Melanie [name] is a 82 y.o. female with history of HTN, mitral regurg, and mild carotid artery disease presenting with transient aphasia and L sided numbness. She did not receive IV t-PA due to resolved symptoms.   Stroke:  left lateral ventricle infarct. However, she symptoms more indicating right brain involvement. She also had episode of word finding difficulty 3 weeks ago. She has episodes of heart palpitation come on and off suddenly. Concerning for embolic secondary to cardiac source, especially afib  MRI  L lateral ventricle small infarct  MRA  Unremarkable   Carotid Doppler  01/2016 unremarkable  2D Echo  EF 60-65%  LE venous doppler pending  TEE - cancelled by Dr Rennis GoldenHilty due to history of Shatzki's ring and esophageal stricture.  EP recommends 30 day monitor instead of the loop implant.  LDL 106  HgbA1c pending  Lovenox 40 mg sq daily for VTE prophylaxis Diet NPO time specified  No antithrombotic prior to admission, now on aspirin 325 mg daily. Continue ASA 325mg  on discharge.  Patient counseled to be compliant with her antithrombotic medications  Ongoing aggressive stroke risk factor management  Therapy recommendations:  HH PT  Disposition:  Return home  Hx of TIA  Word finding difficulty episode 3 weeks ago.   Pt plan to have 30 day cardiac event monitoring by cardiology  Heart palpitation  On  and off suddenly  Usually happens in the morning before getting out of bed  Concerning for afib  TEE - cancelled by Dr Rennis GoldenHilty due to history of Shatzki's ring and esophageal stricture.  EP recommends 30 day monitor instead of the loop implant.  Hypertension  Stable, within normal range  Long-term BP goal normotensive  Hyperlipidemia  Home meds:  No statin  LDL 106, goal < 70  On Pravachol 20 mg daily now   Continue statin at discharge  Other Stroke Risk Factors  Advanced age  Other Active Problems  Hypokalemia  LBP  Hospital day # 1  Neurology will sign off. Please call with questions. [facility]. Thanks for the consult.  Marvel PlanJindong Estrellita Lasky, MD PhD Stroke Neurology 07/19/2016 3:08 PM  To contact Stroke Continuity provider, please refer to [email or web address] After hours, contact General Neurology

## 2016-07-19 NOTE — Progress Notes (Signed)
D/c instruction reviewed with patient and family.

## 2016-07-19 NOTE — H&P (Signed)
    INTERVAL PROCEDURE H&P  History and Physical Interval Note:  07/19/2016 8:40 AM  Melanie [name] has presented today for their planned procedure. The various methods of treatment have been discussed with the patient and family. After consideration of risks, benefits and other options for treatment, the patient has consented to the procedure.  The patients' outpatient history has been reviewed, patient examined, and no change in status from most recent office note within the past 30 days. I have reviewed the patients' chart and labs and will proceed as planned. Questions were answered to the patient's satisfaction.   Melanie NoseKenneth C. Hilty, MD, [facility]ACC Attending Cardiologist [facility]  Melanie [name] 07/19/2016, 8:40 AM

## 2016-07-19 NOTE — Discharge Summary (Signed)
Physician Discharge Summary  Lennox LaityGladys R Houchen ZOX:[id number] DOB: 05/09/1924 DOA: 07/17/2016  PCP: Gwen PoundsUSSO,JOHN M, MD  Admit date: 07/17/2016 Discharge date: 07/19/2016  Time spent: 35 minutes  Recommendations for Outpatient Follow-up:  1. 30day monitor for occult Afib, [facility] 2. Neurology Dr.Xu in 1 month   Discharge Diagnoses:    Acute CVA   Hemispheric carotid artery syndrome   Essential hypertension   Hypokalemia   TIA (transient ischemic attack)   Hyperlipidemia   Cerebrovascular accident (CVA) due to embolism of left middle cerebral artery (HCC)   Acute CVA (cerebrovascular accident) [facility](HCC)   Discharge Condition: stable  Diet recommendation: heart healthy  Filed Weights   07/17/16 2258  Weight: 45.9 kg (101 lb 3.1 oz)    History of present illness:  Venita SheffieldGladys R Redneris a 82 y.o.femalewho presents with transient left-sided numbness and confusion earlier today. She states that she walks into a grocery store, and then forgot what she was doing there or how to go about her regular activities. She did not have her cell phone, and went to call her daughter whom she calls every day(manually) but could not remember the number. She then was going to ask someone else to call her own number, but could not remember her own. She does not think that she had any difficulty with speech. She noticed that her left side was numb during this episode as well. The symptoms completely resolved, but she was very disconcerted within and therefore presented to the emergency room. She also notes a couple of episodes in the past where just for a second or 2, she will have some difficulty with finding the words that she wanted to use.  Hospital Course:   ACute CVA -no residual deficits -MRI with left lateral ventricleinfarct  -on same side as her symptoms, hence doesn't correlate -Neuro consulted, recommended TEE and Loop recorder, due to history of Shatzki's ring and  prior esophageal stricture in 2008 cardiology felt risks of TEE outweighed benefits and hence cancelled TEE. -EP recommended 30day monitor instead of Loop recorder. -ECHO EF 60% -Recent carotid duplex 6/23 without significant stenosis -LDL 106-started statin, Hba1c 5.8 -PT/OT/SLP consulted -home health PT recommended -[facility] consulted for 30day monitor  Hypertension -stable, continue metoprolol -resumed amlodipine and HCTZ at discharge  Hyperlipidemia -LDL 106 started statin  Hypokalemia 2.9 -due to HCTZ,  -replaced  Consultations:  Neurology  Discharge Exam: Vitals:   07/19/16 0640 07/19/16 1043  BP: 140/66 (!) 153/54  Pulse: (!) 55 (!) 57  Resp: 18 20  Temp: 97.8 F (36.6 C) 98.7 F (37.1 C)    General: AAOx3 Cardiovascular: S1S2/RRR Respiratory: CTAB  Discharge Instructions   Discharge Instructions    Diet - low sodium heart healthy    Complete by:  As directed    Diet - low sodium heart healthy    Complete by:  As directed    Increase activity slowly    Complete by:  As directed    Increase activity slowly    Complete by:  As directed      Current Discharge Medication List    START taking these medications   Details  aspirin 325 MG tablet Take 1 tablet (325 mg total) by mouth daily.    potassium chloride (KLOR-CON) 20 MEQ packet Take 20 mEq by mouth daily. Qty: 30 tablet, Refills: 0    pravastatin (PRAVACHOL) 20 MG tablet Take 1 tablet (20 mg total) by mouth daily at 6 PM. Qty:  30 tablet, Refills: 0      CONTINUE these medications which have NOT CHANGED   Details  amLODipine (NORVASC) 5 MG tablet Take 2.5 mg by mouth 2 (two) times daily.     ASCORBIC ACID PO Take 1 tablet by mouth daily.    BIOTIN PO Take by mouth as directed.    Cholecalciferol (VITAMIN D3) 2000 UNITS TABS Take 1 tablet by mouth daily.    FLUZONE HIGH-DOSE 0.5 ML SUSY Inject as directed once.    hydrochlorothiazide (HYDRODIURIL) 25 MG tablet Take 25 mg by  mouth daily.    lidocaine (LIDODERM) 5 % Place 1 patch onto the skin as needed (for pain.). Remove & Discard patch within 12 hours or as directed by MD    metoprolol succinate (TOPROL-XL) 50 MG 24 hr tablet Take 50 mg by mouth every evening. Take with or immediately following a meal.    MISC NATURAL PRODUCTS PO Take by mouth.    Multiple Vitamin (MULTIVITAMIN) tablet Take 1 tablet by mouth daily.    pantoprazole (PROTONIX) 40 MG tablet Take 40 mg by mouth daily.      STOP taking these medications     SELENIUM PO        No Known Allergies Follow-up Information    Gwen Pounds, MD. Schedule an appointment as soon as possible for a visit in 1 week(s).   Specialty:  Internal Medicine Contact information: [address] [place] [address] [phone]        Xu,Jindong, MD. Schedule an appointment as soon as possible for a visit in 1 month(s).   Specialty:  Neurology Contact information: [address] [place] [address] [phone]        [facility] Follow up.   Why:  Office will call you for 30day monitor           The results of significant diagnostics from this hospitalization (including imaging, microbiology, ancillary and laboratory) are listed below for reference.    Significant Diagnostic Studies: Mr Brain Wo Contrast  Result Date: 07/18/2016 CLINICAL DATA:  Episode of transient left-sided numbness in confusion yesterday. The patient had transient loss of memory as well. Symptoms have since resolved. EXAM: MRI HEAD WITHOUT CONTRAST MRA HEAD WITHOUT CONTRAST TECHNIQUE: Multiplanar, multiecho pulse sequences of the brain and surrounding structures were obtained without intravenous contrast. Angiographic images of the head were obtained using MRA technique without contrast. COMPARISON:  CT head without contrast 07/15/2016 FINDINGS: MRI HEAD FINDINGS Brain: A 9 mm acute/ subacute nonhemorrhagic infarct is present just posterior to the  sylvian fissure and the left parietal lobe no significant right-sided infarct is present. Mild atrophy and diffuse white matter changes are present bilaterally. Dilated perivascular spaces are evident throughout the basal ganglia. The brainstem and cerebellum are unremarkable. The internal auditory canals are normal bilaterally. Vascular: Flow is present in the major intracranial arteries. Skull and upper cervical spine: The skullbase is within normal limits. Craniocervical junction is normal. Central stenosis is present in the cervical spine at C4-5 due to a broad-based disc osteophyte complex. The upper cervical spine is otherwise unremarkable. Midline sagittal structures are otherwise within normal limits. Sinuses/Orbits: Mild mucosal thickening is present in the maxillary sinuses. The remaining paranasal sinuses and the mastoid air cells are clear. Bilateral lens replacements are present. The globes and orbits are otherwise intact. MRA HEAD FINDINGS Internal carotid arteries are within normal limits from the high cervical segments through the ICA termini bilaterally. There is mild narrowing  in the distal right A1 segment. The left A1 segment and bilateral M1 segments are normal. The MCA bifurcations are intact. ACA and MCA branch vessels are within normal limits. Anterior communicating artery is patent. The left vertebral artery is slightly dominant to the right. Left PICA origin is visualized and normal. The right AICA is dominant. The left posterior cerebral artery originates from the basilar tip. The right posterior cerebral artery is fed by a P1 segment and prominent posterior communicating artery. The PCA branch vessels are within normal limits bilaterally. IMPRESSION: 1. Acute nonhemorrhagic white matter infarct measurement 8 mm adjacent to the atrium of the left lateral ventricle immediately posterior to the sylvian fissure. 2. No significant right-sided infarct. 3. Mild atrophy and white matter disease  is likely within normal limits for age otherwise. 4. Unremarkable MRA circle of Willis without significant proximal stenosis, aneurysm, or branch vessel occlusion. Electronically Signed   By: Marin Robertshristopher  Mattern M.D.   On: 07/18/2016 07:19   Dg Chest Portable 1 View  Result Date: 07/17/2016 CLINICAL DATA:  Confusion, LEFT arm and leg numbness today, hypertension, TIA EXAM: PORTABLE CHEST 1 VIEW COMPARISON:  Portable exam 1523 hours compared to 01/22/2013 FINDINGS: Normal heart size, mediastinal contours, and pulmonary vascularity. Atherosclerotic calcification aorta. Lungs clear. No pleural effusion or pneumothorax. Bones demineralized. BILATERAL breast prostheses. IMPRESSION: No acute abnormalities. Aortic atherosclerosis. Electronically Signed   By: Ulyses SouthwardMark  Boles M.D.   On: 07/17/2016 15:47   Mr Maxine GlennMra Head/brain EAWo Cm  Result Date: 07/18/2016 CLINICAL DATA:  Episode of transient left-sided numbness in confusion yesterday. The patient had transient loss of memory as well. Symptoms have since resolved. EXAM: MRI HEAD WITHOUT CONTRAST MRA HEAD WITHOUT CONTRAST TECHNIQUE: Multiplanar, multiecho pulse sequences of the brain and surrounding structures were obtained without intravenous contrast. Angiographic images of the head were obtained using MRA technique without contrast. COMPARISON:  CT head without contrast 07/15/2016 FINDINGS: MRI HEAD FINDINGS Brain: A 9 mm acute/ subacute nonhemorrhagic infarct is present just posterior to the sylvian fissure and the left parietal lobe no significant right-sided infarct is present. Mild atrophy and diffuse white matter changes are present bilaterally. Dilated perivascular spaces are evident throughout the basal ganglia. The brainstem and cerebellum are unremarkable. The internal auditory canals are normal bilaterally. Vascular: Flow is present in the major intracranial arteries. Skull and upper cervical spine: The skullbase is within normal limits. Craniocervical  junction is normal. Central stenosis is present in the cervical spine at C4-5 due to a broad-based disc osteophyte complex. The upper cervical spine is otherwise unremarkable. Midline sagittal structures are otherwise within normal limits. Sinuses/Orbits: Mild mucosal thickening is present in the maxillary sinuses. The remaining paranasal sinuses and the mastoid air cells are clear. Bilateral lens replacements are present. The globes and orbits are otherwise intact. MRA HEAD FINDINGS Internal carotid arteries are within normal limits from the high cervical segments through the ICA termini bilaterally. There is mild narrowing in the distal right A1 segment. The left A1 segment and bilateral M1 segments are normal. The MCA bifurcations are intact. ACA and MCA branch vessels are within normal limits. Anterior communicating artery is patent. The left vertebral artery is slightly dominant to the right. Left PICA origin is visualized and normal. The right AICA is dominant. The left posterior cerebral artery originates from the basilar tip. The right posterior cerebral artery is fed by a P1 segment and prominent posterior communicating artery. The PCA branch vessels are within normal limits bilaterally. IMPRESSION: 1. Acute nonhemorrhagic  white matter infarct measurement 8 mm adjacent to the atrium of the left lateral ventricle immediately posterior to the sylvian fissure. 2. No significant right-sided infarct. 3. Mild atrophy and white matter disease is likely within normal limits for age otherwise. 4. Unremarkable MRA circle of Willis without significant proximal stenosis, aneurysm, or branch vessel occlusion. Electronically Signed   By: Marin Roberts M.D.   On: 07/18/2016 07:19   Ct Head Code Stroke W/o Cm  Result Date: 07/17/2016 CLINICAL DATA:  Code stroke. 82 year old female with confusion, transient numbness. Initial encounter. EXAM: CT HEAD WITHOUT CONTRAST TECHNIQUE: Contiguous axial images were  obtained from the base of the skull through the vertex without intravenous contrast. COMPARISON:  Head CT without contrast 09/14/2007 FINDINGS: Brain: Cerebral volume remains normal for age. No midline shift, ventriculomegaly, mass effect, evidence of mass lesion, intracranial hemorrhage or evidence of cortically based acute infarction. Gray-white matter differentiation is normal for age throughout the brain. No cortical encephalomalacia. Vascular: Calcified atherosclerosis at the skull base. No suspicious intracranial vascular hyperdensity. Skull: No osseous abnormality identified. Sinuses/Orbits: Clear. Other: No acute orbit or scalp soft tissue findings. ASPECTS [facility] Stroke Program Early CT Score) - Ganglionic level infarction (caudate, lentiform nuclei, internal capsule, insula, M1-M3 cortex): 7 - Supraganglionic infarction (M4-M6 cortex): 3 Total score (0-10 with 10 being normal): 10 IMPRESSION: 1. Normal for age non contrast CT appearance of the brain. , stable since 2009. 2. ASPECTS is 10. 3. Study discussed by telephone with Dr. Ranae Palms On 07/17/2016 at 15:23 . Electronically Signed   By: Odessa Fleming M.D.   On: 07/17/2016 15:24    Microbiology: No results found for this or any previous visit (from the past 240 hour(s)).   Labs: Basic Metabolic Panel:  Recent Labs Lab 07/17/16 1445 07/17/16 1508 07/17/16 1509 07/18/16 0800  NA 139  --  140 137  K 3.3*  --  2.9* 3.2*  CL 101  --  100* 100*  CO2 29  --   --  27  GLUCOSE 109*  --  105* 138*  BUN 14  --  14 10  CREATININE 0.96  --  0.90 0.94  CALCIUM 10.0  --   --  9.4  MG  --  2.2  --   --    Liver Function Tests:  Recent Labs Lab 07/17/16 1445  AST 24  ALT 11*  ALKPHOS 83  BILITOT 0.8  PROT 7.0  ALBUMIN 4.3   No results for input(s): LIPASE, AMYLASE in the last 168 hours. No results for input(s): AMMONIA in the last 168 hours. CBC:  Recent Labs Lab 07/17/16 1445 07/17/16 1509  WBC 8.3  --   NEUTROABS 5.5  --    HGB 13.6 14.6  HCT 39.8 43.0  MCV 93.0  --   PLT 246  --    Cardiac Enzymes: No results for input(s): CKTOTAL, CKMB, CKMBINDEX, TROPONINI in the last 168 hours. BNP: BNP (last 3 results) No results for input(s): BNP in the last 8760 hours.  ProBNP (last 3 results) No results for input(s): PROBNP in the last 8760 hours.  CBG:  Recent Labs Lab 07/17/16 1444  GLUCAP 103*       SignedZannie Cove MD.  [facility] 07/19/2016, 2:16 PM

## 2016-07-19 NOTE — Care Management Note (Signed)
Case Management Note  Patient Details  Name: Melanie [name] MRN: [id number] Date of Birth: 1924/02/17  Subjective/Objective:                    Action/Plan: Plan is for patient to discharge home later today. MD ordering [facility] services. CM met with the patient and provided her a list of [place] agencies in the [place] area. She stated she has used [place] before and would like to use them again. Karolee Stamps with [facility] notified and accepted the referral. No DME needs.   Expected Discharge Date:  07/18/16               Expected Discharge Plan:  [place]  In-House Referral:     Discharge planning Services  CM Consult  Post Acute Care Choice:  Home Health Choice offered to:  Patient  DME Arranged:    DME Agency:     HH Arranged:  PT, OT HH Agency:  [place]  Status of Service:  Completed, signed off  If discussed at [place] of Stay Meetings, dates discussed:    Additional Comments:  Pollie Friar, RN 07/19/2016, 11:13 AM

## 2016-07-19 NOTE — Consult Note (Signed)
ELECTROPHYSIOLOGY CONSULT NOTE  Patient ID: Melanie [name] MRN: [id number], DOB/AGE: 80/02/1924   Admit date: 07/17/2016 Date of Consult: 07/19/2016  Primary Physician: Gwen PoundsUSSO,JOHN M, MD Primary Cardiologist: Dr. Elease HashimotoNahser Reason for Consultation: Cryptogenic stroke ; recommendations regarding Implantable Loop Recorder  History of Present Illness Melanie LaityGladys R Chizek was admitted on 07/17/2016 with CVA.  They first developed symptoms while walking into a store, forgot why she was there, could not recall phone numbers usually very familiar with.   PMHx includes HTN, mild VHD, minimal carotid disease.   Imaging demonstrated left lateral ventricle infarct. Neurology noted though : However, she symptoms more indicating right brain involvement. She also had episode of word finding difficulty 3 weeks ago. She has episodes of heart palpitation come on and off suddenly. Concerning for embolic secondary to cardiac source, especially afib.  she has undergone workup for stroke including echocardiogram and carotid dopplers (June 2017).  The patient has been monitored on telemetry which has demonstrated sinus rhythm with no arrhythmias.  Inpatient stroke work-up is to be completed with a TEE.   She is feeling very well and back to her baseline today.  Echocardiogram this admission demonstrated  Impressions: - Normal LV wall thickness with LVEF 60-65% and grade 1 diastolic   dysfunction. Mitral annular calcification with mild mitral   regurgitation. Moderate calcific aortic stenosis as outlined   above. Mild tricuspid regurgitation with PASP 28 mmHg. No obvious   PFO or ASD.  Lab work is reviewed.  Prior to admission, the patient denies chest pain, shortness of breath, dizziness, or syncope.  She dose though have hx of palpitations.  She reports her husband died in May, since then feels stress though only in the last 4-8 weeks she is waking with palpitations and in-fact uses the term AFib in describing them.   She is a retired Chief Operating Officerhealth/science teacher and familiar with the the term/arrhythmia through her health and A&P teachings only, she herself has never been known to have it.  She describes an erratic heartbeat that lasts several minutes, at least weekly, she saw Dr. Elease HashimotoNahser only a couple weeks ago, EKG was SR.    EP has been asked to evaluate for placement of an implantable loop recorder to monitor for atrial fibrillation.     Past Medical History:  Diagnosis Date  . Bruises easily    on Aspirin;on hold for surgery  . Carotid stenosis   . Chronic back pain    stenosis  . Chronic sinusitis   . Diverticulosis   . Esophageal stricture   . GERD (gastroesophageal reflux disease)    takes Protonix daily  . Heart murmur    as a child  . Hemorrhoids   . History of colon polyps   . Hypertension    takes Amlodipine,HCTZ,and Metoprolol  . Insomnia   . Peripheral edema   . Schatzki's ring   . Scoliosis      Surgical History:  Past Surgical History:  Procedure Laterality Date  . BREAST ENHANCEMENT SURGERY    . CHOLECYSTECTOMY    . COLONOSCOPY    . DILATION AND CURETTAGE OF UTERUS    . epidural injections    . ESOPHAGOGASTRODUODENOSCOPY    . EYE SURGERY     left cataract removed  . LUMBAR LAMINECTOMY/DECOMPRESSION MICRODISCECTOMY Left 01/26/2013   Procedure: LUMBAR LAMINECTOMY/DECOMPRESSION MICRODISCECTOMY 1 LEVEL;  Surgeon: Barnett AbuHenry [name], MD;  Location: [facility];  Service: Neurosurgery;  Laterality: Left;  Left Lumbar four-five Laminectomy/Foraminotomy  . steroid  injections     x 3  . TOTAL HIP ARTHROPLASTY Right      Prescriptions Prior to Admission  Medication Sig Dispense Refill Last Dose  . amLODipine (NORVASC) 5 MG tablet Take 2.5 mg by mouth 2 (two) times daily.    07/17/2016 at am  . ASCORBIC ACID PO Take 1 tablet by mouth daily.   Past Week at Unknown time  . BIOTIN PO Take by mouth as directed.   Unknown  . Cholecalciferol (VITAMIN D3) 2000 UNITS TABS Take 1 tablet by  mouth daily.   07/17/2016 at Unknown time  . FLUZONE HIGH-DOSE 0.5 ML SUSY Inject as directed once.   2017  . hydrochlorothiazide (HYDRODIURIL) 25 MG tablet Take 25 mg by mouth daily.   07/17/2016 at Unknown time  . lidocaine (LIDODERM) 5 % Place 1 patch onto the skin as needed (for pain.). Remove & Discard patch within 12 hours or as directed by MD   Past Month at Unknown time  . metoprolol succinate (TOPROL-XL) 50 MG 24 hr tablet Take 50 mg by mouth every evening. Take with or immediately following a meal.   07/16/2016 at 2000  . MISC NATURAL PRODUCTS PO Take by mouth.   Unknown  . Multiple Vitamin (MULTIVITAMIN) tablet Take 1 tablet by mouth daily.   Past Month at Unknown time  . pantoprazole (PROTONIX) 40 MG tablet Take 40 mg by mouth daily.   07/17/2016 at am  . SELENIUM PO Take by mouth as directed.   Unknown    Inpatient Medications:  . aspirin  300 mg Rectal Daily   Or  . aspirin  325 mg Oral Daily  . enoxaparin (LOVENOX) injection  30 mg Subcutaneous Q24H  . metoprolol succinate  50 mg Oral QPM  . pantoprazole  40 mg Oral Daily  . pravastatin  20 mg Oral q1800    Allergies: No Known Allergies  Social History   Social History  . Marital status: Widowed    Spouse name: N/A  . Number of children: 2  . Years of education: N/A   Occupational History  . Retired    Social History Main Topics  . Smoking status: Never Smoker  . Smokeless tobacco: Never Used  . Alcohol use No  . Drug use: No  . Sexual activity: No   Other Topics Concern  . Not on file   Social History Narrative  . No narrative on file     Family History  Problem Relation Age of Onset  . Rheum arthritis Mother   . Renal cancer Sister   . CAD Brother   . Lung cancer Brother       Review of Systems: All other systems reviewed and are otherwise negative except as noted above.  Physical Exam: Vitals:   07/18/16 1835 07/18/16 2049 07/19/16 0145 07/19/16 0640  BP: 128/84 135/69 140/65 140/66    Pulse: 94 (!) 57 62 (!) 55  Resp: 16 18 18 18   Temp: 98.2 F (36.8 C) 98.6 F (37 C) 97.5 F (36.4 C) 97.8 F (36.6 C)  TempSrc: Oral Oral Oral Oral  SpO2: 100% 97% 100% 96%  Weight:      Height:        GEN- The patient is well appearing, alert and oriented x 3 today.   Head- normocephalic, atraumatic Eyes-  Sclera clear, conjunctiva pink Ears- hearing intact Oropharynx- clear Neck- supple Lungs- Clear to ausculation bilaterally, normal work of breathing Heart- Regular rate and rhythm, no murmurs,  rubs or gallops  GI- soft, NT, ND Extremities- no clubbing, cyanosis, or edema MS- no significant deformity age appropriate atrophy Skin- no rash or lesion Psych- euthymic mood, full affect   Labs:   Lab Results  Component Value Date   WBC 8.3 07/17/2016   HGB 14.6 07/17/2016   HCT 43.0 07/17/2016   MCV 93.0 07/17/2016   PLT 246 07/17/2016    Recent Labs Lab 07/17/16 1445  07/18/16 0800  NA 139  < > 137  K 3.3*  < > 3.2*  CL 101  < > 100*  CO2 29  --  27  BUN 14  < > 10  CREATININE 0.96  < > 0.94  CALCIUM 10.0  --  9.4  PROT 7.0  --   --   BILITOT 0.8  --   --   ALKPHOS 83  --   --   ALT 11*  --   --   AST 24  --   --   GLUCOSE 109*  < > 138*  < > = values in this interval not displayed. No results found for: CKTOTAL, CKMB, CKMBINDEX, TROPONINI Lab Results  Component Value Date   CHOL 181 07/18/2016   Lab Results  Component Value Date   HDL 56 07/18/2016   Lab Results  Component Value Date   LDLCALC 106 (H) 07/18/2016   Lab Results  Component Value Date   TRIG 93 07/18/2016   Lab Results  Component Value Date   CHOLHDL 3.2 07/18/2016   No results found for: LDLDIRECT  No results found for: DDIMER   Radiology/Studies:  Mr Brain Wo Contrast Result Date: 07/18/2016 CLINICAL DATA:  Episode of transient left-sided numbness in confusion yesterday. The patient had transient loss of memory as well. Symptoms have since resolved. EXAM: MRI HEAD  WITHOUT CONTRAST MRA HEAD WITHOUT CONTRAST TECHNIQUE: Multiplanar, multiecho pulse sequences of the brain and surrounding structures were obtained without intravenous contrast. Angiographic images of the head were obtained using MRA technique without contrast. COMPARISON:  CT head without contrast 07/15/2016 FINDINGS: MRI HEAD FINDINGS Brain: A 9 mm acute/ subacute nonhemorrhagic infarct is present just posterior to the sylvian fissure and the left parietal lobe no significant right-sided infarct is present. Mild atrophy and diffuse white matter changes are present bilaterally. Dilated perivascular spaces are evident throughout the basal ganglia. The brainstem and cerebellum are unremarkable. The internal auditory canals are normal bilaterally. Vascular: Flow is present in the major intracranial arteries. Skull and upper cervical spine: The skullbase is within normal limits. Craniocervical junction is normal. Central stenosis is present in the cervical spine at C4-5 due to a broad-based disc osteophyte complex. The upper cervical spine is otherwise unremarkable. Midline sagittal structures are otherwise within normal limits. Sinuses/Orbits: Mild mucosal thickening is present in the maxillary sinuses. The remaining paranasal sinuses and the mastoid air cells are clear. Bilateral lens replacements are present. The globes and orbits are otherwise intact. MRA HEAD FINDINGS Internal carotid arteries are within normal limits from the high cervical segments through the ICA termini bilaterally. There is mild narrowing in the distal right A1 segment. The left A1 segment and bilateral M1 segments are normal. The MCA bifurcations are intact. ACA and MCA branch vessels are within normal limits. Anterior communicating artery is patent. The left vertebral artery is slightly dominant to the right. Left PICA origin is visualized and normal. The right AICA is dominant. The left posterior cerebral artery originates from the basilar  tip. The right  posterior cerebral artery is fed by a P1 segment and prominent posterior communicating artery. The PCA branch vessels are within normal limits bilaterally. IMPRESSION: 1. Acute nonhemorrhagic white matter infarct measurement 8 mm adjacent to the atrium of the left lateral ventricle immediately posterior to the sylvian fissure. 2. No significant right-sided infarct. 3. Mild atrophy and white matter disease is likely within normal limits for age otherwise. 4. Unremarkable MRA circle of Willis without significant proximal stenosis, aneurysm, or branch vessel occlusion. Electronically Signed   By: Marin Robertshristopher  Mattern M.D.   On: 07/18/2016 07:19   Dg Chest Portable 1 View Result Date: 07/17/2016 CLINICAL DATA:  Confusion, LEFT arm and leg numbness today, hypertension, TIA EXAM: PORTABLE CHEST 1 VIEW COMPARISON:  Portable exam 1523 hours compared to 01/22/2013 FINDINGS: Normal heart size, mediastinal contours, and pulmonary vascularity. Atherosclerotic calcification aorta. Lungs clear. No pleural effusion or pneumothorax. Bones demineralized. BILATERAL breast prostheses. IMPRESSION: No acute abnormalities. Aortic atherosclerosis. Electronically Signed   By: Ulyses SouthwardMark  Boles M.D.   On: 07/17/2016 15:47     12-lead ECG SR All prior EKG's in EPIC reviewed with no documented atrial fibrillation Telemetry SR only  Assessment and Plan:  1. Cryptogenic stroke The patient presents with cryptogenic stroke.  The patient has a TEE planned for this AM.  I spoke at length with the patient about monitoring for afib with either a 30 day event monitor or an implantable loop recorder.  Given her palpitations, weekly (perhaps more), recommend starting with a 30 day monitor, the patient as well would like to pursue this option and consider loop only if no findings with the event monitor.  EP remains available, please recall if needed.  Sheilah PigeonRenee Lynn Ursuy, PA-C 07/19/2016   I have seen and examined this patient  with Francis Dowseenee Ursuy.  Agree with above, note added to reflect my findings.  On exam, regular rhythm, no murmurs, lungs clear. Had stroke with memory issues which has improved since her diagnosis. Says that she has episodic palpitations which could possibly be due to atrial fibrillation.  Ladd Cen plan for 30 day monitor instead of LINQ monitoring at this time.    Alizza Sacra M. Kaelene Elliston MD 07/19/2016 12:51 PM

## 2016-07-24 ENCOUNTER — Other Ambulatory Visit: Payer: Self-pay | Admitting: Physician Assistant

## 2016-07-24 DIAGNOSIS — I639 Cerebral infarction, unspecified: Secondary | ICD-10-CM

## 2016-07-24 DIAGNOSIS — I4891 Unspecified atrial fibrillation: Secondary | ICD-10-CM

## 2016-07-25 ENCOUNTER — Ambulatory Visit (INDEPENDENT_AMBULATORY_CARE_PROVIDER_SITE_OTHER): Payer: Medicare Other

## 2016-07-25 DIAGNOSIS — I4891 Unspecified atrial fibrillation: Secondary | ICD-10-CM

## 2016-07-25 DIAGNOSIS — I639 Cerebral infarction, unspecified: Secondary | ICD-10-CM

## 2016-07-29 ENCOUNTER — Other Ambulatory Visit: Payer: Self-pay

## 2016-07-29 NOTE — Patient Outreach (Signed)
[facility]) Care Management  07/29/2016  Melanie LaityGladys R [name] 12/29/1923 [id number]   Referral Date: 07/25/2016 Source:  [facility] Stroke Program Issue:  [facility] H/o HTN, CVA, TIA, Hyperlipidemia, Palpitations, Mitral regurgitation,  Lumbar scoliosis,   [address]  [place] [phone] Melanie [name](H) [phone] (M)  Outreach call #1 to patient.   Patient reached and completed call.  Patient states she lives in her home with another lady but she provides some assistance with her needs in addition to her own.  Patient is able to complete all IADL's.  [facility] and PT active thus far.  Denies any falls prior to admission or post discharge. Admission:  07/17/2016 - 07/19/2016  Acute CVA.  Patient states she has all her medications and no issues getting meds.  Patient has not read her Stroke Recovery Booklet but states she will read it tonight.  Patient states she only noted not being able to get her words out for a few seconds on 3 different accounts and finally decided she needed to see the MD.  Patient confirms she is wearing a heart monitor for a 30 day evaluation of activity. Neurologist:  Dr. Marvel PlanJindong Xu - see in one month post discharge. PCP:  Dr. Creola CornJohn Russo   Encounter Medications:  Outpatient Encounter Prescriptions as of 07/29/2016  Medication Sig Note  . amLODipine (NORVASC) 5 MG tablet Take 2.5 mg by mouth 2 (two) times daily.    . ASCORBIC ACID PO Take 1 tablet by mouth daily.   Melanie [name]. aspirin 325 MG tablet Take 1 tablet (325 mg total) by mouth daily.   Melanie [name]. BIOTIN PO Take by mouth as directed. 07/17/2016: Pt endorses she does not take this frequently; just when she remembers it.   . Cholecalciferol (VITAMIN D3) 2000 UNITS TABS Take 1 tablet by mouth daily.   Melanie [name]. FLUZONE HIGH-DOSE 0.5 ML SUSY Inject as directed once. 07/17/2016: Up to date for 2017-2018 flu season.   . hydrochlorothiazide  (HYDRODIURIL) 25 MG tablet Take 25 mg by mouth daily.   Melanie [name]. lidocaine (LIDODERM) 5 % Place 1 patch onto the skin as needed (for pain.). Remove & Discard patch within 12 hours or as directed by MD   . metoprolol succinate (TOPROL-XL) 50 MG 24 hr tablet Take 50 mg by mouth every evening. Take with or immediately following a meal.   . MISC NATURAL PRODUCTS PO Take by mouth. 07/17/2016: Pt states she uses other supplements occasionally, but cannot recall them at this time.   . Multiple Vitamin (MULTIVITAMIN) tablet Take 1 tablet by mouth daily.   . pantoprazole (PROTONIX) 40 MG tablet Take 40 mg by mouth daily.   . potassium chloride (KLOR-CON) 20 MEQ packet Take 20 mEq by mouth daily.   . pravastatin (PRAVACHOL) 20 MG tablet Take 1 tablet (20 mg total) by mouth daily at 6 PM.    No facility-administered encounter medications on file as of 07/29/2016.     Functional Status:  In your present state of health, do you have any difficulty performing the following activities: 07/29/2016 07/17/2016  Hearing? Melanie JohnsY Y  Vision? N N  Difficulty concentrating or making decisions? N Y  Walking or climbing stairs? N N  Dressing or bathing? N N  Doing errands, shopping? N N  Preparing Food and eating ? N -  Using the Toilet? N -  In the past six months, have you accidently leaked urine? N -  Do you have problems with loss of bowel control? N -  Managing your Medications? N -  Managing your Finances? N -  Housekeeping or managing your Housekeeping? N -  Some recent data might be hidden    Fall/Depression Screening: PHQ 2/9 Scores 07/29/2016  PHQ - 2 Score 0    Plan:  RN CM provided verbal education on stroke symptoms and urgency management should symptoms present.  RN CM will review Stroke Recovery Booklet on next contact call after patient has had a chance to read.   RN CM provided education on fall prevention.  RN CM provided education on [facility] Stroke Program and advised in next contact call within one  week.  Patient appreciative of services.    Melanie Daviesrystal Linette [name], MSHL, BSN, RN, CCM  [facility] Care Management Care Management Coordinator [phone] Direct [phone] Cell [phone] Office [phone] Fax [name]

## 2016-07-31 ENCOUNTER — Other Ambulatory Visit: Payer: Self-pay

## 2016-07-31 NOTE — Patient Outreach (Signed)
[facility]) Care Management  07/31/2016  Ardine BjorkGladys R Montilla 03/30/1924 [id number]   [facility] Stroke Program Date: 07/29/16 Issue:  Questions/problems with meds.  [facility] eligible: No  [address]  [place] [id number] [phone] [facility](H) [phone] (M)  Outreach call #1 to patient.   Patient reached and completed call.   Providers: Neurologist:  Dr. Marvel PlanJindong Xu - see in one month post discharge but not on schedule in Epic.  PCP:  Dr. Creola CornJohn Russo - next appt pending in two weeks - 07/2016  Psycho/Social: Patient lives in her home with another lady but she provides some assistance with her needs in addition to her own.  Patient is able to complete all IADL's.  [facility] and PT active thus far.  Denies any falls prior to admission or post discharge.  Co-morbidities: H/o HTN, CVA, TIA, Hyperlipidemia, Palpitations, Mitral regurgitation,  Lumbar scoliosis,   Stroke:   Admission:  07/17/2016 - 07/19/2016  Acute CVA.  Patient states she has all her medications and no issues getting meds.  Patient confirms no medication issues.  Continues to wear 30 day heart monitor; no issues.   Encounter Medications:  Outpatient Encounter Prescriptions as of 07/31/2016  Medication Sig Note  . amLODipine (NORVASC) 5 MG tablet Take 2.5 mg by mouth 2 (two) times daily.    . ASCORBIC ACID PO Take 1 tablet by mouth daily.   Marland Kitchen. aspirin 325 MG tablet Take 1 tablet (325 mg total) by mouth daily.   Marland Kitchen. BIOTIN PO Take by mouth as directed. 07/17/2016: Pt endorses she does not take this frequently; just when she remembers it.   . Cholecalciferol (VITAMIN D3) 2000 UNITS TABS Take 1 tablet by mouth daily.   Marland Kitchen. FLUZONE HIGH-DOSE 0.5 ML SUSY Inject as directed once. 07/17/2016: Up to date for 2017-2018 flu season.   . hydrochlorothiazide (HYDRODIURIL) 25 MG tablet Take 25 mg by mouth daily.   Marland Kitchen. lidocaine (LIDODERM) 5 % Place 1 patch onto the skin as needed (for pain.).  Remove & Discard patch within 12 hours or as directed by MD   . metoprolol succinate (TOPROL-XL) 50 MG 24 hr tablet Take 50 mg by mouth every evening. Take with or immediately following a meal.   . MISC NATURAL PRODUCTS PO Take by mouth. 07/17/2016: Pt states she uses other supplements occasionally, but cannot recall them at this time.   . Multiple Vitamin (MULTIVITAMIN) tablet Take 1 tablet by mouth daily.   . pantoprazole (PROTONIX) 40 MG tablet Take 40 mg by mouth daily.   . potassium chloride (KLOR-CON) 20 MEQ packet Take 20 mEq by mouth daily.   . pravastatin (PRAVACHOL) 20 MG tablet Take 1 tablet (20 mg total) by mouth daily at 6 PM.    No facility-administered encounter medications on file as of 07/31/2016.     Functional Status:  In your present state of health, do you have any difficulty performing the following activities: 07/29/2016 07/17/2016  Hearing? Malvin JohnsY Y  Vision? N N  Difficulty concentrating or making decisions? N Y  Walking or climbing stairs? N N  Dressing or bathing? N N  Doing errands, shopping? N N  Preparing Food and eating ? N -  Using the Toilet? N -  In the past six months, have you accidently leaked urine? N -  Do you have problems with loss of bowel control? N -  Managing your Medications? N -  Managing your Finances? N -  Housekeeping or  managing your Housekeeping? N -  Some recent data might be hidden    Fall/Depression Screening: PHQ 2/9 Scores 07/29/2016  PHQ - 2 Score 0    Plan:  Neurologist:  Dr. Marvel PlanJindong Xu - see in one month post discharge but not on schedule in Epic.  RN CM will notify Dr. Warren DanesXu's office patient has not been contact for appointment date.   RN CM will review Stroke Recovery Booklet on next contact call after patient has had a chance to read.   RN CM provided education on [facility] Stroke Program and advised in next contact call within one week.  Patient appreciative of services.    Simmie Daviesrystal Jozi Malachi, MSHL, BSN, RN, CCM  [facility] Care Management Care Management Coordinator [phone] Direct [phone] Cell [phone] Office [phone] Fax [email or web address]

## 2016-07-31 NOTE — Patient Outreach (Signed)
[facility]) Care Management  07/31/2016  Ardine BjorkGladys R Kundrat 08/25/1924 [id number]   Telephonic [facility] Stroke Program   Contact call to Neurologist office:  Contact: Sandy Neurologist:  Dr. Marvel PlanJindong Xu - see in one month post discharge   (Admission:07/17/2016 - 07/19/2016  Acute CVA) but patient has no appoint scheduled in Epic.  Andrey CampanileSandy 10/15/16 first available; Andrey CampanileSandy scheduled with a different MD to accommodate 1 month follow-up order.  Appointment scheduled January 9th 3:30 pm  (check in at 3:00 pm) with Dr. Naomie DeanAntonia Ahern.   Plan:   RN CM will provide this appointment update to patient.   Simmie Daviesrystal Elim Economou, MSHL, BSN, RN, CCM  [facility] Care Management Care Management Coordinator [phone] Direct [phone] Cell [phone] Office [phone] Fax [email or web address]

## 2016-08-05 ENCOUNTER — Ambulatory Visit: Payer: Self-pay

## 2016-08-07 ENCOUNTER — Ambulatory Visit: Payer: Self-pay

## 2016-08-08 ENCOUNTER — Ambulatory Visit: Payer: Self-pay

## 2016-08-09 ENCOUNTER — Ambulatory Visit: Payer: Self-pay

## 2016-08-12 ENCOUNTER — Ambulatory Visit: Payer: Self-pay

## 2016-08-13 ENCOUNTER — Ambulatory Visit: Payer: Self-pay

## 2016-08-14 ENCOUNTER — Ambulatory Visit: Payer: Self-pay

## 2016-08-15 ENCOUNTER — Ambulatory Visit: Payer: Self-pay

## 2016-08-16 ENCOUNTER — Ambulatory Visit: Payer: Self-pay

## 2016-08-20 ENCOUNTER — Ambulatory Visit: Payer: Self-pay

## 2016-08-21 ENCOUNTER — Ambulatory Visit: Payer: Self-pay

## 2016-08-22 ENCOUNTER — Other Ambulatory Visit: Payer: Self-pay

## 2016-08-22 NOTE — Patient Outreach (Signed)
[facility]) Care Management  08/22/2016  Melanie [name] May 27, 1924 [id number]   [facility] Stroke Program Date: 07/29/16 [facility] eligible: No  [address]  [name] [address] [phone] [facility] (M) Outreach call #1 to patient.   Patient reached and completed call.   Providers: Neurologist:  Dr. Rosalin Hawking - see in one month post discharge but not on schedule in Epic.  Patient notified today:   Appointment scheduled January 9th 3:30 pm  (check in at 3:00 pm) with Dr. Sarina Ill. PCP:  Dr. Shon Baton - 07/2016  Psycho/Social: Patient lives in her home with another lady but she provides some assistance with her needs in addition to her own.  Patient is able to complete all IADL's.  States [facility] has initiated services and PT active thus far.  Denies any falls prior to admission or post discharge.  Co-morbidities: H/o HTN, CVA, TIA, Hyperlipidemia, Palpitations, Mitral regurgitation,  Lumbar scoliosis,   Stroke:   Admission:  07/17/2016 - 07/19/2016  Acute CVA.  Patient states she has all her medications and no issues getting meds.  Patient confirms no medication issues.  Continues to wear 30 day heart monitor; no issues.   Encounter Medications:  Outpatient Encounter Prescriptions as of 08/22/2016  Medication Sig Note  . amLODipine (NORVASC) 5 MG tablet Take 2.5 mg by mouth 2 (two) times daily.    . ASCORBIC ACID PO Take 1 tablet by mouth daily.   Marland Kitchen aspirin 325 MG tablet Take 1 tablet (325 mg total) by mouth daily.   Marland Kitchen BIOTIN PO Take by mouth as directed. 07/17/2016: Pt endorses she does not take this frequently; just when she remembers it.   . Cholecalciferol (VITAMIN D3) 2000 UNITS TABS Take 1 tablet by mouth daily.   Marland Kitchen FLUZONE HIGH-DOSE 0.5 ML SUSY Inject as directed once. 07/17/2016: Up to date for 2017-2018 flu season.   . hydrochlorothiazide (HYDRODIURIL) 25 MG tablet Take 25 mg by mouth daily.   Marland Kitchen lidocaine (LIDODERM) 5 % Place 1  patch onto the skin as needed (for pain.). Remove & Discard patch within 12 hours or as directed by MD   . metoprolol succinate (TOPROL-XL) 50 MG 24 hr tablet Take 50 mg by mouth every evening. Take with or immediately following a meal.   . MISC NATURAL PRODUCTS PO Take by mouth. 07/17/2016: Pt states she uses other supplements occasionally, but cannot recall them at this time.   . Multiple Vitamin (MULTIVITAMIN) tablet Take 1 tablet by mouth daily.   . pantoprazole (PROTONIX) 40 MG tablet Take 40 mg by mouth daily.   . potassium chloride (KLOR-CON) 20 MEQ packet Take 20 mEq by mouth daily.   . pravastatin (PRAVACHOL) 20 MG tablet Take 1 tablet (20 mg total) by mouth daily at 6 PM.    No facility-administered encounter medications on file as of 08/22/2016.     Functional Status:  In your present state of health, do you have any difficulty performing the following activities: 07/29/2016 07/17/2016  Hearing? Tempie Donning  Vision? N N  Difficulty concentrating or making decisions? N Y  Walking or climbing stairs? N N  Dressing or bathing? N N  Doing errands, shopping? N N  Preparing Food and eating ? N -  Using the Toilet? N -  In the past six months, have you accidently leaked urine? N -  Do you have problems with loss of bowel control? N -  Managing your Medications? N -  Managing  your Finances? N -  Housekeeping or managing your Housekeeping? N -  Some recent data might be hidden    Fall/Depression Screening: PHQ 2/9 Scores 07/29/2016  PHQ - 2 Score 0   Plan: RN CM advised patient Goals of care met: case closed Case closure letter sent to patient.  [place] notified.  Nathaneil Canary, BSN, RN, [place] Care Management Care Management Coordinator [phone] Direct [phone] Cell [phone] Office [phone] Fax [email or web address]

## 2016-09-03 ENCOUNTER — Ambulatory Visit (INDEPENDENT_AMBULATORY_CARE_PROVIDER_SITE_OTHER): Payer: Medicare Other | Admitting: Neurology

## 2016-09-03 ENCOUNTER — Encounter: Payer: Self-pay | Admitting: Neurology

## 2016-09-03 VITALS — BP 119/65 | HR 68 | Ht 59.0 in | Wt 109.2 lb

## 2016-09-03 DIAGNOSIS — I6349 Cerebral infarction due to embolism of other cerebral artery: Secondary | ICD-10-CM | POA: Diagnosis not present

## 2016-09-03 NOTE — Progress Notes (Signed)
[facility]    Provider:  Dr [name] Referring Provider: Creola Corn, MD Primary Care Physician:  Gwen Pounds, MD  CC:  Stroke  HPI:  Melanie [name] is a 83 y.o. female here as a referral from Dr. Timothy Lasso for stroke follow up. She presented to [facility] 07/18/2016 for transient left-sided numbness and confusion. She walked into a grocery store, and then forgot what she was doing there or how to go about her regular activities. She did not have her cell phone, and went to call her daughter whom she calls every day(manually) but could not remember the number. She then was going to ask someone else to call her own number, but could not remember her own. She does not think that she had any difficulty with speech. She noticed that her left side was numb during this episode as well. The symptoms completely resolved, but she was very disconcerted within and therefore presented to the emergency room. For a brief second she could not remember her phone number. Numbers were rolling together. Denied any weakness, just a brief episode of confusion. 3 weeks earlier she was talking and just for a moment she couldn't think of what to say next, this happened twice. She couldn't talk. She was at [facility] and noticed some brief weakness on her left side. She couldn;t remember what she went to [facility] for, she couldn't remember a phone number to call. She was not on aspirin and now she is, no issues since being discharged. She feels fine, no weakness or difficulty speaking, she drives everything has been fine. She is taking ASA 325 without side effects or bleeding. She does feel palpitations sometimes and had these episodes on the heart monitor, she has not received results from her heart monitor.     Reviewed notes, labs and imaging from outside physicians, which showed  IMAGING I have personally reviewed the radiological images below and agree with the radiology interpretations.  Ct Head Code  Stroke W/o Cm 07/17/2016 1. Normal for age non contrast CT appearance of the brain. , stable since 2009. 2. ASPECTS is 10.   Mr Brain Wo Contrast Mr Maxine Glenn Head/brain Wo Cm 07/18/2016 1. Acute nonhemorrhagic white matter infarct measurement 8 mm adjacent to the atrium of the left lateral ventricle immediately posterior to the sylvian fissure.  2. No significant right-sided infarct.  3. Mild atrophy and white matter disease is likely within normal limits for age otherwise.  4. Unremarkable MRA circle of Willis without significant proximal stenosis, aneurysm, or branch vessel occlusion.   hgba1c 5.8, ldl 106   Review of Systems: Patient complains of symptoms per HPI as well as the following symptoms: no CP, no SOB. Pertinent negatives per HPI. All others negative.   Social History   Social History  . Marital status: Widowed    Spouse name: N/A  . Number of children: 2  . Years of education: Masters   Occupational History  . Retired    Social History Main Topics  . Smoking status: Never Smoker  . Smokeless tobacco: Never Used  . Alcohol use No  . Drug use: No  . Sexual activity: No   Other Topics Concern  . Not on file   Social History Narrative   Lives alone   Caffeine use: 1 cup coffee with breakfast     Family History  Problem Relation Age of Onset  . Rheum arthritis Mother   . Renal cancer Sister   . CAD Brother   .  Lung cancer Brother     Past Medical History:  Diagnosis Date  . Bruises easily    on Aspirin;on hold for surgery  . Carotid stenosis   . Chronic back pain    stenosis  . Chronic sinusitis   . Diverticulosis   . Esophageal stricture   . GERD (gastroesophageal reflux disease)    takes Protonix daily  . Heart murmur    as a child  . Hemorrhoids   . History of colon polyps   . Hypertension    takes Amlodipine,HCTZ,and Metoprolol  . Insomnia   . Peripheral edema   . Schatzki's ring   . Scoliosis     Past Surgical History:  Procedure  Laterality Date  . BREAST ENHANCEMENT SURGERY    . CHOLECYSTECTOMY    . COLONOSCOPY    . DILATION AND CURETTAGE OF UTERUS    . epidural injections    . ESOPHAGOGASTRODUODENOSCOPY    . EYE SURGERY     left cataract removed  . LUMBAR LAMINECTOMY/DECOMPRESSION MICRODISCECTOMY Left 01/26/2013   Procedure: LUMBAR LAMINECTOMY/DECOMPRESSION MICRODISCECTOMY 1 LEVEL;  Surgeon: Barnett AbuHenry [name], MD;  Location: [facility];  Service: Neurosurgery;  Laterality: Left;  Left Lumbar four-five Laminectomy/Foraminotomy  . steroid injections     x 3  . TOTAL HIP ARTHROPLASTY Right     Current Outpatient Prescriptions  Medication Sig Dispense Refill  . amLODipine (NORVASC) 5 MG tablet Take 2.5 mg by mouth 2 (two) times daily.     . ASCORBIC ACID PO Take 1 tablet by mouth daily.    Marland Kitchen. aspirin 325 MG tablet Take 1 tablet (325 mg total) by mouth daily.    Marland Kitchen. BIOTIN PO Take by mouth as directed.    . Cholecalciferol (VITAMIN D3) 2000 UNITS TABS Take 1 tablet by mouth daily.    Marland Kitchen. FLUZONE HIGH-DOSE 0.5 ML SUSY Inject as directed once.    . hydrochlorothiazide (HYDRODIURIL) 25 MG tablet Take 25 mg by mouth daily.    Marland Kitchen. lidocaine (LIDODERM) 5 % Place 1 patch onto the skin as needed (for pain.). Remove & Discard patch within 12 hours or as directed by MD    . metoprolol succinate (TOPROL-XL) 50 MG 24 hr tablet Take 50 mg by mouth every evening. Take with or immediately following a meal.    . MISC NATURAL PRODUCTS PO Take by mouth.    . Multiple Vitamin (MULTIVITAMIN) tablet Take 1 tablet by mouth daily.    . pantoprazole (PROTONIX) 40 MG tablet Take 40 mg by mouth daily.    . potassium chloride (KLOR-CON) 20 MEQ packet Take 20 mEq by mouth daily. 30 tablet 0  . pravastatin (PRAVACHOL) 20 MG tablet Take 1 tablet (20 mg total) by mouth daily at 6 PM. 30 tablet 0   No current facility-administered medications for this visit.     Allergies as of 09/03/2016  . (No Known Allergies)    Vitals: BP 119/65 (BP Location:  Left Arm, Patient Position: Sitting, Cuff Size: Normal)   Pulse 68   Ht 4\' 11"  (1.499 m)   Wt 109 lb 3.2 oz (49.5 kg)   BMI 22.06 kg/m  Last Weight:  Wt Readings from Last 1 Encounters:  09/03/16 109 lb 3.2 oz (49.5 kg)   Last Height:   Ht Readings from Last 1 Encounters:  09/03/16 4\' 11"  (1.499 m)   Physical exam: Exam: Gen: NAD, conversant  CV: RRR, no MRG. No Carotid Bruits. No peripheral edema, warm, nontender Eyes: Conjunctivae clear without exudates or hemorrhage  Neuro: Detailed Neurologic Exam  Speech:    Speech is normal; fluent and spontaneous with normal comprehension.  Cognition:    The patient is oriented to person, place, and time;     recent and remote memory intact;     language fluent;     normal attention, concentration,     fund of knowledge Cranial Nerves:    The pupils are equal, round, and reactive to light. The fundi are normal and spontaneous venous pulsations are present. Visual fields are full to finger confrontation. Extraocular movements are intact. Trigeminal sensation is intact and the muscles of mastication are normal. The face is symmetric. The palate elevates in the midline. Hearing intact. Voice is normal. Shoulder shrug is normal. The tongue has normal motion without fasciculations.   Coordination:    No dysmetria  Gait:    Good stride and turn, no imbalance  Motor Observation:    No asymmetry, no atrophy, and no involuntary movements noted. Tone:    Normal muscle tone.    Posture:    Posture is normal. normal erect    Strength:    Strength is V/V in the upper and lower limbs.      Sensation: intact to LT     Reflex Exam:  DTR's:    Deep tendon reflexes in the upper and lower extremities are normal bilaterally.   Toes:    The toes are downgoing bilaterally.   Clonus:    Clonus is absent.       Assessment/Plan:  Ms. Melanie [name] is a 83 y.o. female with history of HTN, mitral regurg, and mild  carotid artery disease presented with transient aphasia and L sided numbness. She did not receive IV t-PA due to resolved symptoms.   Stroke:  left lateral ventricle infarct. However, she symptoms more indicating right brain involvement. She also had episode of word finding difficulty 3 weeks ago. She has episodes of heart palpitation come on and off suddenly. Concerning for embolic secondary to cardiac source, especially afib  MRI  L lateral ventricle small infarct  MRA  Unremarkable   Carotid Doppler  01/2016 unremarkable  2D Echo  EF 60-65%  TEE - cancelled by Dr Rennis Golden due to history of Shatzki's ring and esophageal stricture.  EP recommends 30 day monitor instead of the loop implant. Patient wore the event monitor, no report seen will call and see if we can get it read.  LDL 106, started on cholesterol medication  HgbA1c 5.8  No antithrombotic prior to admission, now on aspirin 325 mg daily. Continue ASA 325mg  on discharge.  I had a long d/w patient about her recent stroke, risk for recurrent stroke/TIAs, personally independently reviewed imaging studies and stroke evaluation results and answered questions.Continue ASA 325mg  for secondary stroke prevention and maintain strict control of hypertension with blood pressure goal below 130/90, diabetes with hemoglobin A1c goal below 6.5% and lipids with LDL cholesterol goal below 70 mg/dL.  Patient can follow up with Darrol Angel in 6 months and then be released to her pcp if stable   Naomie Dean, MD  [facility] [address] [place], [place] [address]  Phone [phone] Fax [phone]

## 2016-09-03 NOTE — Patient Instructions (Signed)
Remember to drink plenty of fluid, eat healthy meals and do not skip any meals. Try to eat protein with a every meal and eat a healthy snack such as fruit or nuts in between meals. Try to keep a regular sleep-wake schedule and try to exercise daily, particularly in the form of walking, 20-30 minutes a day, if you can.   As far as your medications are concerned, I would like to suggest: continue current medications  I would like to see you back in 6 months, sooner if we need to. Please call us with any interim questions, concerns, problems, updates or refill requests.   Our phone number is [phone]. We also have an after hours call service for urgent matters and there is a physician on-call for urgent questions. For any emergencies you know to call 911 or go to the nearest emergency room   

## 2016-09-05 ENCOUNTER — Telehealth: Payer: Self-pay | Admitting: Neurology

## 2016-09-05 NOTE — Telephone Encounter (Signed)
Called and spoke to pt. Advised we are still trying to get report for heart monitor. Trying to get in contact with [name] at heart center. Will call her back once we know more.   She stated yesterday she experienced numbness in leg (worse than usual and arm/shoulder numb new (left side). It has improved today. She denies weakness, confusion, trouble breathing, speech problems, facial drooping, trouble walking, vision problems, slurred speech. She is also going to call Dr [name] and inform him of the incident. She states it started where she was having back pain and pain radiated up to her arm and down leg. She is feeling better today. Advised I will let Dr [name] know. Verbalized understanding.

## 2016-09-05 NOTE — Telephone Encounter (Signed)
Patient called to see if we have received results heart monitor she wore for 30 days. Placed 07/26/16-08/24/16.  Please call

## 2016-09-05 NOTE — Telephone Encounter (Signed)
Called and LVM for pt relaying information below. Advised we will call once we receive a report.

## 2016-09-05 NOTE — Telephone Encounter (Signed)
Messaged [name] at heart care center about patient's heart monitor results. She stated: "Patient wore the monitor untill 12-30. It then takes a few days for us to get the final summery. After we get this the End of Summery report we inport all the info into the patients chart for the doctor to be read. This report was imported in to the chart on 08-28-16. It is waiting on the doctors final sign off. We generally tell the patient it takes a 1-1.5 weeks after they take it off to get results.  The doctors nurse called him and hopefully he will sign off today".

## 2016-09-07 NOTE — Telephone Encounter (Signed)
Thanks, please monitor until report is complete

## 2016-09-09 ENCOUNTER — Telehealth: Payer: Self-pay

## 2016-09-09 NOTE — Telephone Encounter (Signed)
-----   Message from Jindong Xu, MD sent at 09/08/2016  6:34 PM EST ----- Could you please let the patient know that the 30 day cardiac monitoring test done recently showed on irregular heart beat. Please continue current treatment. Thanks.  Jindong Xu, MD PhD Stroke Neurology 09/08/2016 6:34 PM 

## 2016-09-09 NOTE — Telephone Encounter (Signed)
RN call patient about 30 day cardiac monitor showed no irregular heart beat. Continue current treatment plan. Pt verbalized understanding.

## 2016-09-09 NOTE — Telephone Encounter (Signed)
RN call patient about 30 day cardiac monitor showed no irregular heart beat. Continue current treatment plan. Pt verbalized understanding. 

## 2017-03-05 ENCOUNTER — Encounter (INDEPENDENT_AMBULATORY_CARE_PROVIDER_SITE_OTHER): Payer: Self-pay

## 2017-03-05 ENCOUNTER — Ambulatory Visit (INDEPENDENT_AMBULATORY_CARE_PROVIDER_SITE_OTHER): Payer: Medicare Other | Admitting: Physician Assistant

## 2017-03-05 VITALS — Ht 59.0 in | Wt 105.2 lb

## 2017-03-05 DIAGNOSIS — E784 Other hyperlipidemia: Secondary | ICD-10-CM

## 2017-03-05 DIAGNOSIS — I340 Nonrheumatic mitral (valve) insufficiency: Secondary | ICD-10-CM | POA: Diagnosis not present

## 2017-03-05 DIAGNOSIS — I10 Essential (primary) hypertension: Secondary | ICD-10-CM

## 2017-03-05 DIAGNOSIS — E7849 Other hyperlipidemia: Secondary | ICD-10-CM

## 2017-03-05 DIAGNOSIS — I350 Nonrheumatic aortic (valve) stenosis: Secondary | ICD-10-CM | POA: Diagnosis not present

## 2017-03-05 DIAGNOSIS — R002 Palpitations: Secondary | ICD-10-CM | POA: Diagnosis not present

## 2017-03-05 NOTE — Progress Notes (Signed)
Cardiology Office Note    Date:  03/08/2017   ID:  Melanie LaityGladys R [name], DOB 07/19/1924, MRN [id number]  PCP:  Creola Cornusso, John, MD  Cardiologist:  Dr. Elease HashimotoNahser Electrophysiologist: Dr. Elberta Fortisamnitz  Chief Complaint: Palpitations  History of Present Illness:   Melanie [name] is a 83 y.o. female with hx of HTN, HLD, stroke, Moderate AS, lumbar spinal stenosis and GERD presents for palpitations.   Carotid doppler 01/2013: less than 40% bilateral ICA stenosis.   Last echo 07/18/16 showed normal LV wall thickness with LVEF 60-65% and grade 1 diastolic dysfunction. Mitral annular calcification with mild mitral regurgitation. Moderate calcific aortic stenosis with gradient (S): 21 mm Hg. VTI ratio of LVOT to aortic valve: 0.45. Valve area (VTI): 1.14 cm^2. Valve area (Vmax): 1.15 cm^2.. Mild tricuspid regurgitation with PASP 28 mmHg. No obvious PFO or ASD.  Patient had stroke 06/2016. TEE did not performed due to prior hx of Shatzki's ring and esophageal stricture in 2008. Given hx of intermittent palpitation recommended 30 day monitor instead ILR. No arrhythmia or afib noted on monitor.  Labs from PCP 03/03/17 LDL 60 HDL 56 Trig 73 SCr 0.7 NA [id number] K 4.3 Albumin 3.6 AST 16 ALT 6 Hgb 13.4   Here today for follow up. Had one episode of "chest fluttering" at 5:30 am of 03/03/17 leading to evaluation by PCP as above labs. No associated symptoms. Self resolved in less than 5 minutes. The patient denies nausea, vomiting, fever, chest pain, shortness of breath, orthopnea, PND, dizziness, syncope, cough, congestion, abdominal pain, hematochezia, melena, lower extremity edema.   Past Medical History:  Diagnosis Date  . Bruises easily    on Aspirin;on hold for surgery  . Carotid stenosis   . Chronic back pain    stenosis  . Chronic sinusitis   . Diverticulosis   . Esophageal stricture   . GERD (gastroesophageal reflux disease)    takes Protonix daily  . Heart murmur    as a child  . Hemorrhoids   . History  of colon polyps   . Hypertension    takes Amlodipine,HCTZ,and Metoprolol  . Insomnia   . Peripheral edema   . Schatzki's ring   . Scoliosis     Past Surgical History:  Procedure Laterality Date  . BREAST ENHANCEMENT SURGERY    . CHOLECYSTECTOMY    . COLONOSCOPY    . DILATION AND CURETTAGE OF UTERUS    . epidural injections    . ESOPHAGOGASTRODUODENOSCOPY    . EYE SURGERY     left cataract removed  . LUMBAR LAMINECTOMY/DECOMPRESSION MICRODISCECTOMY Left 01/26/2013   Procedure: LUMBAR LAMINECTOMY/DECOMPRESSION MICRODISCECTOMY 1 LEVEL;  Surgeon: Barnett AbuHenry [name], MD;  Location: [facility];  Service: Neurosurgery;  Laterality: Left;  Left Lumbar four-five Laminectomy/Foraminotomy  . steroid injections     x 3  . TOTAL HIP ARTHROPLASTY Right     Current Medications: Prior to Admission medications   Medication Sig Start Date End Date Taking? Authorizing Provider  amLODipine (NORVASC) 5 MG tablet Take 2.5 mg by mouth 2 (two) times daily.     [provider]  ASCORBIC ACID PO Take 1 tablet by mouth daily.    [provider]  aspirin 325 MG tablet Take 1 tablet (325 mg total) by mouth daily. 07/20/16   Zannie CoveJoseph, Preetha, MD  BIOTIN PO Take by mouth as directed.    [provider]  Cholecalciferol (VITAMIN D3) 2000 UNITS TABS Take 1 tablet by mouth daily.    [provider]  FLUZONE HIGH-DOSE 0.5 ML SUSY Inject as directed once. 06/29/16   [provider]  hydrochlorothiazide (HYDRODIURIL) 25 MG tablet Take 25 mg by mouth daily.    [provider]  lidocaine (LIDODERM) 5 % Place 1 patch onto the skin as needed (for pain.). Remove & Discard patch within 12 hours or as directed by MD    [provider]  metoprolol succinate (TOPROL-XL) 50 MG 24 hr tablet Take 50 mg by mouth every evening. Take with or immediately following a meal.    [provider]  MISC NATURAL PRODUCTS PO Take by mouth.    [provider]    Multiple Vitamin (MULTIVITAMIN) tablet Take 1 tablet by mouth daily.    [provider]  pantoprazole (PROTONIX) 40 MG tablet Take 40 mg by mouth daily.    [provider]  potassium chloride (KLOR-CON) 20 MEQ packet Take 20 mEq by mouth daily. 07/19/16   Zannie Cove, MD  pravastatin (PRAVACHOL) 20 MG tablet Take 1 tablet (20 mg total) by mouth daily at 6 PM. 07/19/16   Zannie Cove, MD    Allergies:   Patient has no known allergies.   Social History   Social History  . Marital status: Widowed    Spouse name: N/A  . Number of children: 2  . Years of education: Masters   Occupational History  . Retired    Social History Main Topics  . Smoking status: Never Smoker  . Smokeless tobacco: Never Used  . Alcohol use No  . Drug use: No  . Sexual activity: No   Other Topics Concern  . Not on file   Social History Narrative   Lives alone   Caffeine use: 1 cup coffee with breakfast      Family History:  The patient's family history includes CAD in her brother; Lung cancer in her brother; Renal cancer in her sister; Rheum arthritis in her mother.   ROS:   Please see the history of present illness.    ROS All other systems reviewed and are negative.   PHYSICAL EXAM:   VS:  Ht 4\' 11"  (1.499 m)   Wt 105 lb 4 oz (47.7 kg)   BMI 21.26 kg/m   BP 120/80 GEN: Well nourished, well developed, in no acute distress  HEENT: normal  Neck: no JVD, carotid bruits, or masses Cardiac: RRR; no murmurs, rubs, or gallops,no edema  Respiratory:  clear to auscultation bilaterally, normal work of breathing GI: soft, nontender, nondistended, + BS MS: no deformity or atrophy  Skin: warm and dry, no rash Neuro:  Alert and Oriented x 3, Strength and sensation are intact Psych: euthymic mood, full affect  Wt Readings from Last 3 Encounters:  03/05/17 105 lb 4 oz (47.7 kg)  09/03/16 109 lb 3.2 oz (49.5 kg)  07/17/16 101 lb 3.1 oz (45.9 kg)      Studies/Labs Reviewed:    EKG:  EKG is ordered today.  The ekg ordered today demonstrates SR at rate of 62 bpm  Recent Labs: 07/17/2016: ALT 11; Hemoglobin 14.6; Magnesium 2.2; Platelets 246 07/18/2016: BUN 10; Creatinine, Ser 0.94; Potassium 3.2; Sodium 137   Lipid Panel    Component Value Date/Time   CHOL 181 07/18/2016 0253   TRIG 93 07/18/2016 0253   HDL 56 07/18/2016 0253   CHOLHDL 3.2 07/18/2016 0253   VLDL 19 07/18/2016 0253   LDLCALC 106 (H) 07/18/2016 0253    Additional studies/ records that were reviewed today  include:   Echocardiogram: 06/2016 Study Conclusions  - Left ventricle: The cavity size was normal. Wall thickness was   normal. Systolic function was normal. The estimated ejection   fraction was in the range of 60% to 65%. Wall motion was normal;   there were no regional wall motion abnormalities. Doppler   parameters are consistent with abnormal left ventricular   relaxation (grade 1 diastolic dysfunction). - Aortic valve: Trileaflet; moderately calcified leaflets. There   was moderate stenosis. Mean gradient (S): 11 mm Hg. Peak gradient   (S): 21 mm Hg. VTI ratio of LVOT to aortic valve: 0.45. Valve   area (VTI): 1.14 cm^2. Valve area (Vmax): 1.15 cm^2. - Mitral valve: Calcified annulus. There was mild regurgitation. - Right atrium: Central venous pressure (est): 3 mm Hg. - Atrial septum: No defect or patent foramen ovale was identified. - Tricuspid valve: There was mild regurgitation. - Pulmonary arteries: PA peak pressure: 28 mm Hg (S). - Pericardium, extracardiac: There was no pericardial effusion.  Impressions:  - Normal LV wall thickness with LVEF 60-65% and grade 1 diastolic   dysfunction. Mitral annular calcification with mild mitral   regurgitation. Moderate calcific aortic stenosis as outlined   above. Mild tricuspid regurgitation with PASP 28 mmHg. No obvious   PFO or ASD.  30 days monitor 06/2016 Sinus rhythm No arrhythmias No atrial  fibrillation    ASSESSMENT & PLAN:    1. Palpitations - One episode few day ago. No associated symptoms. Self resolved. Labs normal as described above. Will continues to observe for now. She will call us if worsen/recurrent symptoms. No interested monitor currently.   2. HLD - 07/18/2016: Cholesterol 181; HDL 56; LDL Cholesterol 106; Triglycerides 93; VLDL 19  - Continue statin. Per primary   3. HTN -stable and well controlled.    4. Moderate AS/mild MR - No dyspnea or syncope. Will follow up routine echo.   Unable to finish note at day of visit due to system was down. F/u with Dr. Elease Hashimoto in 6 months.    Medication Adjustments/Labs and Tests Ordered: Current medicines are reviewed at length with the patient today.  Concerns regarding medicines are outlined above.  Medication changes, Labs and Tests ordered today are listed in the Patient Instructions below. There are no Patient Instructions on file for this visit.   Lorelei Pont, [place]  03/08/2017 8:02 AM    [facility] [address] [place], [place], [place]  [address] Phone: [phone]; Fax: [phone]

## 2017-03-11 ENCOUNTER — Ambulatory Visit: Payer: Medicare Other | Admitting: Nurse Practitioner

## 2017-06-08 ENCOUNTER — Encounter (HOSPITAL_COMMUNITY): Payer: Self-pay | Admitting: Emergency Medicine

## 2017-06-08 ENCOUNTER — Emergency Department (HOSPITAL_COMMUNITY): Payer: Medicare Other

## 2017-06-08 ENCOUNTER — Emergency Department (HOSPITAL_COMMUNITY)
Admission: EM | Admit: 2017-06-08 | Discharge: 2017-06-09 | Disposition: A | Discharge status: Home / Self Care | Payer: Medicare Other | Attending: Emergency Medicine | Admitting: Emergency Medicine

## 2017-06-08 DIAGNOSIS — R509 Fever, unspecified: Secondary | ICD-10-CM | POA: Diagnosis present

## 2017-06-08 DIAGNOSIS — Z8673 Personal history of transient ischemic attack (TIA), and cerebral infarction without residual deficits: Secondary | ICD-10-CM | POA: Insufficient documentation

## 2017-06-08 DIAGNOSIS — Z79899 Other long term (current) drug therapy: Secondary | ICD-10-CM | POA: Diagnosis not present

## 2017-06-08 DIAGNOSIS — I110 Hypertensive heart disease with heart failure: Secondary | ICD-10-CM | POA: Diagnosis not present

## 2017-06-08 DIAGNOSIS — I509 Heart failure, unspecified: Secondary | ICD-10-CM | POA: Insufficient documentation

## 2017-06-08 DIAGNOSIS — Z7982 Long term (current) use of aspirin: Secondary | ICD-10-CM | POA: Insufficient documentation

## 2017-06-08 DIAGNOSIS — N3000 Acute cystitis without hematuria: Secondary | ICD-10-CM | POA: Diagnosis not present

## 2017-06-08 HISTORY — DX: Heart failure, unspecified: I50.9

## 2017-06-08 HISTORY — DX: Rheumatic disorders of both mitral and aortic valves: I08.0

## 2017-06-08 HISTORY — DX: Cerebral infarction, unspecified: I63.9

## 2017-06-08 HISTORY — DX: Palpitations: R00.2

## 2017-06-08 LAB — COMPREHENSIVE METABOLIC PANEL
ALT: 13 U/L — ABNORMAL LOW (ref 14–54)
ANION GAP: 11 (ref 5–15)
AST: 35 U/L (ref 15–41)
Albumin: 3.6 g/dL (ref 3.5–5.0)
Alkaline Phosphatase: 85 U/L (ref 38–126)
BUN: 9 mg/dL (ref 6–20)
CHLORIDE: 96 mmol/L — AB (ref 101–111)
CO2: 26 mmol/L (ref 22–32)
Calcium: 9.1 mg/dL (ref 8.9–10.3)
Creatinine, Ser: 0.74 mg/dL (ref 0.44–1.00)
Glucose, Bld: 114 mg/dL — ABNORMAL HIGH (ref 65–99)
POTASSIUM: 3.7 mmol/L (ref 3.5–5.1)
Sodium: 133 mmol/L — ABNORMAL LOW (ref 135–145)
TOTAL PROTEIN: 7 g/dL (ref 6.5–8.1)
Total Bilirubin: 1 mg/dL (ref 0.3–1.2)

## 2017-06-08 LAB — CBC WITH DIFFERENTIAL/PLATELET
BASOS ABS: 0 10*3/uL (ref 0.0–0.1)
Basophils Relative: 0 %
Eosinophils Absolute: 0.3 10*3/uL (ref 0.0–0.7)
Eosinophils Relative: 4 %
HEMATOCRIT: 39.2 % (ref 36.0–46.0)
Hemoglobin: 13.7 g/dL (ref 12.0–15.0)
LYMPHS PCT: 22 %
Lymphs Abs: 1.5 10*3/uL (ref 0.7–4.0)
MCH: 31.6 pg (ref 26.0–34.0)
MCHC: 34.9 g/dL (ref 30.0–36.0)
MCV: 90.3 fL (ref 78.0–100.0)
Monocytes Absolute: 0.9 10*3/uL (ref 0.1–1.0)
Monocytes Relative: 12 %
NEUTROS ABS: 4.3 10*3/uL (ref 1.7–7.7)
NEUTROS PCT: 62 %
Platelets: 202 10*3/uL (ref 150–400)
RBC: 4.34 MIL/uL (ref 3.87–5.11)
RDW: 13.3 % (ref 11.5–15.5)
WBC: 7.1 10*3/uL (ref 4.0–10.5)

## 2017-06-08 LAB — URINALYSIS, ROUTINE W REFLEX MICROSCOPIC
BILIRUBIN URINE: NEGATIVE
GLUCOSE, UA: NEGATIVE mg/dL
HGB URINE DIPSTICK: NEGATIVE
Ketones, ur: 20 mg/dL — AB
Leukocytes, UA: NEGATIVE
NITRITE: NEGATIVE
PH: 7 (ref 5.0–8.0)
Protein, ur: NEGATIVE mg/dL
SPECIFIC GRAVITY, URINE: 1.006 (ref 1.005–1.030)

## 2017-06-08 LAB — PROTIME-INR
INR: 0.86
Prothrombin Time: 11.7 seconds (ref 11.4–15.2)

## 2017-06-08 LAB — I-STAT CG4 LACTIC ACID, ED: LACTIC ACID, VENOUS: 1.28 mmol/L (ref 0.5–1.9)

## 2017-06-08 LAB — LIPASE, BLOOD: Lipase: 25 U/L (ref 11–51)

## 2017-06-08 LAB — I-STAT TROPONIN, ED: TROPONIN I, POC: 0.03 ng/mL (ref 0.00–0.08)

## 2017-06-08 MED ORDER — SODIUM CHLORIDE 0.9 % IV SOLN
INTRAVENOUS | Status: DC
  Administered 2017-06-08: 23:00:00 via INTRAVENOUS

## 2017-06-08 MED ORDER — IOPAMIDOL (ISOVUE-300) INJECTION 61%
30.0000 mL | Freq: Once | INTRAVENOUS | Status: AC | PRN
  Administered 2017-06-08: 30 mL via ORAL

## 2017-06-08 MED ORDER — IOPAMIDOL (ISOVUE-300) INJECTION 61%
INTRAVENOUS | Status: AC
  Administered 2017-06-08: 30 mL via ORAL
  Filled 2017-06-08: qty 30

## 2017-06-08 NOTE — ED Notes (Signed)
Bed: WU98 Expected date:  Expected time:  Means of arrival:  Comments: EMS 83 yo female UTI/sepsi

## 2017-06-08 NOTE — ED Provider Notes (Signed)
[facility]-EMERGENCY DEPT Provider Note   CSN: [id number] Arrival date & time: 06/08/17  2129     History   Chief Complaint Chief Complaint  Patient presents with  . Fever  . Urinary Tract Infection    HPI Melanie [name] is a 83 y.o. female.  HPI  Pt was seen at 2130. Per EMS and pt report, c/o gradual onset and persistence of intermittent home fevers for the past 2 days. Pt was evaluated by her PMD 2 days ago for these symptoms, dx UTI, rx cipro. Has been associated with multiple intermittent episodes of N/V and vague abd "pain." EMS noted temp "102.4," en route. Pt was given APAP, zofran, and IV NS . Pt denies diarrhea, no CP/palpitations, no SOB/cough, no abd pain, no back pain.   Past Medical History:  Diagnosis Date  . Bruises easily    on Aspirin;on hold for surgery  . Carotid stenosis   . CHF (congestive heart failure) (HCC)    diastolic dysfunction  . Chronic back pain    stenosis  . Chronic sinusitis   . Diverticulosis   . Esophageal stricture   . GERD (gastroesophageal reflux disease)    takes Protonix daily  . Heart murmur    as a child  . Hemorrhoids   . History of colon polyps   . Hypertension    takes Amlodipine,HCTZ,and Metoprolol  . Insomnia   . Mitral insufficiency and aortic stenosis   . Palpitations   . Peripheral edema   . Schatzki's ring   . Scoliosis   . Stroke [facility])     Patient Active Problem List   Diagnosis Date Noted  . Palpitations   . Acute CVA (cerebrovascular accident) (HCC) 07/18/2016  . TIA (transient ischemic attack)   . Hyperlipidemia   . Cerebrovascular accident (CVA) due to embolism of left middle cerebral artery (HCC)   . 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 Essential hypertension 07/17/2016  . Hypokalemia 07/17/2016  . Mitral regurgitation 02/24/2015  . Spinal stenosis, lumbar region, with neurogenic claudication 01/27/2013  . Lumbar scoliosis 01/27/2013    Past Surgical History:  Procedure  Laterality Date  . BREAST ENHANCEMENT SURGERY    . CHOLECYSTECTOMY    . COLONOSCOPY    . DILATION AND CURETTAGE OF UTERUS    . epidural injections    . ESOPHAGOGASTRODUODENOSCOPY    . EYE SURGERY     left cataract removed  . LUMBAR LAMINECTOMY/DECOMPRESSION MICRODISCECTOMY Left 01/26/2013   Procedure: LUMBAR LAMINECTOMY/DECOMPRESSION MICRODISCECTOMY 1 LEVEL;  Surgeon: Barnett Abu, MD;  Location: [facility];  Service: Neurosurgery;  Laterality: Left;  Left Lumbar four-five Laminectomy/Foraminotomy  . steroid injections     x 3  . TOTAL HIP ARTHROPLASTY Right     OB History    No data available       Home Medications    Prior to Admission medications   Medication Sig Start Date End Date Taking? Authorizing Provider  amLODipine (NORVASC) 5 MG tablet Take 2.5 mg by mouth 2 (two) times daily.     [provider]  ASCORBIC ACID PO Take 1 tablet by mouth daily.    [provider]  aspirin 325 MG tablet Take 1 tablet (325 mg total) by mouth daily. 07/20/16   Zannie Cove, MD  BIOTIN PO Take by mouth as directed.    [provider]  Cholecalciferol (VITAMIN D3) 2000 UNITS TABS Take 1 tablet by mouth daily.    [provider]  Georganna Skeans  0.5 ML SUSY Inject as directed once. 06/29/16   [provider]  hydrochlorothiazide (HYDRODIURIL) 25 MG tablet Take 25 mg by mouth daily.    [provider]  lidocaine (LIDODERM) 5 % Place 1 patch onto the skin as needed (for pain.). Remove & Discard patch within 12 hours or as directed by MD    [provider]  metoprolol succinate (TOPROL-XL) 50 MG 24 hr tablet Take 50 mg by mouth every evening. Take with or immediately following a meal.    [provider]  MISC NATURAL PRODUCTS PO Take by mouth.    [provider]  Multiple Vitamin (MULTIVITAMIN) tablet Take 1 tablet by mouth daily.    [provider]  pantoprazole (PROTONIX) 40 MG tablet Take 40 mg by  mouth daily.    [provider]  potassium chloride (KLOR-CON) 20 MEQ packet Take 20 mEq by mouth daily. 07/19/16   Zannie Cove, MD  pravastatin (PRAVACHOL) 20 MG tablet Take 1 tablet (20 mg total) by mouth daily at 6 PM. 07/19/16   Zannie Cove, MD    Family History Family History  Problem Relation Age of Onset  . Rheum arthritis Mother   . Renal cancer Sister   . CAD Brother   . Lung cancer Brother     Social History Social History  Substance Use Topics  . Smoking status: Never Smoker  . Smokeless tobacco: Never Used  . Alcohol use No     Allergies   Patient has no known allergies.   Review of Systems Review of Systems ROS: Statement: All systems negative except as marked or noted in the HPI; Constitutional: +fever and chills. ; ; Eyes: Negative for eye pain, redness and discharge. ; ; ENMT: Negative for ear pain, hoarseness, nasal congestion, sinus pressure and sore throat. ; ; Cardiovascular: Negative for chest pain, palpitations, diaphoresis, dyspnea and peripheral edema. ; ; Respiratory: Negative for cough, wheezing and stridor. ; ; Gastrointestinal: +N/V, abd pain. Negative for diarrhea, blood in stool, hematemesis, jaundice and rectal bleeding. . ; ; Genitourinary: Negative for dysuria, flank pain and hematuria. ; ; Musculoskeletal: Negative for back pain and neck pain. Negative for swelling and trauma.; ; Skin: Negative for pruritus, rash, abrasions, blisters, bruising and skin lesion.; ; Neuro: Negative for headache, lightheadedness and neck stiffness. Negative for weakness, altered level of consciousness, altered mental status, extremity weakness, paresthesias, involuntary movement, seizure and syncope.       Physical Exam Updated Vital Signs BP (!) 179/83 (BP Location: Right Arm)   Pulse 74   Temp 99 F (37.2 C) (Rectal)   Resp 18   SpO2 97%    Patient Vitals for the past 24 hrs:  BP Temp Temp src Pulse Resp SpO2  06/08/17 2145 (!) 179/83 99 F  (37.2 C) Rectal 74 18 97 %     Physical Exam 2135: Physical examination:  Nursing notes reviewed; Vital signs and O2 SAT reviewed;  Constitutional: Well developed, Well nourished, In no acute distress; Head:  Normocephalic, atraumatic; Eyes: EOMI, PERRL, No scleral icterus; ENMT: Mouth and pharynx normal, Mucous membranes dry; Neck: Supple, Full range of motion, No lymphadenopathy; Cardiovascular: Regular rate and rhythm, No gallop; Respiratory: Breath sounds clear & equal bilaterally, No wheezes.  Speaking full sentences with ease, Normal respiratory effort/excursion; Chest: Nontender, Movement normal; Abdomen: Soft, +mild diffuse tenderness to palp. No rebound or guarding. Nondistended, Normal bowel sounds; Genitourinary: No CVA tenderness; Extremities: Pulses normal, No tenderness, No edema, No calf edema  or asymmetry.; Neuro: AA&Ox3, +HOH, otherwise major CN grossly intact. No facial droop. Speech clear. Pt moves all extremities on stretcher spontaneously and to command without apparent gross focal motor or sensory deficits.; Skin: Color normal, Warm, Dry.   ED Treatments / Results  Labs (all labs ordered are listed, but only abnormal results are displayed)   EKG  EKG Interpretation  Date/Time:  Sunday June 08 2017 21:52:06 EDT Ventricular Rate:  78 PR Interval:    QRS Duration: 91 QT Interval:  434 QTC Calculation: 495 R Axis:   32 Text Interpretation:  Sinus rhythm Probable left atrial enlargement Borderline T abnormalities, anterior leads Borderline prolonged QT interval Baseline wander When compared with ECG of 07/17/2016 QT has lengthened Confirmed by Samuel Jester [phone]) on 06/08/2017 10:18:06 PM       Radiology   Procedures Procedures (including critical care time)  Medications Ordered in ED Medications  0.9 %  sodium chloride infusion (not administered)     Initial Impression / Assessment and Plan / ED Course  I have reviewed the triage vital signs and  the nursing notes.  Pertinent labs & imaging results that were available during my care of the patient were reviewed by me and considered in my medical decision making (see chart for details).  MDM Reviewed: previous chart, nursing note and vitals Reviewed previous: labs and ECG Interpretation: labs, ECG and x-ray   Results for orders placed or performed during the hospital encounter of 06/08/17  Comprehensive metabolic panel  Result Value Ref Range   Sodium 133 (L) 135 - 145 mmol/L   Potassium 3.7 3.5 - 5.1 mmol/L   Chloride 96 (L) 101 - 111 mmol/L   CO2 26 22 - 32 mmol/L   Glucose, Bld 114 (H) 65 - 99 mg/dL   BUN 9 6 - 20 mg/dL   Creatinine, Ser 6.04 0.44 - 1.00 mg/dL   Calcium 9.1 8.9 - 54.0 mg/dL   Total Protein 7.0 6.5 - 8.1 g/dL   Albumin 3.6 3.5 - 5.0 g/dL   AST 35 15 - 41 U/L   ALT 13 (L) 14 - 54 U/L   Alkaline Phosphatase 85 38 - 126 U/L   Total Bilirubin 1.0 0.3 - 1.2 mg/dL   GFR calc non Af Amer >60 >60 mL/min   GFR calc Af Amer >60 >60 mL/min   Anion gap 11 5 - 15  CBC with Differential  Result Value Ref Range   WBC 7.1 4.0 - 10.5 K/uL   RBC 4.34 3.87 - 5.11 MIL/uL   Hemoglobin 13.7 12.0 - 15.0 g/dL   HCT 98.1 19.1 - 47.8 %   MCV 90.3 78.0 - 100.0 fL   MCH 31.6 26.0 - 34.0 pg   MCHC 34.9 30.0 - 36.0 g/dL   RDW 29.5 62.1 - 30.8 %   Platelets 202 150 - 400 K/uL   Neutrophils Relative % 62 %   Neutro Abs 4.3 1.7 - 7.7 K/uL   Lymphocytes Relative 22 %   Lymphs Abs 1.5 0.7 - 4.0 K/uL   Monocytes Relative 12 %   Monocytes Absolute 0.9 0.1 - 1.0 K/uL   Eosinophils Relative 4 %   Eosinophils Absolute 0.3 0.0 - 0.7 K/uL   Basophils Relative 0 %   Basophils Absolute 0.0 0.0 - 0.1 K/uL  Protime-INR  Result Value Ref Range   Prothrombin Time 11.7 11.4 - 15.2 seconds   INR 0.86   Lipase, blood  Result Value Ref Range   Lipase 25  11 - 51 U/L  I-Stat CG4 Lactic Acid, ED  Result Value Ref Range   Lactic Acid, Venous 1.28 0.5 - 1.9 mmol/L  I-stat troponin, ED    Result Value Ref Range   Troponin i, poc 0.03 0.00 - 0.08 ng/mL   Comment 3           Dg Chest 2 View Result Date: 06/08/2017 CLINICAL DATA:  Altered mental status at 19:30 tonight. EXAM: CHEST  2 VIEW COMPARISON:  07/17/2016 FINDINGS: The lungs are clear. The pulmonary vasculature is normal. Heart size is normal. Hilar and mediastinal contours are unremarkable. There is no pleural effusion. IMPRESSION: No active cardiopulmonary disease. Electronically Signed   By: Ellery Plunk M.D.   On: 06/08/2017 22:38    2345:  UA/UC, CT A/P, orthostatic VS pending. No fever while in the ED. Workup completed so far is reassuring. Sign out to Dr. Judd Lien. Dispo based on results.     Final Clinical Impressions(s) / ED Diagnoses   Final diagnoses:  None    New Prescriptions New Prescriptions   No medications on file     Samuel Jester, DO 06/08/17 2349

## 2017-06-08 NOTE — ED Notes (Signed)
Pt in xray still

## 2017-06-08 NOTE — ED Triage Notes (Signed)
Pt comes from home c/o fever with hx of bladder infection. No dementia. Temp tympanic 102.4, bp 18084, hr70, 98 room air, cbg90.  20 in left arm, 500 ns in, 4 mg odt Zofran.  tylenol,  Bladder infection currently on cipro, n/v times two days

## 2017-06-09 ENCOUNTER — Emergency Department (HOSPITAL_COMMUNITY): Payer: Medicare Other

## 2017-06-09 ENCOUNTER — Encounter (HOSPITAL_COMMUNITY): Payer: Self-pay

## 2017-06-09 LAB — I-STAT CG4 LACTIC ACID, ED: Lactic Acid, Venous: 0.86 mmol/L (ref 0.5–1.9)

## 2017-06-09 MED ORDER — TRAMADOL HCL 50 MG PO TABS
50.0000 mg | ORAL_TABLET | Freq: Once | ORAL | Status: AC
  Administered 2017-06-09: 50 mg via ORAL
  Filled 2017-06-09: qty 1

## 2017-06-09 MED ORDER — NITROFURANTOIN MONOHYD MACRO 100 MG PO CAPS
100.0000 mg | ORAL_CAPSULE | Freq: Once | ORAL | Status: AC
  Administered 2017-06-09: 100 mg via ORAL
  Filled 2017-06-09: qty 1

## 2017-06-09 MED ORDER — IOPAMIDOL (ISOVUE-300) INJECTION 61%
100.0000 mL | Freq: Once | INTRAVENOUS | Status: AC | PRN
  Administered 2017-06-09: 80 mL via INTRAVENOUS

## 2017-06-09 MED ORDER — IOPAMIDOL (ISOVUE-300) INJECTION 61%
INTRAVENOUS | Status: AC
Start: 2017-06-09 — End: 2017-06-09
  Administered 2017-06-09: 80 mL via INTRAVENOUS
  Filled 2017-06-09: qty 100

## 2017-06-09 MED ORDER — NITROFURANTOIN MONOHYD MACRO 100 MG PO CAPS: 100.0000 mg | ORAL_CAPSULE | Freq: Two times a day (BID) | ORAL | 0 refills | Status: DC

## 2017-06-09 NOTE — Discharge Instructions (Signed)
Macrobid as prescribed.  Stop taking Cipro.  Return to the emergency department if you develop a worsening of symptoms, or other new and concerning symptoms.

## 2017-06-09 NOTE — ED Notes (Signed)
Patient transported to X-ray 

## 2017-06-09 NOTE — ED Provider Notes (Signed)
Care assumed from Dr. Clarene Duke at shift change. Patient awaiting a ct scan to rule out intra-abdominal pathology. This was performed and was negative. She appears clinically well, non-toxic.   Patient tells me she began feeling worse when she started taking Cipro and is concerned her symptoms are a side effect of this. She will be changed to Macrobid. To return as needed for any problems.   Geoffery Lyons, MD 06/09/17 [phone]

## 2017-06-10 LAB — URINE CULTURE: Culture: NO GROWTH

## 2017-06-14 LAB — CULTURE, BLOOD (ROUTINE X 2)
CULTURE: NO GROWTH
SPECIAL REQUESTS: ADEQUATE

## 2017-06-27 ENCOUNTER — Observation Stay (HOSPITAL_COMMUNITY): Payer: Medicare Other

## 2017-06-27 ENCOUNTER — Encounter (HOSPITAL_COMMUNITY): Payer: Self-pay | Admitting: Nurse Practitioner

## 2017-06-27 ENCOUNTER — Emergency Department (HOSPITAL_COMMUNITY): Payer: Medicare Other

## 2017-06-27 ENCOUNTER — Observation Stay (HOSPITAL_COMMUNITY)
Admission: EM | Admit: 2017-06-27 | Discharge: 2017-06-28 | Disposition: A | Discharge status: Home / Self Care | Payer: Medicare Other | Attending: Family Medicine | Admitting: Family Medicine

## 2017-06-27 DIAGNOSIS — Z8261 Family history of arthritis: Secondary | ICD-10-CM | POA: Insufficient documentation

## 2017-06-27 DIAGNOSIS — I700 Atherosclerosis of aorta: Secondary | ICD-10-CM | POA: Insufficient documentation

## 2017-06-27 DIAGNOSIS — G8929 Other chronic pain: Secondary | ICD-10-CM | POA: Insufficient documentation

## 2017-06-27 DIAGNOSIS — K449 Diaphragmatic hernia without obstruction or gangrene: Secondary | ICD-10-CM | POA: Insufficient documentation

## 2017-06-27 DIAGNOSIS — R002 Palpitations: Secondary | ICD-10-CM | POA: Diagnosis not present

## 2017-06-27 DIAGNOSIS — Z801 Family history of malignant neoplasm of trachea, bronchus and lung: Secondary | ICD-10-CM | POA: Insufficient documentation

## 2017-06-27 DIAGNOSIS — G4700 Insomnia, unspecified: Secondary | ICD-10-CM | POA: Diagnosis not present

## 2017-06-27 DIAGNOSIS — Z9114 Patient's other noncompliance with medication regimen: Secondary | ICD-10-CM | POA: Diagnosis not present

## 2017-06-27 DIAGNOSIS — M549 Dorsalgia, unspecified: Secondary | ICD-10-CM | POA: Diagnosis not present

## 2017-06-27 DIAGNOSIS — J329 Chronic sinusitis, unspecified: Secondary | ICD-10-CM | POA: Diagnosis not present

## 2017-06-27 DIAGNOSIS — Z86010 Personal history of colonic polyps: Secondary | ICD-10-CM | POA: Insufficient documentation

## 2017-06-27 DIAGNOSIS — Z9882 Breast implant status: Secondary | ICD-10-CM | POA: Insufficient documentation

## 2017-06-27 DIAGNOSIS — K222 Esophageal obstruction: Secondary | ICD-10-CM | POA: Insufficient documentation

## 2017-06-27 DIAGNOSIS — I110 Hypertensive heart disease with heart failure: Secondary | ICD-10-CM | POA: Diagnosis not present

## 2017-06-27 DIAGNOSIS — Z96641 Presence of right artificial hip joint: Secondary | ICD-10-CM | POA: Insufficient documentation

## 2017-06-27 DIAGNOSIS — K219 Gastro-esophageal reflux disease without esophagitis: Secondary | ICD-10-CM | POA: Diagnosis not present

## 2017-06-27 DIAGNOSIS — E785 Hyperlipidemia, unspecified: Secondary | ICD-10-CM | POA: Diagnosis not present

## 2017-06-27 DIAGNOSIS — Z7982 Long term (current) use of aspirin: Secondary | ICD-10-CM | POA: Diagnosis not present

## 2017-06-27 DIAGNOSIS — I080 Rheumatic disorders of both mitral and aortic valves: Secondary | ICD-10-CM | POA: Diagnosis not present

## 2017-06-27 DIAGNOSIS — K5790 Diverticulosis of intestine, part unspecified, without perforation or abscess without bleeding: Secondary | ICD-10-CM | POA: Diagnosis not present

## 2017-06-27 DIAGNOSIS — M419 Scoliosis, unspecified: Secondary | ICD-10-CM | POA: Insufficient documentation

## 2017-06-27 DIAGNOSIS — Z881 Allergy status to other antibiotic agents status: Secondary | ICD-10-CM | POA: Insufficient documentation

## 2017-06-27 DIAGNOSIS — Z9049 Acquired absence of other specified parts of digestive tract: Secondary | ICD-10-CM | POA: Insufficient documentation

## 2017-06-27 DIAGNOSIS — Z8051 Family history of malignant neoplasm of kidney: Secondary | ICD-10-CM | POA: Insufficient documentation

## 2017-06-27 DIAGNOSIS — Z8673 Personal history of transient ischemic attack (TIA), and cerebral infarction without residual deficits: Secondary | ICD-10-CM | POA: Insufficient documentation

## 2017-06-27 DIAGNOSIS — R112 Nausea with vomiting, unspecified: Secondary | ICD-10-CM | POA: Insufficient documentation

## 2017-06-27 DIAGNOSIS — M5136 Other intervertebral disc degeneration, lumbar region: Secondary | ICD-10-CM | POA: Insufficient documentation

## 2017-06-27 DIAGNOSIS — Z8249 Family history of ischemic heart disease and other diseases of the circulatory system: Secondary | ICD-10-CM | POA: Insufficient documentation

## 2017-06-27 DIAGNOSIS — K649 Unspecified hemorrhoids: Secondary | ICD-10-CM | POA: Diagnosis not present

## 2017-06-27 DIAGNOSIS — K5730 Diverticulosis of large intestine without perforation or abscess without bleeding: Secondary | ICD-10-CM | POA: Insufficient documentation

## 2017-06-27 DIAGNOSIS — G451 Carotid artery syndrome (hemispheric): Secondary | ICD-10-CM | POA: Insufficient documentation

## 2017-06-27 DIAGNOSIS — Z79899 Other long term (current) drug therapy: Secondary | ICD-10-CM | POA: Insufficient documentation

## 2017-06-27 DIAGNOSIS — E876 Hypokalemia: Secondary | ICD-10-CM | POA: Diagnosis not present

## 2017-06-27 LAB — CBC WITH DIFFERENTIAL/PLATELET
Basophils Absolute: 0 10*3/uL (ref 0.0–0.1)
Basophils Relative: 1 %
EOS ABS: 0.3 10*3/uL (ref 0.0–0.7)
EOS PCT: 4 %
HCT: 40.7 % (ref 36.0–46.0)
HEMOGLOBIN: 14.3 g/dL (ref 12.0–15.0)
LYMPHS ABS: 1.7 10*3/uL (ref 0.7–4.0)
LYMPHS PCT: 22 %
MCH: 31.6 pg (ref 26.0–34.0)
MCHC: 35.1 g/dL (ref 30.0–36.0)
MCV: 89.8 fL (ref 78.0–100.0)
MONOS PCT: 5 %
Monocytes Absolute: 0.4 10*3/uL (ref 0.1–1.0)
NEUTROS PCT: 69 %
Neutro Abs: 5.3 10*3/uL (ref 1.7–7.7)
Platelets: 281 10*3/uL (ref 150–400)
RBC: 4.53 MIL/uL (ref 3.87–5.11)
RDW: 13.5 % (ref 11.5–15.5)
WBC: 7.7 10*3/uL (ref 4.0–10.5)

## 2017-06-27 LAB — COMPREHENSIVE METABOLIC PANEL
ALK PHOS: 84 U/L (ref 38–126)
ALT: 11 U/L — ABNORMAL LOW (ref 14–54)
ANION GAP: 15 (ref 5–15)
AST: 25 U/L (ref 15–41)
Albumin: 3.8 g/dL (ref 3.5–5.0)
BILIRUBIN TOTAL: 0.8 mg/dL (ref 0.3–1.2)
BUN: 13 mg/dL (ref 6–20)
CALCIUM: 9.7 mg/dL (ref 8.9–10.3)
CO2: 22 mmol/L (ref 22–32)
CREATININE: 0.83 mg/dL (ref 0.44–1.00)
Chloride: 101 mmol/L (ref 101–111)
Glucose, Bld: 187 mg/dL — ABNORMAL HIGH (ref 65–99)
Potassium: 3 mmol/L — ABNORMAL LOW (ref 3.5–5.1)
SODIUM: 138 mmol/L (ref 135–145)
TOTAL PROTEIN: 7 g/dL (ref 6.5–8.1)

## 2017-06-27 LAB — TROPONIN I

## 2017-06-27 LAB — URINALYSIS, ROUTINE W REFLEX MICROSCOPIC
BILIRUBIN URINE: NEGATIVE
Glucose, UA: NEGATIVE mg/dL
HGB URINE DIPSTICK: NEGATIVE
KETONES UR: 5 mg/dL — AB
Leukocytes, UA: NEGATIVE
NITRITE: NEGATIVE
PROTEIN: NEGATIVE mg/dL
Specific Gravity, Urine: 1.006 (ref 1.005–1.030)
pH: 8 (ref 5.0–8.0)

## 2017-06-27 LAB — I-STAT CG4 LACTIC ACID, ED: Lactic Acid, Venous: 1.43 mmol/L (ref 0.5–1.9)

## 2017-06-27 LAB — INFLUENZA PANEL BY PCR (TYPE A & B)
INFLAPCR: NEGATIVE
INFLBPCR: NEGATIVE

## 2017-06-27 LAB — LIPASE, BLOOD: LIPASE: 24 U/L (ref 11–51)

## 2017-06-27 MED ORDER — PANTOPRAZOLE SODIUM 40 MG PO TBEC
40.0000 mg | DELAYED_RELEASE_TABLET | Freq: Every day | ORAL | Status: DC
  Administered 2017-06-28: 40 mg via ORAL
  Filled 2017-06-27: qty 1

## 2017-06-27 MED ORDER — METOPROLOL TARTRATE 50 MG PO TABS
50.0000 mg | ORAL_TABLET | Freq: Every day | ORAL | Status: DC
  Administered 2017-06-27: 50 mg via ORAL
  Filled 2017-06-27: qty 1

## 2017-06-27 MED ORDER — VITAMIN D 1000 UNITS PO TABS
2000.0000 [IU] | ORAL_TABLET | Freq: Every day | ORAL | Status: DC
  Administered 2017-06-28: 2000 [IU] via ORAL
  Filled 2017-06-27: qty 2

## 2017-06-27 MED ORDER — HYDROCHLOROTHIAZIDE 25 MG PO TABS
25.0000 mg | ORAL_TABLET | Freq: Every day | ORAL | Status: DC
  Administered 2017-06-28: 25 mg via ORAL
  Filled 2017-06-27: qty 1

## 2017-06-27 MED ORDER — SODIUM CHLORIDE 0.9 % IV SOLN: 250.0000 mL | INTRAVENOUS | Status: DC | PRN

## 2017-06-27 MED ORDER — ADULT MULTIVITAMIN W/MINERALS CH
1.0000 | ORAL_TABLET | Freq: Every day | ORAL | Status: DC
  Administered 2017-06-28: 1 via ORAL
  Filled 2017-06-27: qty 1

## 2017-06-27 MED ORDER — IOPAMIDOL (ISOVUE-300) INJECTION 61%
INTRAVENOUS | Status: AC
  Administered 2017-06-27: 100 mL via INTRAVENOUS
  Filled 2017-06-27: qty 100

## 2017-06-27 MED ORDER — SODIUM CHLORIDE 0.9 % IV SOLN
INTRAVENOUS | Status: DC
  Administered 2017-06-27: 125 mL/h via INTRAVENOUS
  Administered 2017-06-27 – 2017-06-28 (×2): via INTRAVENOUS

## 2017-06-27 MED ORDER — IOPAMIDOL (ISOVUE-300) INJECTION 61%
INTRAVENOUS | Status: AC
  Administered 2017-06-27: 19:00:00
  Filled 2017-06-27: qty 100

## 2017-06-27 MED ORDER — ONDANSETRON HCL 4 MG/2ML IJ SOLN
4.0000 mg | Freq: Once | INTRAMUSCULAR | Status: AC
  Administered 2017-06-27: 4 mg via INTRAVENOUS
  Filled 2017-06-27: qty 2

## 2017-06-27 MED ORDER — ENSURE ENLIVE PO LIQD
237.0000 mL | Freq: Two times a day (BID) | ORAL | Status: DC
  Administered 2017-06-28 (×2): 237 mL via ORAL

## 2017-06-27 MED ORDER — ACETAMINOPHEN 650 MG RE SUPP
650.0000 mg | Freq: Four times a day (QID) | RECTAL | Status: DC | PRN
Start: 2017-06-27 — End: 2017-06-28

## 2017-06-27 MED ORDER — ASPIRIN 325 MG PO TABS
325.0000 mg | ORAL_TABLET | Freq: Every day | ORAL | Status: DC
  Administered 2017-06-27 – 2017-06-28 (×2): 325 mg via ORAL
  Filled 2017-06-27 (×2): qty 1

## 2017-06-27 MED ORDER — SODIUM CHLORIDE 0.9 % IV BOLUS (SEPSIS)
500.0000 mL | Freq: Once | INTRAVENOUS | Status: AC
  Administered 2017-06-27: 500 mL via INTRAVENOUS

## 2017-06-27 MED ORDER — ACETAMINOPHEN 325 MG PO TABS: 650.0000 mg | ORAL_TABLET | Freq: Four times a day (QID) | ORAL | Status: DC | PRN

## 2017-06-27 MED ORDER — ONDANSETRON HCL 4 MG/2ML IJ SOLN: 4.0000 mg | Freq: Four times a day (QID) | INTRAMUSCULAR | Status: DC | PRN

## 2017-06-27 MED ORDER — SODIUM CHLORIDE 0.9% FLUSH: 3.0000 mL | INTRAVENOUS | Status: DC | PRN

## 2017-06-27 MED ORDER — AMLODIPINE BESYLATE 5 MG PO TABS
2.5000 mg | ORAL_TABLET | Freq: Two times a day (BID) | ORAL | Status: DC
Start: 2017-06-27 — End: 2017-06-28
  Administered 2017-06-27 – 2017-06-28 (×2): 2.5 mg via ORAL
  Filled 2017-06-27 (×2): qty 1

## 2017-06-27 MED ORDER — POTASSIUM CHLORIDE CRYS ER 20 MEQ PO TBCR
20.0000 meq | EXTENDED_RELEASE_TABLET | Freq: Every day | ORAL | Status: DC
  Filled 2017-06-27: qty 1

## 2017-06-27 MED ORDER — POTASSIUM CHLORIDE CRYS ER 20 MEQ PO TBCR
40.0000 meq | EXTENDED_RELEASE_TABLET | Freq: Once | ORAL | Status: AC
  Administered 2017-06-27: 40 meq via ORAL
  Filled 2017-06-27: qty 2

## 2017-06-27 MED ORDER — ENOXAPARIN SODIUM 30 MG/0.3ML ~~LOC~~ SOLN
30.0000 mg | SUBCUTANEOUS | Status: DC
  Administered 2017-06-27: 30 mg via SUBCUTANEOUS
  Filled 2017-06-27: qty 0.3

## 2017-06-27 MED ORDER — POTASSIUM CHLORIDE 20 MEQ PO PACK
20.0000 meq | PACK | Freq: Every day | ORAL | Status: DC
  Filled 2017-06-27: qty 1

## 2017-06-27 MED ORDER — SODIUM CHLORIDE 0.9 % IV BOLUS (SEPSIS)
1000.0000 mL | Freq: Once | INTRAVENOUS | Status: AC
  Administered 2017-06-27: 1000 mL via INTRAVENOUS

## 2017-06-27 MED ORDER — SODIUM CHLORIDE 0.9% FLUSH
3.0000 mL | Freq: Two times a day (BID) | INTRAVENOUS | Status: DC
  Administered 2017-06-27: 3 mL via INTRAVENOUS

## 2017-06-27 MED ORDER — PRAVASTATIN SODIUM 20 MG PO TABS
20.0000 mg | ORAL_TABLET | Freq: Every day | ORAL | Status: DC
  Administered 2017-06-28: 20 mg via ORAL
  Filled 2017-06-27: qty 1

## 2017-06-27 NOTE — ED Provider Notes (Signed)
83 yo F with multiple complaints.  Received in signout from Dr. Freida BusmanAllen.  Briefly patient has had tingling from her neck down her body.  Has just been feeling generally unwell.  Thought to maybe be influenza but tested negative.  Having some abdominal cramping and so obtaining a CT scan.  If negative and patient feeling better plan is to discharge home.  The patient states that she still feels terrible.  On my brief history the patient states that she has been having nausea vomiting and diarrhea since this morning.  And also having a mild cough, rhinorrhea sinus congestion.  She is having some paresthesias which she has had chronically from her chronic neck and lower back issues.  She denies any abdominal pain.  When I discussed the results with her she is still concerned that she does not feel well she is at home alone and would like me to discuss case with the hospitalist about possible admission.  The patients results and plan were reviewed and discussed.   Any x-rays performed were independently reviewed by myself.   Differential diagnosis were considered with the presenting HPI.  Medications  0.9 %  sodium chloride infusion ( Intravenous New Bag/Given 06/27/17 2122)  metoprolol tartrate (LOPRESSOR) tablet 50 mg (50 mg Oral Given 06/27/17 2223)  aspirin tablet 325 mg (325 mg Oral Given 06/27/17 2223)  pravastatin (PRAVACHOL) tablet 20 mg (not administered)  amLODipine (NORVASC) tablet 2.5 mg (2.5 mg Oral Given 06/27/17 2223)  cholecalciferol (VITAMIN D) tablet 2,000 Units (not administered)  hydrochlorothiazide (HYDRODIURIL) tablet 25 mg (not administered)  multivitamin with minerals tablet 1 tablet (not administered)  pantoprazole (PROTONIX) EC tablet 40 mg (not administered)  enoxaparin (LOVENOX) injection 30 mg (30 mg Subcutaneous Given 06/27/17 2223)  sodium chloride flush (NS) 0.9 % injection 3 mL (3 mLs Intravenous Given 06/27/17 2200)  sodium chloride flush (NS) 0.9 % injection 3 mL (not  administered)  0.9 %  sodium chloride infusion (not administered)  acetaminophen (TYLENOL) tablet 650 mg (not administered)    Or  acetaminophen (TYLENOL) suppository 650 mg (not administered)  ondansetron (ZOFRAN) injection 4 mg (not administered)  potassium chloride SA (K-DUR,KLOR-CON) CR tablet 20 mEq (not administered)  sodium chloride 0.9 % bolus 1,000 mL (0 mLs Intravenous Stopped 06/27/17 1031)  ondansetron (ZOFRAN) injection 4 mg (4 mg Intravenous Given 06/27/17 0951)  potassium chloride SA (K-DUR,KLOR-CON) CR tablet 40 mEq (40 mEq Oral Given 06/27/17 1424)  sodium chloride 0.9 % bolus 500 mL (0 mLs Intravenous Stopped 06/27/17 1801)  iopamidol (ISOVUE-300) 61 % injection (  Contrast Given 06/27/17 1834)  iopamidol (ISOVUE-300) 61 % injection (100 mLs Intravenous Contrast Given 06/27/17 1817)  ondansetron (ZOFRAN) injection 4 mg (4 mg Intravenous Given 06/27/17 2030)    Vitals:   06/27/17 1445 06/27/17 1500 06/27/17 1544 06/27/17 2103  BP:  (!) 144/100 (!) 157/68 (!) 167/74  Pulse: 66 74 74 70  Resp:   14 18  Temp:    97.9 F (36.6 C)  TempSrc:    Oral  SpO2: 98% 95% 96% 99%  Weight:      Height:        Final diagnoses:  Nausea & vomiting    Admission/ observation were discussed with the admitting physician, patient and/or family and they are comfortable with the plan.     Melene PlanFloyd, Aveline Daus, DO 06/27/17 2232

## 2017-06-27 NOTE — ED Notes (Signed)
Post Void Residual: 124mL Dr Adela LankFloyd recommends DC order for foley and continue with discharge as planned with Dr Freida BusmanAllen

## 2017-06-27 NOTE — ED Notes (Signed)
Bed: WA07 Expected date:  Expected time:  Means of arrival:  Comments: 83 yo f numbness/tingling left side

## 2017-06-27 NOTE — ED Provider Notes (Signed)
[facility]-EMERGENCY DEPT Provider Note   CSN: [id number] Arrival date & time: 06/27/17  0802     History   Chief Complaint No chief complaint on file.   HPI Melanie [name] is a 83 y.o. female.  83 year old female presents with acute onset of paresthesias in her upper and lower extremities just prior to arrival.  Patient states she was laying in bed when symptoms started.  Has had no associated headache but has had copious amounts of emesis without associated abdominal discomfort or fever or chills.  States that the emesis become so severe that she has had some diarrhea.  The paresthesias have been persistent in her upper and lower extreme ease and nothing makes them better or worse.  Denies any visual changes.  Did have a flu shot several days ago.  Denies any change to her speech or confusion.  Nothing makes her symptoms better no treatment used prior to arrival      Past Medical History:  Diagnosis Date  . Bruises easily    on Aspirin;on hold for surgery  . Carotid stenosis   . CHF (congestive heart failure) (HCC)    diastolic dysfunction  . Chronic back pain    stenosis  . Chronic sinusitis   . Diverticulosis   . Esophageal stricture   . GERD (gastroesophageal reflux disease)    takes Protonix daily  . Heart murmur    as a child  . Hemorrhoids   . History of colon polyps   . Hypertension    takes Amlodipine,HCTZ,and Metoprolol  . Insomnia   . Mitral insufficiency and aortic stenosis   . Palpitations   . Peripheral edema   . Schatzki's ring   . Scoliosis   . Stroke [facility])     Patient Active Problem List   Diagnosis Date Noted  . Palpitations   . Acute CVA (cerebrovascular accident) (HCC) 07/18/2016  . TIA (transient ischemic attack)   . Hyperlipidemia   . Cerebrovascular accident (CVA) due to embolism of left middle cerebral artery (HCC)   . 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 Essential hypertension 07/17/2016  .  Hypokalemia 07/17/2016  . Mitral regurgitation 02/24/2015  . Spinal stenosis, lumbar region, with neurogenic claudication 01/27/2013  . Lumbar scoliosis 01/27/2013    Past Surgical History:  Procedure Laterality Date  . BREAST ENHANCEMENT SURGERY    . CHOLECYSTECTOMY    . COLONOSCOPY    . DILATION AND CURETTAGE OF UTERUS    . epidural injections    . ESOPHAGOGASTRODUODENOSCOPY    . EYE SURGERY     left cataract removed  . LUMBAR LAMINECTOMY/DECOMPRESSION MICRODISCECTOMY Left 01/26/2013   Procedure: LUMBAR LAMINECTOMY/DECOMPRESSION MICRODISCECTOMY 1 LEVEL;  Surgeon: Barnett Abu, MD;  Location: [facility];  Service: Neurosurgery;  Laterality: Left;  Left Lumbar four-five Laminectomy/Foraminotomy  . steroid injections     x 3  . TOTAL HIP ARTHROPLASTY Right     OB History    No data available       Home Medications    Prior to Admission medications   Medication Sig Start Date End Date Taking? Authorizing Provider  amLODipine (NORVASC) 5 MG tablet Take 2.5 mg by mouth 2 (two) times daily.     [provider]  ASCORBIC ACID PO Take 1 tablet by mouth daily.    [provider]  aspirin 325 MG tablet Take 1 tablet (325 mg total) by mouth daily. 07/20/16   Zannie Cove, MD  BIOTIN PO Take  by mouth as directed.    [provider]  celecoxib (CELEBREX) 200 MG capsule Take 200 mg by mouth daily as needed for mild pain or moderate pain.  05/30/17   [provider]  Cholecalciferol (VITAMIN D3) 2000 UNITS TABS Take 1 tablet by mouth daily.    [provider]  hydrochlorothiazide (HYDRODIURIL) 25 MG tablet Take 25 mg by mouth daily after breakfast.     [provider]  metoprolol tartrate (LOPRESSOR) 50 MG tablet Take 50 mg by mouth daily after breakfast.  04/12/17   [provider]  Multiple Vitamin (MULTIVITAMIN) tablet Take 1 tablet by mouth daily.    [provider]  nitrofurantoin, macrocrystal-monohydrate,  (MACROBID) 100 MG capsule Take 1 capsule (100 mg total) by mouth 2 (two) times daily. X 7 days 06/09/17   Geoffery Lyons, MD  pantoprazole (PROTONIX) 40 MG tablet Take 40 mg by mouth daily after breakfast.     [provider]  potassium chloride (KLOR-CON) 20 MEQ packet Take 20 mEq by mouth daily. 07/19/16   Zannie Cove, MD  pravastatin (PRAVACHOL) 20 MG tablet Take 1 tablet (20 mg total) by mouth daily at 6 PM. Patient taking differently: Take 20 mg by mouth daily after breakfast.  07/19/16   Zannie Cove, MD    Family History Family History  Problem Relation Age of Onset  . Rheum arthritis Mother   . Renal cancer Sister   . CAD Brother   . Lung cancer Brother     Social History Social History  Substance Use Topics  . Smoking status: Never Smoker  . Smokeless tobacco: Never Used  . Alcohol use No     Allergies   Ciprofloxacin   Review of Systems Review of Systems  All other systems reviewed and are negative.    Physical Exam Updated Vital Signs BP (!) 154/77   Pulse 60   Temp 98.2 F (36.8 C)   Resp 13   Ht 1.524 m (5')   Wt 49 kg (108 lb)   SpO2 92%   BMI 21.09 kg/m   Physical Exam  Constitutional: She is oriented to person, place, and time. She appears well-developed and well-nourished.  Non-toxic appearance. No distress.  HENT:  Head: Normocephalic and atraumatic.  Eyes: Pupils are equal, round, and reactive to light. Conjunctivae, EOM and lids are normal.  Neck: Normal range of motion. Neck supple. No tracheal deviation present. No thyroid mass present.  Cardiovascular: Normal rate, regular rhythm and normal heart sounds.  Exam reveals no gallop.   No murmur heard. Pulmonary/Chest: Effort normal and breath sounds normal. No stridor. No respiratory distress. She has no decreased breath sounds. She has no wheezes. She has no rhonchi. She has no rales.  Abdominal: Soft. Normal appearance and bowel sounds are normal. She exhibits no distension.  There is no tenderness. There is no rebound and no CVA tenderness.  Musculoskeletal: Normal range of motion. She exhibits no edema or tenderness.  Neurological: She is alert and oriented to person, place, and time. She has normal strength. No cranial nerve deficit or sensory deficit. GCS eye subscore is 4. GCS verbal subscore is 5. GCS motor subscore is 6.  Skin: Skin is warm and dry. No abrasion and no rash noted.  Psychiatric: She has a normal mood and affect. Her speech is normal and behavior is normal.  Nursing note and vitals reviewed.    ED Treatments / Results  Labs (all labs ordered are listed, but only abnormal  results are displayed) Labs Reviewed  URINE CULTURE  CBC WITH DIFFERENTIAL/PLATELET  COMPREHENSIVE METABOLIC PANEL  LIPASE, BLOOD  URINALYSIS, ROUTINE W REFLEX MICROSCOPIC  INFLUENZA PANEL BY PCR (TYPE A & B)    EKG  EKG Interpretation  Date/Time:  Friday June 27 2017 08:11:51 EDT Ventricular Rate:  65 PR Interval:    QRS Duration: 99 QT Interval:  464 QTC Calculation: 483 R Axis:   -13 Text Interpretation:  Sinus rhythm Left atrial enlargement Left ventricular hypertrophy No significant change since last tracing Confirmed by Lorre NickAllen, Auriella Wieand ([id number]) on 06/27/2017 9:50:49 AM       Radiology No results found.  Procedures Procedures (including critical care time)  Medications Ordered in ED Medications  0.9 %  sodium chloride infusion (125 mL/hr Intravenous New Bag/Given 06/27/17 0951)  sodium chloride 0.9 % bolus 1,000 mL (1,000 mLs Intravenous New Bag/Given 06/27/17 0948)  ondansetron (ZOFRAN) injection 4 mg (4 mg Intravenous Given 06/27/17 0951)     Initial Impression / Assessment and Plan / ED Course  I have reviewed the triage vital signs and the nursing notes.  Pertinent labs & imaging results that were available during my care of the patient were reviewed by me and considered in my medical decision making (see chart for details).   Patient  given IV fluids here.  Was retaining urine and Foley catheter placed after bladder scan showed 400 cc. Patient workup here negative for flu with normal lactate.  Urinalysis is negative.  Imaging is reassuring.  Patient had vomiting here and abdominal exam repeated and has vague cramping.  Will order abdominal CT for rule out obstruction and signout to next provider  Final Clinical Impressions(s) / ED Diagnoses   Final diagnoses:  None    New Prescriptions New Prescriptions   No medications on file     Lorre NickAllen, Jackee Glasner, MD 06/27/17 1611

## 2017-06-27 NOTE — ED Notes (Signed)
Pure Wick applied to gather UA

## 2017-06-27 NOTE — ED Triage Notes (Signed)
Patient received a flu shot yesterday and has N/V/D ever since. Patient also still having numbness and weakness that [facility] due to a slipped disk.

## 2017-06-27 NOTE — ED Notes (Signed)
Bladder scan 

## 2017-06-27 NOTE — ED Notes (Signed)
[facility] [phone] at 20:00

## 2017-06-27 NOTE — H&P (Addendum)
TRH H&P   Patient Demographics:    Melanie [name], is a 83 y.o. female  MRN: [id number]   DOB - 03/24/1924  Admit Date - 06/27/2017  Outpatient Primary MD for the patient is Creola Cornusso, John, MD  Referring MD/NP/PA: Reinaldo Meekerameron Issacs  Outpatient Specialists:     Patient coming from: home  No chief complaint on file.  Hypokalemia   HPI:    Melanie [name]  is a 83 y.o. female, w hypertension, hyperlipidemia, gerd, apparently c/o not feeling well and having n/v earlier today. Pt denies fever, chills, cough, cp, palp, sob, diarrhea, brbpr. Pt presented to the ED due to n/v.  No bloody emesis.  Pt admits to medication noncompliance, has not taken potassium for the past 1 week.   In ED, pt afebrile Wbc 7.7, Hgb 14.3, Plt 281 Na 138, K 3.0, Bun 13, Creatinine 0.83 Ast 25, Alt 11 Lipase 24 Influenza panel negative ua negative  CT scan abd/pelvis IMPRESSION: 1. Sigmoid diverticulosis but no definite active diverticulitis. No distended bowel identified. 2. Multilevel lumbar impingement especially on the right side due to spurring and scoliosis. 3.  Aortic Atherosclerosis (ICD10-I70.0). 4. Small type 1 hiatal hernia.  EKG nsr at 65, nl axis, no st-t changes c/w ischemia  Pt feels weak per ED, and unable to go home.  Will admit for hypokalemia.       Review of systems:    In addition to the HPI above,  No Fever-chills, No Headache, No changes with Vision or hearing, No problems swallowing food or Liquids, No Chest pain, Cough or Shortness of Breath, No Abdominal pain,  Bowel movements are regular, No Blood in stool or Urine, No dysuria, No new skin rashes or bruises, No new joints pains-aches,  No new weakness, tingling, numbness in any extremity, No recent weight gain or loss, No polyuria, polydypsia or polyphagia, No significant Mental Stressors.  A full 10 point  Review of Systems was done, except as stated above, all other Review of Systems were negative.   With Past History of the following :    Past Medical History:  Diagnosis Date  . Bruises easily    on Aspirin;on hold for surgery  . Carotid stenosis   . CHF (congestive heart failure) (HCC)    diastolic dysfunction  . Chronic back pain    stenosis  . Chronic sinusitis   . Diverticulosis   . Esophageal stricture   . GERD (gastroesophageal reflux disease)    takes Protonix daily  . Heart murmur    as a child  . Hemorrhoids   . History of colon polyps   . Hypertension    takes Amlodipine,HCTZ,and Metoprolol  . Insomnia   . Mitral insufficiency and aortic stenosis   . Palpitations   . Peripheral edema   . Schatzki's ring   . Scoliosis   . Stroke [facility](HCC)  Past Surgical History:  Procedure Laterality Date  . BREAST ENHANCEMENT SURGERY    . CHOLECYSTECTOMY    . COLONOSCOPY    . DILATION AND CURETTAGE OF UTERUS    . epidural injections    . ESOPHAGOGASTRODUODENOSCOPY    . EYE SURGERY     left cataract removed  . LUMBAR LAMINECTOMY/DECOMPRESSION MICRODISCECTOMY Left 01/26/2013   Procedure: LUMBAR LAMINECTOMY/DECOMPRESSION MICRODISCECTOMY 1 LEVEL;  Surgeon: Barnett Abu, MD;  Location: [facility];  Service: Neurosurgery;  Laterality: Left;  Left Lumbar four-five Laminectomy/Foraminotomy  . steroid injections     x 3  . TOTAL HIP ARTHROPLASTY Right       Social History:     Social History  Substance Use Topics  . Smoking status: Never Smoker  . Smokeless tobacco: Never Used  . Alcohol use No     Lives - at home  Mobility - walks by self   Family History :     Family History  Problem Relation Age of Onset  . Rheum arthritis Mother   . Renal cancer Sister   . CAD Brother   . Lung cancer Brother       Home Medications:   Prior to Admission medications   Medication Sig Start Date End Date Taking? Authorizing Provider  Amino Acids (AMINO ACID PO) Take  1 scoop by mouth daily. *[facility]* Has with 12 oz of water everyday   Yes [provider]  amLODipine (NORVASC) 5 MG tablet Take 2.5 mg by mouth 2 (two) times daily.    Yes [provider]  aspirin 325 MG tablet Take 1 tablet (325 mg total) by mouth daily. 07/20/16  Yes Zannie Cove, MD  celecoxib (CELEBREX) 200 MG capsule Take 200 mg by mouth daily as needed for mild pain or moderate pain.  05/30/17  Yes [provider]  Cholecalciferol (VITAMIN D3) 2000 UNITS TABS Take 1 tablet by mouth daily.   Yes [provider]  hydrochlorothiazide (HYDRODIURIL) 25 MG tablet Take 25 mg by mouth daily after breakfast.    Yes [provider]  metoprolol tartrate (LOPRESSOR) 50 MG tablet Take 50 mg by mouth at bedtime.  04/12/17  Yes [provider]  Multiple Vitamin (MULTIVITAMIN) tablet Take 1 tablet by mouth daily.   Yes [provider]  Omega-3 Fatty Acids (FISH OIL PO) Take 2 capsules by mouth daily.   Yes [provider]  pantoprazole (PROTONIX) 40 MG tablet Take 40 mg by mouth daily after breakfast.    Yes [provider]  potassium chloride (KLOR-CON) 20 MEQ packet Take 20 mEq by mouth daily. 07/19/16  Yes Zannie Cove, MD  pravastatin (PRAVACHOL) 20 MG tablet Take 1 tablet (20 mg total) by mouth daily at 6 PM. Patient taking differently: Take 20 mg by mouth daily after breakfast.  07/19/16  Yes Zannie Cove, MD  nitrofurantoin, macrocrystal-monohydrate, (MACROBID) 100 MG capsule Take 1 capsule (100 mg total) by mouth 2 (two) times daily. X 7 days Patient not taking: Reported on 06/27/2017 06/09/17   Geoffery Lyons, MD     Allergies:     Allergies  Allergen Reactions  . Ciprofloxacin Other (See Comments)    Altered Mental Status      Physical Exam:   Vitals  Blood pressure (!) 157/68, pulse 74, temperature 98.7 F (37.1 C), temperature source Oral, resp. rate 14, height 5' (1.524 m), weight 49 kg (108  lb), SpO2 96 %.   1. General  lying in bed in NAD,  2. Normal affect and insight, Not Suicidal or Homicidal, Awake Alert, Oriented X 3.  3. No F.N deficits, ALL C.Nerves Intact, Strength 5/5 all 4 extremities, Sensation intact all 4 extremities, Plantars down going.  4. Ears and Eyes appear Normal, Conjunctivae clear, PERRLA. Moist Oral Mucosa.  5. Supple Neck, No JVD, No cervical lymphadenopathy appriciated, No Carotid Bruits.  6. Symmetrical Chest wall movement, Good air movement bilaterally, CTAB.  7. RRR, No Gallops, Rubs or Murmurs, No Parasternal Heave.  8. Positive Bowel Sounds, Abdomen Soft, No tenderness, No organomegaly appriciated,No rebound -guarding or rigidity.  9.  No Cyanosis, Normal Skin Turgor, No Skin Rash or Bruise.  10. Good muscle tone,  joints appear normal , no effusions, Normal ROM.  11. No Palpable Lymph Nodes in Neck or Axillae     Data Review:    CBC  Recent Labs Lab 06/27/17 0948  WBC 7.7  HGB 14.3  HCT 40.7  PLT 281  MCV 89.8  MCH 31.6  MCHC 35.1  RDW 13.5  LYMPHSABS 1.7  MONOABS 0.4  EOSABS 0.3  BASOSABS 0.0   ------------------------------------------------------------------------------------------------------------------  Chemistries   Recent Labs Lab 06/27/17 0948  NA 138  K 3.0*  CL 101  CO2 22  GLUCOSE 187*  BUN 13  CREATININE 0.83  CALCIUM 9.7  AST 25  ALT 11*  ALKPHOS 84  BILITOT 0.8   ------------------------------------------------------------------------------------------------------------------ estimated creatinine clearance is 32.4 mL/min (by C-G formula based on SCr of 0.83 mg/dL). ------------------------------------------------------------------------------------------------------------------ No results for input(s): TSH, T4TOTAL, T3FREE, THYROIDAB in the last 72 hours.  Invalid input(s): FREET3  Coagulation profile No results for input(s): INR, PROTIME in the last 168  hours. ------------------------------------------------------------------------------------------------------------------- No results for input(s): DDIMER in the last 72 hours. -------------------------------------------------------------------------------------------------------------------  Cardiac Enzymes No results for input(s): CKMB, TROPONINI, MYOGLOBIN in the last 168 hours.  Invalid input(s): CK ------------------------------------------------------------------------------------------------------------------ No results found for: BNP   ---------------------------------------------------------------------------------------------------------------  Urinalysis    Component Value Date/Time   COLORURINE YELLOW 06/27/2017 1450   APPEARANCEUR HAZY (A) 06/27/2017 1450   LABSPEC 1.006 06/27/2017 1450   PHURINE 8.0 06/27/2017 1450   GLUCOSEU NEGATIVE 06/27/2017 1450   HGBUR NEGATIVE 06/27/2017 1450   BILIRUBINUR NEGATIVE 06/27/2017 1450   KETONESUR 5 (A) 06/27/2017 1450   PROTEINUR NEGATIVE 06/27/2017 1450   UROBILINOGEN 0.2 05/03/2010 1200   NITRITE NEGATIVE 06/27/2017 1450   LEUKOCYTESUR NEGATIVE 06/27/2017 1450    ----------------------------------------------------------------------------------------------------------------   Imaging Results:    Ct Head Wo Contrast  Result Date: 06/27/2017 CLINICAL DATA:  NECK PAIN Patient received a flu shot yesterday and has N/V/D ever since. Patient also still having numbness and weakness that she was seen at [facility] for two weeks ago due to a slipped disk EXAM: CT HEAD WITHOUT CONTRAST CT CERVICAL SPINE WITHOUT CONTRAST TECHNIQUE: Multidetector CT imaging of the head and cervical spine was performed following the standard protocol without intravenous contrast. Multiplanar CT image reconstructions of the cervical spine were also generated. COMPARISON:  Cervical MRI, 02/12/2017.  Head CT, 07/17/2016. FINDINGS: CT HEAD FINDINGS Brain: No  evidence of acute infarction, hemorrhage, hydrocephalus, extra-axial collection or mass lesion/mass effect. There is ventricular sulcal enlargement reflecting mild diffuse atrophy. Periventricular white matter hypoattenuation is present consistent with mild chronic microvascular ischemic change. These findings are stable. Vascular: No hyperdense vessel or unexpected calcification. Skull: Normal. Negative for fracture or focal lesion. Sinuses/Orbits: Visualize globes and orbits are unremarkable. The visualized sinuses and mastoid air cells are clear. Other: None. CT CERVICAL SPINE FINDINGS Alignment: Reversal of  normal cervical lordosis, apex at C5-C6. No spondylolisthesis. Skull base and vertebrae: No acute fracture. No primary bone lesion or focal pathologic process. Soft tissues and spinal canal: No spinal canal mass. No surrounding soft tissue masses or enlarged lymph nodes. Disc levels: Marked loss of disc height at C4-C5 and C5-C6. Moderate loss of disc height at C6-C7 with mild loss of disc height at C3-C4. There is spondylotic disc bulging with endplate spurring at these levels, greatest at C4-C5 and C5-C6. Facet degenerative changes noted bilaterally, greatest at C3-C4 bilaterally and C2-C3 on the left. Uncovertebral spurring leads to neural foraminal narrowing, greatest on the right at C3-C4 and C4-C5, moderate in severity. No convincing disc herniation. Upper chest: No masses.  Lung apices are clear. Other: None. IMPRESSION: HEAD CT 1. No acute intracranial abnormalities. 2. Mild diffuse atrophy and chronic microvascular ischemic change. CERVICAL CT 1. No fracture or acute finding. 2. Degenerative changes as described similar to the prior cervical MRI. Electronically Signed   By: Amie Portland M.D.   On: 06/27/2017 10:52   Ct Cervical Spine Wo Contrast  Result Date: 06/27/2017 CLINICAL DATA:  NECK PAIN Patient received a flu shot yesterday and has N/V/D ever since. Patient also still having numbness  and weakness that she was seen at [facility] for two weeks ago due to a slipped disk EXAM: CT HEAD WITHOUT CONTRAST CT CERVICAL SPINE WITHOUT CONTRAST TECHNIQUE: Multidetector CT imaging of the head and cervical spine was performed following the standard protocol without intravenous contrast. Multiplanar CT image reconstructions of the cervical spine were also generated. COMPARISON:  Cervical MRI, 02/12/2017.  Head CT, 07/17/2016. FINDINGS: CT HEAD FINDINGS Brain: No evidence of acute infarction, hemorrhage, hydrocephalus, extra-axial collection or mass lesion/mass effect. There is ventricular sulcal enlargement reflecting mild diffuse atrophy. Periventricular white matter hypoattenuation is present consistent with mild chronic microvascular ischemic change. These findings are stable. Vascular: No hyperdense vessel or unexpected calcification. Skull: Normal. Negative for fracture or focal lesion. Sinuses/Orbits: Visualize globes and orbits are unremarkable. The visualized sinuses and mastoid air cells are clear. Other: None. CT CERVICAL SPINE FINDINGS Alignment: Reversal of normal cervical lordosis, apex at C5-C6. No spondylolisthesis. Skull base and vertebrae: No acute fracture. No primary bone lesion or focal pathologic process. Soft tissues and spinal canal: No spinal canal mass. No surrounding soft tissue masses or enlarged lymph nodes. Disc levels: Marked loss of disc height at C4-C5 and C5-C6. Moderate loss of disc height at C6-C7 with mild loss of disc height at C3-C4. There is spondylotic disc bulging with endplate spurring at these levels, greatest at C4-C5 and C5-C6. Facet degenerative changes noted bilaterally, greatest at C3-C4 bilaterally and C2-C3 on the left. Uncovertebral spurring leads to neural foraminal narrowing, greatest on the right at C3-C4 and C4-C5, moderate in severity. No convincing disc herniation. Upper chest: No masses.  Lung apices are clear. Other: None. IMPRESSION: HEAD CT 1. No acute  intracranial abnormalities. 2. Mild diffuse atrophy and chronic microvascular ischemic change. CERVICAL CT 1. No fracture or acute finding. 2. Degenerative changes as described similar to the prior cervical MRI. Electronically Signed   By: Amie Portland M.D.   On: 06/27/2017 10:52   Ct Abdomen Pelvis W Contrast  Result Date: 06/27/2017 CLINICAL DATA:  Paresthesias in the extremities. Emesis and diarrhea. Abdominal distention. EXAM: CT ABDOMEN AND PELVIS WITH CONTRAST TECHNIQUE: Multidetector CT imaging of the abdomen and pelvis was performed using the standard protocol following bolus administration of intravenous contrast. CONTRAST:  ISOVUE-300 IOPAMIDOL (  ISOVUE-300) INJECTION 61% COMPARISON:  06/09/2017 FINDINGS: Please note that a precontrast scan of the abdomen was performed on this patient prior to recognition that only postcontrast imaging was requested. I have reviewed these precontrast images in the context of this interpretation. Right pelvic structures are partially obscured by streak artifact from the patient's right total hip implant. Lower chest: Scarring or atelectasis posteriorly in the left lower lobe. Density along the mitral valve. Descending thoracic aortic atherosclerotic calcification. Small type 1 hiatal hernia. Hepatobiliary: Cholecystectomy. Common bile duct 8 mm in diameter, within normal limits. Pancreas: Unremarkable Spleen: Unremarkable Adrenals/Urinary Tract: Unremarkable Stomach/Bowel: Sigmoid diverticulosis without definite active diverticulitis, although sensitivity for mild diverticulitis is reduced by the crowding of the sigmoid colon. Appendix normal. No distended bowel. Vascular/Lymphatic: Aortoiliac atherosclerotic vascular disease. Reproductive: Unremarkable Other: No supplemental non-categorized findings. Musculoskeletal: Notable levoconvex lumbar scoliosis with rotary component. Right total hip prosthesis. Left foraminal impingement at L4-5 due to spurring. Right  foraminal impingement at T12-L1, L1-2, L2- 3, and L3-4 primarily due to spurring and scoliosis. Degenerative disc disease throughout the lumbar spine. IMPRESSION: 1. Sigmoid diverticulosis but no definite active diverticulitis. No distended bowel identified. 2. Multilevel lumbar impingement especially on the right side due to spurring and scoliosis. 3.  Aortic Atherosclerosis (ICD10-I70.0). 4. Small type 1 hiatal hernia. Electronically Signed   By: Gaylyn Rong M.D.   On: 06/27/2017 18:54       Assessment & Plan:    Active Problems:   Hypokalemia  N/v CXR Trop i zofran protonix  Hypokalemia Replete Check cmp in am  Hyperglycemia Check hga1c  Hypertension Cont metoprolol Cont hydrochlorothiazide (maybe cause of hypokalemia) Cont norvasc  Hyperlipidemia Cont pravastatin  Gerd Cont protonix      DVT Prophylaxis Lovenox - SCDs   AM Labs Ordered, also please review Full Orders  Family Communication: Admission, patients condition and plan of care including tests being ordered have been discussed with the patient  who indicate understanding and agree with the plan and Code Status.  Code Status FULL CODE  Likely DC to  home  Condition GUARDED    Consults called: none  Admission status: observation   Time spent in minutes : 45   Pearson Grippe M.D on 06/27/2017 at 8:04 PM  Between 7am to 7pm - Pager - [phone]  . After 7pm go to [email or web address] - password [facility]  [facility] - Office  [phone]

## 2017-06-28 DIAGNOSIS — R112 Nausea with vomiting, unspecified: Secondary | ICD-10-CM | POA: Diagnosis not present

## 2017-06-28 DIAGNOSIS — E876 Hypokalemia: Secondary | ICD-10-CM

## 2017-06-28 LAB — CBC
HCT: 34.2 % — ABNORMAL LOW (ref 36.0–46.0)
HEMOGLOBIN: 11.6 g/dL — AB (ref 12.0–15.0)
MCH: 31.3 pg (ref 26.0–34.0)
MCHC: 33.9 g/dL (ref 30.0–36.0)
MCV: 92.2 fL (ref 78.0–100.0)
PLATELETS: 220 10*3/uL (ref 150–400)
RBC: 3.71 MIL/uL — AB (ref 3.87–5.11)
RDW: 14 % (ref 11.5–15.5)
WBC: 7 10*3/uL (ref 4.0–10.5)

## 2017-06-28 LAB — COMPREHENSIVE METABOLIC PANEL
ALT: 9 U/L — AB (ref 14–54)
ANION GAP: 6 (ref 5–15)
AST: 21 U/L (ref 15–41)
Albumin: 2.9 g/dL — ABNORMAL LOW (ref 3.5–5.0)
Alkaline Phosphatase: 66 U/L (ref 38–126)
BUN: 8 mg/dL (ref 6–20)
CHLORIDE: 111 mmol/L (ref 101–111)
CO2: 24 mmol/L (ref 22–32)
CREATININE: 0.64 mg/dL (ref 0.44–1.00)
Calcium: 8.9 mg/dL (ref 8.9–10.3)
Glucose, Bld: 95 mg/dL (ref 65–99)
POTASSIUM: 3.3 mmol/L — AB (ref 3.5–5.1)
SODIUM: 141 mmol/L (ref 135–145)
Total Bilirubin: 0.4 mg/dL (ref 0.3–1.2)
Total Protein: 5.5 g/dL — ABNORMAL LOW (ref 6.5–8.1)

## 2017-06-28 LAB — URINE CULTURE: CULTURE: NO GROWTH

## 2017-06-28 MED ORDER — SACCHAROMYCES BOULARDII 250 MG PO CAPS
250.0000 mg | ORAL_CAPSULE | Freq: Two times a day (BID) | ORAL | Status: DC
  Administered 2017-06-28: 250 mg via ORAL
  Filled 2017-06-28: qty 1

## 2017-06-28 MED ORDER — POTASSIUM CHLORIDE CRYS ER 20 MEQ PO TBCR
40.0000 meq | EXTENDED_RELEASE_TABLET | Freq: Every day | ORAL | Status: DC
  Administered 2017-06-28: 40 meq via ORAL
  Filled 2017-06-28: qty 2

## 2017-06-28 MED ORDER — SACCHAROMYCES BOULARDII 250 MG PO CAPS: 250.0000 mg | ORAL_CAPSULE | Freq: Two times a day (BID) | ORAL | 1 refills | Status: DC

## 2017-06-28 MED ORDER — HYDROCHLOROTHIAZIDE 12.5 MG PO TABS: 12.5000 mg | ORAL_TABLET | Freq: Every day | ORAL | 2 refills | Status: DC

## 2017-06-28 MED ORDER — ALUM & MAG HYDROXIDE-SIMETH 200-200-20 MG/5ML PO SUSP
30.0000 mL | Freq: Four times a day (QID) | ORAL | Status: DC | PRN
  Filled 2017-06-28: qty 30

## 2017-06-28 NOTE — Care Management Note (Signed)
Case Management Note  Patient Details  Name: Melanie [name] MRN: [id number] Date of Birth: 04/21/1924  Subjective/Objective:                HH orders for PT and RN. [facility] w [facility] and would [facility] use. [facility] No other CM needs identified. CM signing off.    Action/Plan:   Expected Discharge Date:  06/28/17               Expected Discharge Plan:  Home w Home Health Services  In-House Referral:     Discharge planning Services  CM Consult  Post Acute Care Choice:    Choice offered to:     DME Arranged:    DME Agency:     HH Arranged:  RN, PT [facility]  Status of Service:  Completed, signed off  If discussed at Long Length of Stay Meetings, dates discussed:    Additional Comments:  Lawerance SabalDebbie Deniyah Dillavou, RN 06/28/2017, 12:37 PM

## 2017-06-28 NOTE — Discharge Summary (Signed)
Physician Discharge Summary  Melanie [name] UJW:[id number] DOB: 1923/09/03 DOA: 06/27/2017  PCP: Creola Corn, MD  Admit date: 06/27/2017 Discharge date: 06/28/2017  Time spent: 25 minutes  Recommendations for Outpatient Follow-up:  1. Follow-up PCP in 2 weeks 2. Patient will be discharged on home health RN, physical therapy   Discharge Diagnoses:  Active Problems:   Hypokalemia   Discharge Condition: Stable  Diet recommendation: Low-salt diet  Filed Weights   06/27/17 0808 06/28/17 0520  Weight: 49 kg (108 lb) 53.2 kg (117 lb 4.6 oz)    History of present illness:   83 y.o. female, w hypertension, hyperlipidemia, gerd, apparently c/o not feeling well and having n/v earlier today. Pt denies fever, chills, cough, cp, palp, sob, diarrhea, brbpr. Pt presented to the ED due to n/v.  No bloody emesis.  Pt admits to medication noncompliance, has not taken potassium for the past 1 week.   Hospital Course:   Intractable nausea and vomiting-resolved, likely viral gastroenteritis.  Patient ate breakfast without any complaints.  Hypokalemia-potassium is 3.3, will give additional dose of K Dur 40 mEq p.o. x1.  Patient will continue to take K-Dur 20 mg p.o. daily.  She takes HCTZ which is likely the cause of hypokalemia.  Will cut out the dose of HCTZ to 12.5 mg p.o. Daily  Hypertension-continue metoprolol, Norvasc, will cut down the dose of HCTZ to 12.5 mg p.o. daily due to hypokalemia.  Hyperlipidemia-continue pravastatin  GERD-continue Protonix  Procedures:  None   Consultations:  None  Discharge Exam: Vitals:   06/28/17 0520 06/28/17 0838  BP: (!) 116/48 (!) 127/48  Pulse: 64 (!) 59  Resp: 16   Temp: 98 F (36.7 C)   SpO2: 97%     General: Appears in no acute distress Cardiovascular: S1-S2, regular Respiratory: Clear to auscultation bilaterally  Discharge Instructions   Discharge Instructions    Diet - low sodium heart healthy    Complete by:  As directed     Increase activity slowly    Complete by:  As directed      Current Discharge Medication List    START taking these medications   Details  saccharomyces boulardii (FLORASTOR) 250 MG capsule Take 1 capsule (250 mg total) by mouth 2 (two) times daily. Qty: 60 capsule, Refills: 1      CONTINUE these medications which have NOT CHANGED   Details  Amino Acids (AMINO ACID PO) Take 1 scoop by mouth daily. *[facility]* Has with 12 oz of water everyday    amLODipine (NORVASC) 5 MG tablet Take 2.5 mg by mouth 2 (two) times daily.     aspirin 325 MG tablet Take 1 tablet (325 mg total) by mouth daily.    celecoxib (CELEBREX) 200 MG capsule Take 200 mg by mouth daily as needed for mild pain or moderate pain.  Refills: 12    Cholecalciferol (VITAMIN D3) 2000 UNITS TABS Take 1 tablet by mouth daily.    hydrochlorothiazide (HYDRODIURIL) 12.5 MG tablet Take 12.5 mg by mouth daily after breakfast.     metoprolol tartrate (LOPRESSOR) 50 MG tablet Take 50 mg by mouth at bedtime.  Refills: 3    Multiple Vitamin (MULTIVITAMIN) tablet Take 1 tablet by mouth daily.    Omega-3 Fatty Acids (FISH OIL PO) Take 2 capsules by mouth daily.    pantoprazole (PROTONIX) 40 MG tablet Take 40 mg by mouth daily after breakfast.     potassium chloride (KLOR-CON) 20 MEQ packet Take 20 mEq by mouth daily.  Qty: 30 tablet, Refills: 0    pravastatin (PRAVACHOL) 20 MG tablet Take 1 tablet (20 mg total) by mouth daily at 6 PM. Qty: 30 tablet, Refills: 0    nitrofurantoin, macrocrystal-monohydrate, (MACROBID) 100 MG capsule Take 1 capsule (100 mg total) by mouth 2 (two) times daily. X 7 days Qty: 14 capsule, Refills: 0       Allergies  Allergen Reactions  . Ciprofloxacin Other (See Comments)    Altered Mental Status       The results of significant diagnostics from this hospitalization (including imaging, microbiology, ancillary and laboratory) are listed below for reference.    Significant Diagnostic  Studies: Dg Chest 2 View  Result Date: 06/27/2017 CLINICAL DATA:  Nausea and vomiting.  Dizziness.  Weakness. EXAM: CHEST  2 VIEW COMPARISON:  Radiograph 06/08/2017 FINDINGS: The cardiomediastinal contours are normal. Atherosclerosis of the aorta. Pulmonary vasculature is normal. No consolidation, pleural effusion, or pneumothorax. No acute osseous abnormalities are seen. Again seen scoliotic curvature of spine. Peripherally calcified breast implants noted. IMPRESSION: No acute pulmonary process. Electronically Signed   By: Rubye Oaks M.D.   On: 06/27/2017 23:45   Dg Chest 2 View  Result Date: 06/08/2017 CLINICAL DATA:  Altered mental status at 19:30 tonight. EXAM: CHEST  2 VIEW COMPARISON:  07/17/2016 FINDINGS: The lungs are clear. The pulmonary vasculature is normal. Heart size is normal. Hilar and mediastinal contours are unremarkable. There is no pleural effusion. IMPRESSION: No active cardiopulmonary disease. Electronically Signed   By: Ellery Plunk M.D.   On: 06/08/2017 22:38   Ct Head Wo Contrast  Result Date: 06/27/2017 CLINICAL DATA:  NECK PAIN Patient received a flu shot yesterday and has N/V/D ever since. Patient also still having numbness and weakness that she was seen at [facility] for two weeks ago due to a slipped disk EXAM: CT HEAD WITHOUT CONTRAST CT CERVICAL SPINE WITHOUT CONTRAST TECHNIQUE: Multidetector CT imaging of the head and cervical spine was performed following the standard protocol without intravenous contrast. Multiplanar CT image reconstructions of the cervical spine were also generated. COMPARISON:  Cervical MRI, 02/12/2017.  Head CT, 07/17/2016. FINDINGS: CT HEAD FINDINGS Brain: No evidence of acute infarction, hemorrhage, hydrocephalus, extra-axial collection or mass lesion/mass effect. There is ventricular sulcal enlargement reflecting mild diffuse atrophy. Periventricular white matter hypoattenuation is present consistent with mild chronic microvascular ischemic  change. These findings are stable. Vascular: No hyperdense vessel or unexpected calcification. Skull: Normal. Negative for fracture or focal lesion. Sinuses/Orbits: Visualize globes and orbits are unremarkable. The visualized sinuses and mastoid air cells are clear. Other: None. CT CERVICAL SPINE FINDINGS Alignment: Reversal of normal cervical lordosis, apex at C5-C6. No spondylolisthesis. Skull base and vertebrae: No acute fracture. No primary bone lesion or focal pathologic process. Soft tissues and spinal canal: No spinal canal mass. No surrounding soft tissue masses or enlarged lymph nodes. Disc levels: Marked loss of disc height at C4-C5 and C5-C6. Moderate loss of disc height at C6-C7 with mild loss of disc height at C3-C4. There is spondylotic disc bulging with endplate spurring at these levels, greatest at C4-C5 and C5-C6. Facet degenerative changes noted bilaterally, greatest at C3-C4 bilaterally and C2-C3 on the left. Uncovertebral spurring leads to neural foraminal narrowing, greatest on the right at C3-C4 and C4-C5, moderate in severity. No convincing disc herniation. Upper chest: No masses.  Lung apices are clear. Other: None. IMPRESSION: HEAD CT 1. No acute intracranial abnormalities. 2. Mild diffuse atrophy and chronic microvascular ischemic change. CERVICAL CT 1. No  fracture or acute finding. 2. Degenerative changes as described similar to the prior cervical MRI. Electronically Signed   By: Amie Portland M.D.   On: 06/27/2017 10:52   Ct Cervical Spine Wo Contrast  Result Date: 06/27/2017 CLINICAL DATA:  NECK PAIN Patient received a flu shot yesterday and has N/V/D ever since. Patient also still having numbness and weakness that she was seen at [facility] for two weeks ago due to a slipped disk EXAM: CT HEAD WITHOUT CONTRAST CT CERVICAL SPINE WITHOUT CONTRAST TECHNIQUE: Multidetector CT imaging of the head and cervical spine was performed following the standard protocol without intravenous contrast.  Multiplanar CT image reconstructions of the cervical spine were also generated. COMPARISON:  Cervical MRI, 02/12/2017.  Head CT, 07/17/2016. FINDINGS: CT HEAD FINDINGS Brain: No evidence of acute infarction, hemorrhage, hydrocephalus, extra-axial collection or mass lesion/mass effect. There is ventricular sulcal enlargement reflecting mild diffuse atrophy. Periventricular white matter hypoattenuation is present consistent with mild chronic microvascular ischemic change. These findings are stable. Vascular: No hyperdense vessel or unexpected calcification. Skull: Normal. Negative for fracture or focal lesion. Sinuses/Orbits: Visualize globes and orbits are unremarkable. The visualized sinuses and mastoid air cells are clear. Other: None. CT CERVICAL SPINE FINDINGS Alignment: Reversal of normal cervical lordosis, apex at C5-C6. No spondylolisthesis. Skull base and vertebrae: No acute fracture. No primary bone lesion or focal pathologic process. Soft tissues and spinal canal: No spinal canal mass. No surrounding soft tissue masses or enlarged lymph nodes. Disc levels: Marked loss of disc height at C4-C5 and C5-C6. Moderate loss of disc height at C6-C7 with mild loss of disc height at C3-C4. There is spondylotic disc bulging with endplate spurring at these levels, greatest at C4-C5 and C5-C6. Facet degenerative changes noted bilaterally, greatest at C3-C4 bilaterally and C2-C3 on the left. Uncovertebral spurring leads to neural foraminal narrowing, greatest on the right at C3-C4 and C4-C5, moderate in severity. No convincing disc herniation. Upper chest: No masses.  Lung apices are clear. Other: None. IMPRESSION: HEAD CT 1. No acute intracranial abnormalities. 2. Mild diffuse atrophy and chronic microvascular ischemic change. CERVICAL CT 1. No fracture or acute finding. 2. Degenerative changes as described similar to the prior cervical MRI. Electronically Signed   By: Amie Portland M.D.   On: 06/27/2017 10:52   Ct  Abdomen Pelvis W Contrast  Result Date: 06/27/2017 CLINICAL DATA:  Paresthesias in the extremities. Emesis and diarrhea. Abdominal distention. EXAM: CT ABDOMEN AND PELVIS WITH CONTRAST TECHNIQUE: Multidetector CT imaging of the abdomen and pelvis was performed using the standard protocol following bolus administration of intravenous contrast. CONTRAST:  ISOVUE-300 IOPAMIDOL (ISOVUE-300) INJECTION 61% COMPARISON:  06/09/2017 FINDINGS: Please note that a precontrast scan of the abdomen was performed on this patient prior to recognition that only postcontrast imaging was requested. I have reviewed these precontrast images in the context of this interpretation. Right pelvic structures are partially obscured by streak artifact from the patient's right total hip implant. Lower chest: Scarring or atelectasis posteriorly in the left lower lobe. Density along the mitral valve. Descending thoracic aortic atherosclerotic calcification. Small type 1 hiatal hernia. Hepatobiliary: Cholecystectomy. Common bile duct 8 mm in diameter, within normal limits. Pancreas: Unremarkable Spleen: Unremarkable Adrenals/Urinary Tract: Unremarkable Stomach/Bowel: Sigmoid diverticulosis without definite active diverticulitis, although sensitivity for mild diverticulitis is reduced by the crowding of the sigmoid colon. Appendix normal. No distended bowel. Vascular/Lymphatic: Aortoiliac atherosclerotic vascular disease. Reproductive: Unremarkable Other: No supplemental non-categorized findings. Musculoskeletal: Notable levoconvex lumbar scoliosis with rotary component. Right total  hip prosthesis. Left foraminal impingement at L4-5 due to spurring. Right foraminal impingement at T12-L1, L1-2, L2- 3, and L3-4 primarily due to spurring and scoliosis. Degenerative disc disease throughout the lumbar spine. IMPRESSION: 1. Sigmoid diverticulosis but no definite active diverticulitis. No distended bowel identified. 2. Multilevel lumbar impingement  especially on the right side due to spurring and scoliosis. 3.  Aortic Atherosclerosis (ICD10-I70.0). 4. Small type 1 hiatal hernia. Electronically Signed   By: Gaylyn RongWalter  Liebkemann M.D.   On: 06/27/2017 18:54   Ct Abdomen Pelvis W Contrast  Result Date: 06/09/2017 CLINICAL DATA:  Abdominal pain, nausea and vomiting. Fever. Recent bladder infection. EXAM: CT ABDOMEN AND PELVIS WITH CONTRAST TECHNIQUE: Multidetector CT imaging of the abdomen and pelvis was performed using the standard protocol following bolus administration of intravenous contrast. CONTRAST:  80 mL Isovue-300 COMPARISON:  05/16/2016 FINDINGS: Lower chest: Right breast implant incompletely visualized. Mild atelectasis in the lung bases. Hepatobiliary: No focal liver abnormality is seen. Status post cholecystectomy. No biliary dilatation. Pancreas: Unremarkable. No pancreatic ductal dilatation or surrounding inflammatory changes. Spleen: Normal in size without focal abnormality. Adrenals/Urinary Tract: Adrenal glands are unremarkable. Kidneys are normal, without renal calculi, focal lesion, or hydronephrosis. Bladder is unremarkable. Stomach/Bowel: Stomach and small bowel are unremarkable in appearance. Stool-filled colon. No colonic distention or wall thickening. Diffuse colonic diverticulosis with prominent sigmoid diverticula. No definite inflammatory infiltration but streak artifact from right hip arthroplasty limits evaluation and focal area of diverticulitis could not be entirely excluded in the affected area. Appendix is normal. Vascular/Lymphatic: Aortic atherosclerosis. No enlarged abdominal or pelvic lymph nodes. Reproductive: Uterus and bilateral adnexa are unremarkable. Other: No abdominal wall hernia or abnormality. No abdominopelvic ascites. Musculoskeletal: Right total hip arthroplasty. Thoracolumbar scoliosis convex towards the left. Degenerative changes in the spine. No destructive bone lesions. IMPRESSION: No acute process is  demonstrated in the abdomen or pelvis. Colonic diverticulosis without evidence of diverticulitis. Aortic atherosclerosis. Electronically Signed   By: Burman NievesWilliam  Stevens M.D.   On: 06/09/2017 02:54    Microbiology: No results found for this or any previous visit (from the past 240 hour(s)).   Labs: Basic Metabolic Panel:  Recent Labs Lab 06/27/17 0948 06/28/17 0604  NA 138 141  K 3.0* 3.3*  CL 101 111  CO2 22 24  GLUCOSE 187* 95  BUN 13 8  CREATININE 0.83 0.64  CALCIUM 9.7 8.9   Liver Function Tests:  Recent Labs Lab 06/27/17 0948 06/28/17 0604  AST 25 21  ALT 11* 9*  ALKPHOS 84 66  BILITOT 0.8 0.4  PROT 7.0 5.5*  ALBUMIN 3.8 2.9*    Recent Labs Lab 06/27/17 0948  LIPASE 24   No results for input(s): AMMONIA in the last 168 hours. CBC:  Recent Labs Lab 06/27/17 0948 06/28/17 0604  WBC 7.7 7.0  NEUTROABS 5.3  --   HGB 14.3 11.6*  HCT 40.7 34.2*  MCV 89.8 92.2  PLT 281 220   Cardiac Enzymes:  Recent Labs Lab 06/27/17 2135  TROPONINI <0.03       Signed:  Meredeth IdeLAMA,Cambell Stanek S MD.  [facility] 06/28/2017, 12:01 PM

## 2017-09-09 ENCOUNTER — Ambulatory Visit: Payer: Medicare Other | Admitting: Cardiovascular Disease

## 2017-11-05 ENCOUNTER — Encounter (HOSPITAL_COMMUNITY): Payer: Self-pay | Admitting: Emergency Medicine

## 2017-11-05 ENCOUNTER — Other Ambulatory Visit: Payer: Self-pay

## 2017-11-05 ENCOUNTER — Emergency Department (HOSPITAL_COMMUNITY): Payer: Medicare Other

## 2017-11-05 ENCOUNTER — Emergency Department (HOSPITAL_COMMUNITY)
Admission: EM | Admit: 2017-11-05 | Discharge: 2017-11-05 | Disposition: A | Discharge status: Home / Self Care | Payer: Medicare Other | Attending: Emergency Medicine | Admitting: Emergency Medicine

## 2017-11-05 DIAGNOSIS — R5383 Other fatigue: Secondary | ICD-10-CM | POA: Diagnosis not present

## 2017-11-05 DIAGNOSIS — R110 Nausea: Secondary | ICD-10-CM | POA: Diagnosis not present

## 2017-11-05 DIAGNOSIS — R05 Cough: Secondary | ICD-10-CM | POA: Diagnosis not present

## 2017-11-05 DIAGNOSIS — R0789 Other chest pain: Secondary | ICD-10-CM | POA: Diagnosis not present

## 2017-11-05 DIAGNOSIS — I10 Essential (primary) hypertension: Secondary | ICD-10-CM | POA: Diagnosis not present

## 2017-11-05 DIAGNOSIS — I509 Heart failure, unspecified: Secondary | ICD-10-CM | POA: Diagnosis not present

## 2017-11-05 DIAGNOSIS — R059 Cough, unspecified: Secondary | ICD-10-CM

## 2017-11-05 DIAGNOSIS — R1013 Epigastric pain: Secondary | ICD-10-CM | POA: Diagnosis not present

## 2017-11-05 DIAGNOSIS — Z79899 Other long term (current) drug therapy: Secondary | ICD-10-CM | POA: Diagnosis not present

## 2017-11-05 LAB — URINALYSIS, ROUTINE W REFLEX MICROSCOPIC
Bacteria, UA: NONE SEEN
Bilirubin Urine: NEGATIVE
GLUCOSE, UA: NEGATIVE mg/dL
HGB URINE DIPSTICK: NEGATIVE
KETONES UR: 5 mg/dL — AB
Nitrite: NEGATIVE
PROTEIN: NEGATIVE mg/dL
Specific Gravity, Urine: 1.004 — ABNORMAL LOW (ref 1.005–1.030)
pH: 7 (ref 5.0–8.0)

## 2017-11-05 LAB — CBC WITH DIFFERENTIAL/PLATELET
BASOS ABS: 0 10*3/uL (ref 0.0–0.1)
Basophils Relative: 0 %
EOS PCT: 2 %
Eosinophils Absolute: 0.1 10*3/uL (ref 0.0–0.7)
HCT: 42.2 % (ref 36.0–46.0)
Hemoglobin: 14.2 g/dL (ref 12.0–15.0)
LYMPHS ABS: 1.9 10*3/uL (ref 0.7–4.0)
LYMPHS PCT: 24 %
MCH: 31 pg (ref 26.0–34.0)
MCHC: 33.6 g/dL (ref 30.0–36.0)
MCV: 92.1 fL (ref 78.0–100.0)
MONO ABS: 0.9 10*3/uL (ref 0.1–1.0)
Monocytes Relative: 11 %
NEUTROS ABS: 5 10*3/uL (ref 1.7–7.7)
Neutrophils Relative %: 63 %
PLATELETS: 221 10*3/uL (ref 150–400)
RBC: 4.58 MIL/uL (ref 3.87–5.11)
RDW: 13.6 % (ref 11.5–15.5)
WBC: 7.9 10*3/uL (ref 4.0–10.5)

## 2017-11-05 LAB — COMPREHENSIVE METABOLIC PANEL
ALT: 13 U/L — ABNORMAL LOW (ref 14–54)
ANION GAP: 9 (ref 5–15)
AST: 24 U/L (ref 15–41)
Albumin: 3.7 g/dL (ref 3.5–5.0)
Alkaline Phosphatase: 74 U/L (ref 38–126)
BUN: 9 mg/dL (ref 6–20)
CHLORIDE: 104 mmol/L (ref 101–111)
CO2: 26 mmol/L (ref 22–32)
Calcium: 9.1 mg/dL (ref 8.9–10.3)
Creatinine, Ser: 0.85 mg/dL (ref 0.44–1.00)
GFR calc Af Amer: >60 mL/min (ref >60)
GFR calc non Af Amer: 58 mL/min — ABNORMAL LOW (ref >60)
GLUCOSE: 99 mg/dL (ref 65–99)
POTASSIUM: 4.7 mmol/L (ref 3.5–5.1)
SODIUM: 139 mmol/L (ref 135–145)
Total Bilirubin: 0.5 mg/dL (ref 0.3–1.2)
Total Protein: 6.7 g/dL (ref 6.5–8.1)

## 2017-11-05 LAB — I-STAT TROPONIN, ED: Troponin i, poc: 0.01 ng/mL (ref 0.00–0.08)

## 2017-11-05 LAB — LIPASE, BLOOD: Lipase: 26 U/L (ref 11–51)

## 2017-11-05 NOTE — ED Provider Notes (Signed)
[facility]-EMERGENCY DEPT Provider Note   CSN: [id number] Arrival date & time: 11/05/17  1215     History   Chief Complaint Chief Complaint  Patient presents with  . Cough    HPI Melanie [name] is a 84 y.o. female past medical history of CHF, diverticulosis, GERD who presents for evaluation of generalized weakness, fatigue that has been ongoing for the last week.  Patient reports that initially, symptoms began with nasal congestion, rhinorrhea.  Patient reports that since then, she has began feeling very weak and fatigued.  Patient reports that she does not have any energy.  Patient reports that she had one day of diarrhea.  No blood noted in the stool.  Patient reports that a few days ago, she began developing some chest discomfort.  Patient states that it is an "uncomfortable sensation in the midsternal region." She states that it is not worse with exertion, deep inspiration.  Patient states that she has had some nausea but denies any vomiting, diaphoresis.  Patient reports that she feels like she is having some decreased appetite secondary to symptoms.  Patient reports that she initially had some cough but states that has improved.  Patient denies any fevers, difficulty breathing, abdominal pain, dysuria, hematuria, vision changes, difficulty speaking.  The history is provided by the patient.    Past Medical History:  Diagnosis Date  . Bruises easily    on Aspirin;on hold for surgery  . Carotid stenosis   . CHF (congestive heart failure) (HCC)    diastolic dysfunction  . Chronic back pain    stenosis  . Chronic sinusitis   . Diverticulosis   . Esophageal stricture   . GERD (gastroesophageal reflux disease)    takes Protonix daily  . Heart murmur    as a child  . Hemorrhoids   . History of colon polyps   . Hypertension    takes Amlodipine,HCTZ,and Metoprolol  . Insomnia   . Mitral insufficiency and aortic stenosis   . Palpitations   . Peripheral  edema   . Schatzki's ring   . Scoliosis   . Stroke [facility])     Patient Active Problem List   Diagnosis Date Noted  . Palpitations   . Acute CVA (cerebrovascular accident) (HCC) 07/18/2016  . TIA (transient ischemic attack)   . Hyperlipidemia   . Cerebrovascular accident (CVA) due to embolism of left middle cerebral artery (HCC)   . 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 Essential hypertension 07/17/2016  . Hypokalemia 07/17/2016  . Mitral regurgitation 02/24/2015  . Spinal stenosis, lumbar region, with neurogenic claudication 01/27/2013  . Lumbar scoliosis 01/27/2013    Past Surgical History:  Procedure Laterality Date  . BREAST ENHANCEMENT SURGERY    . CHOLECYSTECTOMY    . COLONOSCOPY    . DILATION AND CURETTAGE OF UTERUS    . epidural injections    . ESOPHAGOGASTRODUODENOSCOPY    . EYE SURGERY     left cataract removed  . LUMBAR LAMINECTOMY/DECOMPRESSION MICRODISCECTOMY Left 01/26/2013   Procedure: LUMBAR LAMINECTOMY/DECOMPRESSION MICRODISCECTOMY 1 LEVEL;  Surgeon: Barnett Abu, MD;  Location: [facility];  Service: Neurosurgery;  Laterality: Left;  Left Lumbar four-five Laminectomy/Foraminotomy  . steroid injections     x 3  . TOTAL HIP ARTHROPLASTY Right     OB History    No data available       Home Medications    Prior to Admission medications   Medication Sig Start Date End Date Taking? Authorizing  Provider  alendronate (FOSAMAX) 70 MG tablet Take 70 mg by mouth once a week. 09/04/17  Yes [provider]  Amino Acids (AMINO ACID PO) Take 1 scoop by mouth daily. *[facility]* Has with 12 oz of water everyday   Yes [provider]  amLODipine (NORVASC) 5 MG tablet Take 2.5 mg by mouth 2 (two) times daily.    Yes [provider]  Ascorbic Acid (VITAMIN C PO) Take 1 tablet by mouth daily.   Yes [provider]  celecoxib (CELEBREX) 200 MG capsule Take 200 mg by mouth daily as needed for mild pain or moderate pain.   05/30/17  Yes [provider]  Cholecalciferol (VITAMIN D3) 2000 UNITS TABS Take 1 tablet by mouth daily.   Yes [provider]  metoprolol tartrate (LOPRESSOR) 50 MG tablet Take 50 mg by mouth at bedtime.  04/12/17  Yes [provider]  Multiple Vitamin (MULTIVITAMIN) tablet Take 1 tablet by mouth daily.   Yes [provider]  Omega-3 Fatty Acids (FISH OIL PO) Take 2 capsules by mouth daily.   Yes [provider]  pantoprazole (PROTONIX) 40 MG tablet Take 40 mg by mouth daily after breakfast.    Yes [provider]  potassium chloride (KLOR-CON) 20 MEQ packet Take 20 mEq by mouth daily. 07/19/16  Yes Zannie CoveJoseph, Preetha, MD  pravastatin (PRAVACHOL) 20 MG tablet Take 1 tablet (20 mg total) by mouth daily at 6 PM. Patient taking differently: Take 20 mg by mouth daily after breakfast.  07/19/16  Yes Zannie CoveJoseph, Preetha, MD  saccharomyces boulardii (FLORASTOR) 250 MG capsule Take 1 capsule (250 mg total) by mouth 2 (two) times daily. 06/28/17  Yes Meredeth IdeLama, Gagan S, MD  aspirin 325 MG tablet Take 1 tablet (325 mg total) by mouth daily. Patient not taking: Reported on 11/05/2017 07/20/16   Zannie CoveJoseph, Preetha, MD  hydrochlorothiazide (HYDRODIURIL) 12.5 MG tablet Take 1 tablet (12.5 mg total) by mouth daily after breakfast. Patient not taking: Reported on 11/05/2017 06/28/17   Meredeth IdeLama, Gagan S, MD  nitrofurantoin, macrocrystal-monohydrate, (MACROBID) 100 MG capsule Take 1 capsule (100 mg total) by mouth 2 (two) times daily. X 7 days Patient not taking: Reported on 11/05/2017 06/09/17   Geoffery Lyonselo, Douglas, MD    Family History Family History  Problem Relation Age of Onset  . Rheum arthritis Mother   . Renal cancer Sister   . CAD Brother   . Lung cancer Brother     Social History Social History   Tobacco Use  . Smoking status: Never Smoker  . Smokeless tobacco: Never Used  Substance Use Topics  . Alcohol use: No  . Drug use: No     Allergies    Ciprofloxacin   Review of Systems Review of Systems  Constitutional: Positive for appetite change and fever. Negative for chills.  HENT: Negative for congestion.   Eyes: Negative for visual disturbance.  Respiratory: Negative for cough and shortness of breath.   Cardiovascular: Positive for chest pain.  Gastrointestinal: Positive for nausea. Negative for abdominal pain, diarrhea and vomiting.  Genitourinary: Negative for dysuria and hematuria.  Musculoskeletal: Negative for back pain and neck pain.  Skin: Negative for rash.  Neurological: Positive for weakness. Negative for dizziness, numbness and headaches.  Psychiatric/Behavioral: Negative for confusion.  All other systems reviewed and are negative.    Physical Exam Updated Vital Signs BP (!) 167/65   Pulse 60   Temp 98.4 F (36.9 C) (Oral)   Resp 15   Ht 4'  10" (1.473 m)   Wt 45.4 kg (100 lb)   SpO2 100%   BMI 20.90 kg/m   Physical Exam  Constitutional: She is oriented to person, place, and time. She appears well-developed and well-nourished.  Frail and elderly appearing.   HENT:  Head: Normocephalic and atraumatic.  Mouth/Throat: Oropharynx is clear and moist and mucous membranes are normal.  Eyes: Conjunctivae, EOM and lids are normal. Pupils are equal, round, and reactive to light.  Neck: Full passive range of motion without pain.  Cardiovascular: Normal rate, regular rhythm, normal heart sounds and normal pulses. Exam reveals no friction rub.  Pulses:      Radial pulses are 2+ on the right side, and 2+ on the left side.       Dorsalis pedis pulses are 2+ on the right side, and 2+ on the left side.  Pulmonary/Chest: Effort normal and breath sounds normal.  No evidence of respiratory distress. Able to speak in full sentences without difficulty.  Abdominal: Soft. Normal appearance. There is tenderness in the epigastric area. There is no rigidity and no guarding.  Musculoskeletal: Normal range of motion.   Neurological: She is alert and oriented to person, place, and time.  Cranial nerves III-XII intact Follows commands, Moves all extremities  5/5 strength to BUE and BLE  Sensation intact throughout all major nerve distributions Normal finger to nose. No dysdiadochokinesia. No pronator drift. No slurred speech. No facial droop.   Skin: Skin is warm and dry. Capillary refill takes less than 2 seconds.  Psychiatric: She has a normal mood and affect. Her speech is normal.  Nursing note and vitals reviewed.    ED Treatments / Results  Labs (all labs ordered are listed, but only abnormal results are displayed) Labs Reviewed  COMPREHENSIVE METABOLIC PANEL - Abnormal; Notable for the following components:      Result Value   ALT 13 (*)    GFR calc non Af Amer 58 (*)    All other components within normal limits  URINALYSIS, ROUTINE W REFLEX MICROSCOPIC - Abnormal; Notable for the following components:   Specific Gravity, Urine 1.004 (*)    Ketones, ur 5 (*)    Leukocytes, UA SMALL (*)    Squamous Epithelial / LPF 0-5 (*)    All other components within normal limits  CBC WITH DIFFERENTIAL/PLATELET  LIPASE, BLOOD  I-STAT TROPONIN, ED    EKG  EKG Interpretation  Date/Time:  Wednesday November 05 2017 21:42:02 EDT Ventricular Rate:  56 PR Interval:    QRS Duration: 87 QT Interval:  495 QTC Calculation: 478 R Axis:   14 Text Interpretation:  Sinus rhythm Confirmed by Tilden Fossa [phone]) on 11/05/2017 9:48:59 PM       Radiology Dg Chest 2 View  Result Date: 11/05/2017 CLINICAL DATA:  Cough and head congestion x 9 days - increasing weakness - hx CHF - hx hypertension= pt has breast implants EXAM: CHEST - 2 VIEW COMPARISON:  06/27/2017 FINDINGS: Lungs are clear. Heart size and mediastinal contours are within normal limits. Aortic Atherosclerosis (ICD10-170.0) No effusion. Peripherally calcified breast implants. Thoracolumbar S shaped scoliosis with multilevel spondylitic changes.  Cholecystectomy clips. IMPRESSION: No acute findings.  Chronic changes as above. Electronically Signed   By: Corlis Leak M.D.   On: 11/05/2017 12:38    Procedures Procedures (including critical care time)  Medications Ordered in ED Medications - No data to display   Initial Impression / Assessment and Plan / ED Course  I have reviewed the  triage vital signs and the nursing notes.  Pertinent labs & imaging results that were available during my care of the patient were reviewed by me and considered in my medical decision making (see chart for details).     84 year old female who presents for evaluation of generalized weakness, fatigue.  She reports that symptoms began after a few days of nasal congestion, rhinorrhea.  Also had a day of cough and diarrhea but states that has since resolved.  Reports that a few days ago, she began developing a "uncomfortable sensation in the midsternal area."  Not worse with exertion, deep inspiration.  No difficulty breathing.  No fevers. Patient is afebrile, non-toxic appearing, sitting comfortably on examination table. Vital signs reviewed and stable.  No neuro deficits noted on exam.  She has very minimal tenderness in epigastric region that I think is related to her midsternal discomfort.  Her abdomen is otherwise nontender, nondistended.  Consider acute infectious etiology versus ACS etiology.  History/physical exam is not concerning for CVA, SBO.  We will plan to check basic labs, EKG, troponin, chest x-ray.   Lipase unremarkable.  CBC without any significant leukocytosis, anemia.  CMP is unremarkable.  UA shows small leukocytes but otherwise negative for any acute abnormality.  Chest x-ray is negative for any acute infectious etiology.  EKG is document above.  Troponin is negative.  I discussed with Dr. Madilyn Hook who independently evaluated patient.  Repeat exam shows patient is nontender in the epigastric region.  No other tenderness to the abdomen.  Given that  patient symptoms have been ongoing for the last several days, no indication for repeat troponin at this time.  Do not suspect ACS etiology as the basis of patient's pain.  Patient instructed follow-up with her primary care doctor in the next 24-48 hours for further evaluation. Patient had ample opportunity for questions and discussion. All patient's questions were answered with full understanding. Strict return precautions discussed. Patient expresses understanding and agreement to plan.   Final Clinical Impressions(s) / ED Diagnoses   Final diagnoses:  Other fatigue  Cough    ED Discharge Orders    None       Rosana Hoes 11/06/17 0115    Tilden Fossa, MD 11/06/17 1455

## 2017-11-05 NOTE — ED Notes (Addendum)
Pt is alert and orinted x 4 and is verbally responsive. Pt reports that she has had a cough x 9 days with associated weakness, and fatigue. Pt reports that she has had a productive cough with yellow phlegm. Pt reports that she has not been  Coughing today. Pt denies fevers. Pt is escorted with a friend..Marland Kitchen

## 2017-11-05 NOTE — ED Triage Notes (Signed)
Per EMS pt complaint of ongoing cough for 9 days; diarrhea x1.

## 2017-11-05 NOTE — Discharge Instructions (Signed)
Follow-up with your primary care doctor in the next 2-4 days for further evaluation.  Make sure you are eating and drinking appropriately and staying hydrated.  Return to the emergency department for any worsening feelings of weakness, fatigue, chest pain, fever, vomiting, pain in her stomach, or any other worsening or concerning symptoms.

## 2017-11-26 ENCOUNTER — Encounter: Payer: Self-pay | Admitting: Cardiovascular Disease

## 2017-11-26 ENCOUNTER — Ambulatory Visit: Payer: Medicare Other | Admitting: Cardiovascular Disease

## 2017-11-26 VITALS — BP 120/62 | HR 66 | Ht 59.0 in | Wt 105.8 lb

## 2017-11-26 DIAGNOSIS — I340 Nonrheumatic mitral (valve) insufficiency: Secondary | ICD-10-CM | POA: Diagnosis not present

## 2017-11-26 DIAGNOSIS — I350 Nonrheumatic aortic (valve) stenosis: Secondary | ICD-10-CM | POA: Diagnosis not present

## 2017-11-26 NOTE — Patient Instructions (Signed)

## 2017-11-26 NOTE — Progress Notes (Signed)
Cardiology Office Note   Date:  11/26/2017   ID:  Melanie [name], DOB Jul 03, 1924, MRN [id number]  PCP:  Melanie Corn, MD  Cardiologist:   Melanie Miss, MD   Melanie Complaint  Patient presents with  . Follow-up    mitral regurgitation   Problem List 1. Mitral regurgitation  2. Back pains  3. Mild left carotid artery artery disease    History of Present Illness: Melanie [name] is a 84 y.o. female who presents for evaluation of a new heart murmur .  She reports that she is here for a regular check up - no CP or dyspnea.  She used to see Melanie [name]  ( moved to [place], [place])  Brought a record of her BP and HR readings - all look very good.   She has had an echo in Melanie [name] office. Normal LV function . Possible PFO. , trace AI  Mild MR  BP and HR is very well controlled  Nov. 16, 2017:     Melanie [name] is seen back for a follow up visit after a year.  Her husband passed away this past 01/03/23.   Awoke with some heart flutters ,   BP was a little elevated BP is great .  November 26, 2017: Melanie [name] seen back today for follow-up of her mild mitral regurgitation and hypertension. Was seen by Melanie [name] in July for palpitations .   No arrhythmias found  Has been to the ER several times.  - once for a UTI Another for reaction to a med - ? Flu shot   Not so much exercise recently but is active.   Walks outside on occasion Still manages lots of rental houses,  Plays in the Melanie [name] choir.  Has mild AS, mil dMR, mild TR   Past Medical History:  Diagnosis Date  . Bruises easily    on Aspirin;on hold for surgery  . Carotid stenosis   . CHF (congestive heart failure) (HCC)    diastolic dysfunction  . Chronic back pain    stenosis  . Chronic sinusitis   . Diverticulosis   . Esophageal stricture   . GERD (gastroesophageal reflux disease)    takes Protonix daily  . Heart murmur    as a child  . Hemorrhoids   . History of colon polyps   . Hypertension    takes Amlodipine,HCTZ,and  Metoprolol  . Insomnia   . Mitral insufficiency and aortic stenosis   . Palpitations   . Peripheral edema   . Schatzki's ring   . Scoliosis   . Stroke Melanie [name])     Past Surgical History:  Procedure Laterality Date  . BREAST ENHANCEMENT SURGERY    . CHOLECYSTECTOMY    . COLONOSCOPY    . DILATION AND CURETTAGE OF UTERUS    . epidural injections    . ESOPHAGOGASTRODUODENOSCOPY    . EYE SURGERY     left cataract removed  . LUMBAR LAMINECTOMY/DECOMPRESSION MICRODISCECTOMY Left 01/26/2013   Procedure: LUMBAR LAMINECTOMY/DECOMPRESSION MICRODISCECTOMY 1 LEVEL;  Surgeon: Melanie Abu, MD;  Location: [facility];  Service: Neurosurgery;  Laterality: Left;  Left Lumbar four-five Laminectomy/Foraminotomy  . steroid injections     x 3  . TOTAL HIP ARTHROPLASTY Right      Current Outpatient Medications  Medication Sig Dispense Refill  . alendronate (FOSAMAX) 70 MG tablet Take 70 mg by mouth once a week.  11  . Amino Acids (AMINO ACID PO) Take 1 scoop by mouth  daily. *[facility]* Has with 12 oz of water everyday    . amLODipine (NORVASC) 5 MG tablet Take 2.5 mg by mouth 2 (two) times daily.     . Ascorbic Acid (VITAMIN C PO) Take 1 tablet by mouth daily.    . Cholecalciferol (VITAMIN D3) 2000 UNITS TABS Take 1 tablet by mouth daily.    . metoprolol tartrate (LOPRESSOR) 50 MG tablet Take 50 mg by mouth at bedtime.   3  . Multiple Vitamin (MULTIVITAMIN) tablet Take 1 tablet by mouth daily.    . Omega-3 Fatty Acids (FISH OIL PO) Take 2 capsules by mouth daily.    . potassium chloride (KLOR-CON) 20 MEQ packet Take 20 mEq by mouth daily. 30 tablet 0  . pravastatin (PRAVACHOL) 20 MG tablet Take 1 tablet (20 mg total) by mouth daily at 6 PM. 30 tablet 0   No current facility-administered medications for this visit.     Allergies:   Ciprofloxacin    Social History:  The patient  reports that she has never smoked. She has never used smokeless tobacco. She reports that she does not drink alcohol  or use drugs.   Family History:  The patient's family history includes CAD in her brother; Lung cancer in her brother; Renal cancer in her sister; Rheum arthritis in her mother.    ROS:  Please see the history of present illness.      Physical Exam: Blood pressure 120/62, pulse 66, height 4\' 11"  (1.499 m), weight 105 lb 12.8 oz (48 kg), SpO2 96 %.  GEN:  Well nourished, well developed in no acute distress HEENT: Normal NECK: No JVD; No carotid bruits LYMPHATICS: No lymphadenopathy CARDIAC: RR,  Soft systolic murmur  RESPIRATORY:  Clear to auscultation without rales, wheezing or rhonchi  ABDOMEN: Soft, non-tender, non-distended MUSCULOSKELETAL:  No edema; No deformity  SKIN: Warm and dry NEUROLOGIC:  Alert and oriented x 3   EKG:     Recent Labs: 11/05/2017: ALT 13; BUN 9; Creatinine, Ser 0.85; Hemoglobin 14.2; Platelets 221; Potassium 4.7; Sodium 139    Lipid Panel    Component Value Date/Time   CHOL 181 07/18/2016 0253   TRIG 93 07/18/2016 0253   HDL 56 07/18/2016 0253   CHOLHDL 3.2 07/18/2016 0253   VLDL 19 07/18/2016 0253   LDLCALC 106 (H) 07/18/2016 0253      Wt Readings from Last 3 Encounters:  11/26/17 105 lb 12.8 oz (48 kg)  11/05/17 100 lb (45.4 kg)  06/28/17 117 lb 4.6 oz (53.2 kg)      Other studies Reviewed: Additional studies/ records that were reviewed today include: . Review of the above records demonstrates:    ASSESSMENT AND PLAN: . 1. Mitral regurgitation  - very stable , mild MR by echo. No further follow up needed   2. Back pains    3. Mild left carotid artery artery disease  4.  Stress/depression. His regular still getting over the death of her husband. She explained the mechanism of his death. He apparently fell and entangled in his oxygen tubing. His head was pulled down and he was not able to breathe. She is a fairly small lady and he was a large man and she was not able to pull his head up in order to allow him to breathe. She  called EMS.  He took them some time to free him from the oxygen cord and get him into a position where they could provide CPR. They were successful in restarting  his heart. He was brought to the emergency room where he was found have stable cardiac and Pulmozyme status but had severe anoxic brain injury. He lived for 2 more days.  Shamariah brought a form for an accident insurance policy that I filled out. I did not see him during that hospitalization but she provided me the records. Dr. Marita Snellen saw him during that hospitalization   Current medicines are reviewed at length with the patient today.  The patient does not have concerns regarding medicines.  The following changes have been made:  no change  Labs/ tests ordered today include:  No orders of the defined types were placed in this encounter.         Melanie Miss, MD  11/26/2017 4:04 PM    [facility] [address] [place], [place], [place]  [address] Phone: [phone]; Fax: [phone]

## 2018-03-06 ENCOUNTER — Other Ambulatory Visit: Payer: Self-pay | Admitting: Internal Medicine

## 2018-03-06 DIAGNOSIS — Z1231 Encounter for screening mammogram for malignant neoplasm of breast: Secondary | ICD-10-CM

## 2018-05-11 ENCOUNTER — Ambulatory Visit: Payer: Medicare Other | Attending: Neurological Surgery | Admitting: Physical Therapy

## 2018-05-11 ENCOUNTER — Encounter: Payer: Self-pay | Admitting: Physical Therapy

## 2018-05-11 DIAGNOSIS — M542 Cervicalgia: Secondary | ICD-10-CM | POA: Diagnosis present

## 2018-05-11 DIAGNOSIS — M6281 Muscle weakness (generalized): Secondary | ICD-10-CM | POA: Diagnosis present

## 2018-05-11 DIAGNOSIS — R293 Abnormal posture: Secondary | ICD-10-CM | POA: Diagnosis not present

## 2018-05-11 DIAGNOSIS — R29898 Other symptoms and signs involving the musculoskeletal system: Secondary | ICD-10-CM | POA: Diagnosis present
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
## 2018-05-20 ENCOUNTER — Ambulatory Visit: Payer: Medicare Other | Admitting: Physical Therapy

## 2018-05-20 DIAGNOSIS — R293 Abnormal posture: Secondary | ICD-10-CM

## 2018-05-20 DIAGNOSIS — M6281 Muscle weakness (generalized): Secondary | ICD-10-CM

## 2018-05-20 DIAGNOSIS — M542 Cervicalgia: Secondary | ICD-10-CM

## 2018-05-20 DIAGNOSIS — R29898 Other symptoms and signs involving the musculoskeletal system: Secondary | ICD-10-CM
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
## 2018-05-20 NOTE — Patient Instructions (Signed)
Decompression series issued from exercise drawer All exercises issued Daily 5 x 5 seconds each Decompression ( 1st exercise ) to be held 5 to 15 minutes

## 2018-05-26 ENCOUNTER — Ambulatory Visit: Payer: Medicare Other | Attending: Neurological Surgery | Admitting: Physical Therapy

## 2018-05-26 ENCOUNTER — Encounter: Payer: Self-pay | Admitting: Physical Therapy

## 2018-05-26 DIAGNOSIS — M542 Cervicalgia: Secondary | ICD-10-CM | POA: Diagnosis present

## 2018-05-26 DIAGNOSIS — M6281 Muscle weakness (generalized): Secondary | ICD-10-CM | POA: Diagnosis present

## 2018-05-26 DIAGNOSIS — R293 Abnormal posture: Secondary | ICD-10-CM | POA: Diagnosis present

## 2018-05-26 DIAGNOSIS — R29898 Other symptoms and signs involving the musculoskeletal system: Secondary | ICD-10-CM | POA: Diagnosis present

## 2018-05-26 NOTE — Patient Instructions (Signed)
Over Head Pull: Narrow Grip     Perform 1-2 times a week.    On back, knees bent, feet flat, band across thighs, elbows straight but relaxed. Pull hands apart (start). Keeping elbows straight, bring arms up and over head, hands toward floor. Keep pull steady on band. Hold momentarily. Return slowly, keeping pull steady, back to start. Repeat _15-20__ times. Band color ___red___   Side Pull: Double Arm   On back, knees bent, feet flat. Arms perpendicular to body, shoulder level, elbows straight but relaxed. Pull arms out to sides, elbows straight. Resistance band comes across collarbones, hands toward floor. Hold momentarily. Slowly return to starting position. Repeat _15-20__ times. Band color _red____   Melanie [name]   On back, knees bent, feet flat, left hand on left hip, right hand above left. Pull right arm DIAGONALLY (hip to shoulder) across chest. Bring right arm along head toward floor. Hold momentarily. Slowly return to starting position. Repeat _15-20__ times. Do with left arm. Band color __red___   Shoulder Rotation: Double Arm   On back, knees bent, feet flat, elbows tucked at sides, bent 90, hands palms up. Pull hands apart and down toward floor, keeping elbows near sides. Hold momentarily. Slowly return to starting position. Repeat _15-20__ times. Band color ___red__   [facility]  Walking is a great form of exercise to increase your strength, endurance and overall fitness.  A walking program can help you start slowly and gradually build endurance as you go.  Everyone's ability is different, so each person's starting point will be different.  You do not have to follow them exactly.  The are just samples. You should simply find out what's right for you and stick to that program.   In the beginning, you'll start off walking 2-3 times a day for short distances.  As you get stronger, you'll be walking further at just 1-2 times per day.  A. You Can Walk For A Certain Length Of Time Each  Day    Walk 5 minutes 3 times per day.  Increase 2 minutes every 2 days (3 times per day).  Work up to 25-30 minutes (1-2 times per day).   Example:   Day 1-2 5 minutes 3 times per day   Day 7-8 12 minutes 2-3 times per day   Day 13-14 25 minutes 1-2 times per day  B. You Can Walk For a Certain Distance Each Day     Distance can be substituted for time.    Example:   3 trips to mailbox (at road)   3 trips to corner of block   3 trips around the block
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
## 2018-06-03 ENCOUNTER — Ambulatory Visit: Payer: Medicare Other | Admitting: Physical Therapy

## 2018-06-03 ENCOUNTER — Encounter: Payer: Self-pay | Admitting: Physical Therapy

## 2018-06-03 DIAGNOSIS — R293 Abnormal posture: Secondary | ICD-10-CM

## 2018-06-03 DIAGNOSIS — M6281 Muscle weakness (generalized): Secondary | ICD-10-CM

## 2018-06-03 DIAGNOSIS — M542 Cervicalgia: Secondary | ICD-10-CM

## 2018-06-03 DIAGNOSIS — R29898 Other symptoms and signs involving the musculoskeletal system: Secondary | ICD-10-CM

## 2018-06-03 NOTE — Patient Instructions (Signed)
In kitchen at counter with chairs near [employer]  with these exercises  1. Walk forward 2. Walk backward 3. March forward 4. Side step each way 5.Tip toe walking 6. Heel walking    3  to 4  Times a week  Theses should take about 15 minutes

## 2018-06-03 NOTE — Therapy (Signed)
[facility] [address] [place], [place], [address] Phone: [phone]   Fax:  [phone]  Physical Therapy Treatment  Patient Details  Name: Melanie [name] MRN: [id number] Date of Birth: [birth year] Referring Provider (PT): Dr Barnett Abu   Encounter Date: 06/03/2018  PT End of Session - 06/03/18 1832    Visit Number  4    Number of Visits  6    Date for PT Re-Evaluation  06/22/18    PT Start Time  1330    PT Stop Time  1417    PT Time Calculation (min)  47 min    Activity Tolerance  Patient tolerated treatment well    Behavior During Therapy  [facility] for tasks assessed/performed       Past Medical History:  Diagnosis Date  . Bruises easily    on Aspirin;on hold for surgery  . Carotid stenosis   . CHF (congestive heart failure) (HCC)    diastolic dysfunction  . Chronic back pain    stenosis  . Chronic sinusitis   . Diverticulosis   . Esophageal stricture   . GERD (gastroesophageal reflux disease)    takes Protonix daily  . Heart murmur    as a child  . Hemorrhoids   . History of colon polyps   . Hypertension    takes Amlodipine,HCTZ,and Metoprolol  . Insomnia   . Mitral insufficiency and aortic stenosis   . Palpitations   . Peripheral edema   . Schatzki's ring   . Scoliosis   . Stroke [facility])     Past Surgical History:  Procedure Laterality Date  . BREAST ENHANCEMENT SURGERY    . CHOLECYSTECTOMY    . COLONOSCOPY    . DILATION AND CURETTAGE OF UTERUS    . epidural injections    . ESOPHAGOGASTRODUODENOSCOPY    . EYE SURGERY     left cataract removed  . LUMBAR LAMINECTOMY/DECOMPRESSION MICRODISCECTOMY Left 01/26/2013   Procedure: LUMBAR LAMINECTOMY/DECOMPRESSION MICRODISCECTOMY 1 LEVEL;  Surgeon: Barnett Abu, MD;  Location: [facility];  Service: Neurosurgery;  Laterality: Left;  Left Lumbar four-five Laminectomy/Foraminotomy  . steroid injections     x 3  . TOTAL HIP ARTHROPLASTY Right     There  were no vitals filed for this visit.  Subjective Assessment - 06/03/18 1405    Subjective  has had bad cramps in leg, left last 2 nights.  Recently ran out of paotassium and this may cause cramps.  I keep busy often and get the exercises done 3-4 x a week.      Currently in Pain?  Yes    Pain Score  --   .5/10   Pain Location  Neck    Pain Orientation  Left    Pain Descriptors / Indicators  Sore   slight pain   Pain Frequency  Intermittent    Aggravating Factors     some exercise    Pain Relieving Factors  keeping good posture    Multiple Pain Sites  --   left leg cramps                      [facility] Adult PT Treatment/Exercise - 06/03/18 0001      High Level Balance   High Level Balance Comments  exercises practiced for osteoprorsis in standing.  Thes also were challanging with balance due tobeing dynamic.  Close SBA initially with these.  Walking forward, reverse, side to side,  marching, tip toe  walking and heel walking.       Shoulder Exercises: Supine   Other Supine Exercises  supine scapular stabilization.  red band,  moderate cues      4 exercises  each,  mild pain left shoulder,  better cues            PT Education - 06/03/18 1402    Education Details  HEP    Person(s) Educated  Patient    Methods  Explanation;Demonstration;Tactile cues;Verbal cues;Handout    Comprehension  Verbalized understanding;Returned demonstration          PT Long Term Goals - 06/03/18 1835      PT LONG TERM GOAL #1   Title  i with safe HEP for osteoporosis for core and bone growth.     Baseline  continue to build HEP for this    Time  6    Period  Weeks    Status  On-going      PT LONG TERM GOAL #2   Title  improve [employer] posture screen =/> 85/100    Time  6    Period  Weeks    Status  On-going      PT LONG TERM GOAL #3   Title  report decreased pain in Lt cervical region to her prior level before pool incident     Baseline  no neck pain today    Time  6     Period  Weeks    Status  Achieved      PT LONG TERM GOAL #4   Title  improve FOTO =/< 42% limited     Time  6    Period  Weeks    Status  Unable to assess      PT LONG TERM GOAL #5   Title  pt will report feeling confident in her walking per her previous level     Baseline  working on this with exercise    Time  6    Period  Weeks    Status  On-going            Plan - 06/03/18 1833    Clinical Impression Statement  Patient was able to progress HEP to help prevent bone loss.  Since they were challanging they should also help with her balance.  Mild brief pain with shoulder exercise today .  This improved with cue for technique.     PT Home Exercise Plan  Decompression series, Bridge,  supine scapular stabilization.  Standing at counter  walking forward, back, side, march, tip toe, heel     Consulted and Agree with Plan of Care  Patient       Patient will benefit from skilled therapeutic intervention in order to improve the following deficits and impairments:     Visit Diagnosis: Abnormal posture  Cervicalgia  Muscle weakness (generalized)  Other symptoms and signs involving the musculoskeletal system     Problem List Patient Active Problem List   Diagnosis Date Noted  . Palpitations   . Acute CVA (cerebrovascular accident) (HCC) 07/18/2016  . TIA (transient ischemic attack)   . Hyperlipidemia   . Cerebrovascular accident (CVA) due to embolism of left middle cerebral artery (HCC)   . 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 Essential hypertension 07/17/2016  . Hypokalemia 07/17/2016  . Mitral regurgitation 02/24/2015  . Spinal stenosis, lumbar region, with neurogenic claudication 01/27/2013  . Lumbar scoliosis 01/27/2013    Melanie [name] PTA 06/03/2018, 6:37 PM  [place]  [facility] [address] [place], [place], [address] Phone: [phone]   Fax:  [phone]  Name: Melanie [name] MRN:  [id number] Date of Birth: 1924/02/05

## 2018-06-10 ENCOUNTER — Encounter: Payer: Self-pay | Admitting: Physical Therapy

## 2018-06-10 ENCOUNTER — Ambulatory Visit: Payer: Medicare Other | Admitting: Physical Therapy

## 2018-06-10 DIAGNOSIS — M6281 Muscle weakness (generalized): Secondary | ICD-10-CM

## 2018-06-10 DIAGNOSIS — R293 Abnormal posture: Secondary | ICD-10-CM

## 2018-06-10 DIAGNOSIS — M542 Cervicalgia: Secondary | ICD-10-CM

## 2018-06-10 DIAGNOSIS — R29898 Other symptoms and signs involving the musculoskeletal system: Secondary | ICD-10-CM

## 2018-06-10 NOTE — Therapy (Signed)
[facility] [address] [place], [place], [address] Phone: [phone]   Fax:  [phone]  Physical Therapy Treatment  Patient Details  Name: Melanie [name] MRN: [id number] Date of Birth: [birth year] Referring Provider (PT): Dr Barnett Abu   Encounter Date: 06/10/2018  PT End of Session - 06/10/18 1434    Visit Number  5    Number of Visits  6    Date for PT Re-Evaluation  06/22/18    PT Start Time  1335    PT Stop Time  1417    PT Time Calculation (min)  42 min    Activity Tolerance  Patient tolerated treatment well    Behavior During Therapy  [facility] for tasks assessed/performed       Past Medical History:  Diagnosis Date  . Bruises easily    on Aspirin;on hold for surgery  . Carotid stenosis   . CHF (congestive heart failure) (HCC)    diastolic dysfunction  . Chronic back pain    stenosis  . Chronic sinusitis   . Diverticulosis   . Esophageal stricture   . GERD (gastroesophageal reflux disease)    takes Protonix daily  . Heart murmur    as a child  . Hemorrhoids   . History of colon polyps   . Hypertension    takes Amlodipine,HCTZ,and Metoprolol  . Insomnia   . Mitral insufficiency and aortic stenosis   . Palpitations   . Peripheral edema   . Schatzki's ring   . Scoliosis   . Stroke [facility])     Past Surgical History:  Procedure Laterality Date  . BREAST ENHANCEMENT SURGERY    . CHOLECYSTECTOMY    . COLONOSCOPY    . DILATION AND CURETTAGE OF UTERUS    . epidural injections    . ESOPHAGOGASTRODUODENOSCOPY    . EYE SURGERY     left cataract removed  . LUMBAR LAMINECTOMY/DECOMPRESSION MICRODISCECTOMY Left 01/26/2013   Procedure: LUMBAR LAMINECTOMY/DECOMPRESSION MICRODISCECTOMY 1 LEVEL;  Surgeon: Barnett Abu, MD;  Location: [facility];  Service: Neurosurgery;  Laterality: Left;  Left Lumbar four-five Laminectomy/Foraminotomy  . steroid injections     x 3  . TOTAL HIP ARTHROPLASTY Right     There  were no vitals filed for this visit.  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    Currently in Pain?  Yes    Pain Score  5     Pain Location  Neck    Pain Orientation  Left    Pain Descriptors / Indicators  --   Pain   Pain Radiating Towards  into shoulder     Aggravating Factors   result of working 5 hours     Multiple Pain Sites  --   has right heas side pain comes and goes at temple.  Has had in years past.  Not lately.                      [facility] Adult PT Treatment/Exercise - 06/10/18 0001      Shoulder Exercises: Supine   Other Supine Exercises  shoulder press X3  head press X 1 increased pain      Manual Therapy   Soft tissue mobilization  upper trap, neck,  peri scapulat  multiple tender areas noted , somw softened.  Able to decrease pain to 2/10  Lt neck  Neck Exercises: Stretches   Levator Stretch  2 reps;10 seconds   both   Other Neck Stretches  total trunk rotation 5 x each way, min cues  to increase visual field for driving without increasing neck pain              PT Education - 06/10/18 1433    Education Details  turn trunk vs neck only to look when you are driving.    Person(s) Educated  Patient    Methods  Explanation;Demonstration;Verbal cues    Comprehension  Verbalized understanding;Returned demonstration          PT Long Term Goals - 06/10/18 1337      PT LONG TERM GOAL #1   Title  i with safe HEP for osteoporosis for core and bone growth.     Baseline  minor cues    Time  6    Period  Weeks    Status  On-going      PT LONG TERM GOAL #2   Title  improve [employer] posture screen =/> 85/100    Time  6    Period  Weeks    Status  Unable to assess      PT LONG TERM GOAL #3   Title  report decreased pain in Lt cervical region to her prior level before pool incident     Baseline  achieved earlier .  5/10 pain today.     Time  6    Period  Weeks    Status   Achieved      PT LONG TERM GOAL #4   Title  improve FOTO =/< 42% limited     Time  6    Period  Weeks    Status  Unable to assess      PT LONG TERM GOAL #5   Title  pt will report feeling confident in her walking per her previous level     Baseline  more confident    Time  6    Period  Weeks    Status  On-going            Plan - 06/10/18 1435    Clinical Impression Statement  Pain flare with neck pain today.  Able to decrease neck pain to 2/10.  Few exercises done due to pain increase.  Patient was able to increase the visual field( with less pain) for driving with whole trunk rotation.      PT Next Visit Plan  review standing lower body ex if patient id feeling up to exercise    PT Home Exercise Plan  Decompression series, Bridge,  supine scapular stabilization.  Standing at counter  walking forward, back, side, march, tip toe, heel     Consulted and Agree with Plan of Care  Patient       Patient will benefit from skilled therapeutic intervention in order to improve the following deficits and impairments:     Visit Diagnosis: Abnormal posture  Cervicalgia  Muscle weakness (generalized)  Other symptoms and signs involving the musculoskeletal system     Problem List Patient Active Problem List   Diagnosis Date Noted  . Palpitations   . Acute CVA (cerebrovascular accident) (HCC) 07/18/2016  . TIA (transient ischemic attack)   . Hyperlipidemia   . Cerebrovascular accident (CVA) due to embolism of left middle cerebral artery (HCC)   . 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 Essential hypertension 07/17/2016  . Hypokalemia 07/17/2016  . Mitral regurgitation  02/24/2015  . Spinal stenosis, lumbar region, with neurogenic claudication 01/27/2013  . Lumbar scoliosis 01/27/2013    Melanie [name]  PTA 06/10/2018, 2:38 PM  [facility] [address] [place], [place], [address] Phone: [phone]   Fax:   [phone]  Name: Melanie [name] MRN: [id number] Date of Birth: Aug 07, 1924

## 2018-06-17 ENCOUNTER — Encounter: Payer: Self-pay | Admitting: Physical Therapy

## 2018-06-17 ENCOUNTER — Ambulatory Visit: Payer: Medicare Other | Admitting: Physical Therapy

## 2018-06-17 DIAGNOSIS — R29898 Other symptoms and signs involving the musculoskeletal system: Secondary | ICD-10-CM

## 2018-06-17 DIAGNOSIS — M542 Cervicalgia: Secondary | ICD-10-CM

## 2018-06-17 DIAGNOSIS — R293 Abnormal posture: Secondary | ICD-10-CM

## 2018-06-17 DIAGNOSIS — M6281 Muscle weakness (generalized): Secondary | ICD-10-CM

## 2018-06-17 NOTE — Therapy (Signed)
[facility] [address] [place], [place], [address] Phone: [phone]   Fax:  [phone]  Physical Therapy Treatment  Patient Details  Name: Melanie [name] MRN: [id number] Date of Birth: Mar 28, 1924 Referring Provider (PT): Dr Barnett Abu   Encounter Date: 06/17/2018  PT End of Session - 06/17/18 1528    Visit Number  6    Number of Visits  6    Date for PT Re-Evaluation  06/22/18    PT Start Time  1331    PT Stop Time  1425    PT Time Calculation (min)  54 min    Activity Tolerance  Patient tolerated treatment well    Behavior During Therapy  [facility] for tasks assessed/performed       Past Medical History:  Diagnosis Date  . Bruises easily    on Aspirin;on hold for surgery  . Carotid stenosis   . CHF (congestive heart failure) (HCC)    diastolic dysfunction  . Chronic back pain    stenosis  . Chronic sinusitis   . Diverticulosis   . Esophageal stricture   . GERD (gastroesophageal reflux disease)    takes Protonix daily  . Heart murmur    as a child  . Hemorrhoids   . History of colon polyps   . Hypertension    takes Amlodipine,HCTZ,and Metoprolol  . Insomnia   . Mitral insufficiency and aortic stenosis   . Palpitations   . Peripheral edema   . Schatzki's ring   . Scoliosis   . Stroke [facility])     Past Surgical History:  Procedure Laterality Date  . BREAST ENHANCEMENT SURGERY    . CHOLECYSTECTOMY    . COLONOSCOPY    . DILATION AND CURETTAGE OF UTERUS    . epidural injections    . ESOPHAGOGASTRODUODENOSCOPY    . EYE SURGERY     left cataract removed  . LUMBAR LAMINECTOMY/DECOMPRESSION MICRODISCECTOMY Left 01/26/2013   Procedure: LUMBAR LAMINECTOMY/DECOMPRESSION MICRODISCECTOMY 1 LEVEL;  Surgeon: Barnett Abu, MD;  Location: [facility];  Service: Neurosurgery;  Laterality: Left;  Left Lumbar four-five Laminectomy/Foraminotomy  . steroid injections     x 3  . TOTAL HIP ARTHROPLASTY Right     There  were no vitals filed for this visit.  Subjective Assessment - 06/17/18 1336    Subjective  I worked at [employer] and I did too much  2-3 /10left neck.  i am not yet to baseline with  neck pain and walking.  it should get better with exercises. Patient has been having memory difficulties since the pool incident.  (MD is aware)   Currently in Pain?  Yes    Pain Score  3     Pain Location  Neck    Pain Orientation  Left    Pain Descriptors / Indicators  --   pressure   Pain Radiating Towards  into back of ear.      Pain Frequency  Intermittent    Aggravating Factors   turning head to left  hurts     Pain Relieving Factors  good posture,  hot shower                       [facility] Adult PT Treatment/Exercise - 06/17/18 0001      High Level Balance   High Level Balance Comments  toe tap to  8 inch step  toe walking, heel walking ,  high march,  ,  walking forward and reverse  side steps     tandem static and dynamic  challanging.  Close SBA to occasional CGA as needed for safety.    2/10 hip pain  with movement     Neck Exercises: Supine   Other Supine Exercise  decompression series ,  all reviewed practiced   2 x each,  decompression position stretch x 3 minutes.        Knee/Hip Exercises: Standing   Heel Raises  10 reps    Heel Raises Limitations  also toe raise      Moist Heat Therapy   Number Minutes Moist Heat  10 Minutes    Moist Heat Location  Cervical   sitting, with 4 inch step under feet and pillow under arms.     Manual Therapy   Manual therapy comments  upper thoracis and upper trap neck soft tissue work sitting  tissue softened and ROM improved mildly painful with rotation to the left.      Soft tissue mobilization  Patient noted tingling into leg and toes with light paraspinal soft tissue work upper thoracic.       Ankle Exercises: Standing   Rocker Board  2 minutes    Rocker Board Limitations  pulls hamstrings RT> LT             PT  Education - 06/17/18 1527    Education Details  Exercise cues minor  with HEP    Person(s) Educated  Patient    Methods  Explanation;Tactile cues;Verbal cues;Demonstration    Comprehension  Returned demonstration;Verbalized understanding          PT Long Term Goals - 06/17/18 1535      PT LONG TERM GOAL #1   Title  i with safe HEP for osteoporosis for core and bone growth.     Baseline  minor cues    Time  6    Period  Weeks    Status  On-going      PT LONG TERM GOAL #2   Title  improve [employer] posture screen =/> 85/100    Time  6    Period  Weeks    Status  Unable to assess      PT LONG TERM GOAL #3   Title  report decreased pain in Lt cervical region to her prior level before pool incident     Baseline  not back to baseline,continues to improve.    Time  6    Period  Weeks    Status  On-going      PT LONG TERM GOAL #4   Title  improve FOTO =/< 42% limited     Time  6    Period  Weeks    Status  Unable to assess      PT LONG TERM GOAL #5   Title  pt will report feeling confident in her walking per her previous level     Baseline  more confident,  not back to baseline    Time  6    Period  Weeks    Status  On-going            Plan - 06/17/18 1528    Clinical Impression Statement  Neck pain improving since last session.  HEP practiced and reviewed in part with patient requiring minor cues.  i did not yet review supins scapular stabilization or bridge .  She has improved the  walking and neck pain increase (from pool incident)  however she is not quite back to baseline..  She says she is going to continue to work on these things at home..  She is not always compliant with HEP,  she is always busy.     PT Next Visit Plan  [employer]  posture screen, FOTO review bridge and supine scapular stabilization exetcises.  One more visit scheduled.      PT Home Exercise Plan  Decompression series, Bridge,  supine scapular stabilization.  Standing at counter  walking forward,  back, side, march, tip toe, heel     Consulted and Agree with Plan of Care  Patient       Patient will benefit from skilled therapeutic intervention in order to improve the following deficits and impairments:     Visit Diagnosis: Abnormal posture  Cervicalgia  Muscle weakness (generalized)  Other symptoms and signs involving the musculoskeletal system     Problem List Patient Active Problem List   Diagnosis Date Noted  . Palpitations   . Acute CVA (cerebrovascular accident) (HCC) 07/18/2016  . TIA (transient ischemic attack)   . Hyperlipidemia   . Cerebrovascular accident (CVA) due to embolism of left middle cerebral artery (HCC)   . 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 Essential hypertension 07/17/2016  . Hypokalemia 07/17/2016  . Mitral regurgitation 02/24/2015  . Spinal stenosis, lumbar region, with neurogenic claudication 01/27/2013  . Lumbar scoliosis 01/27/2013    Melanie [name]  PTA 06/17/2018, 3:38 PM  [facility] [address] [place], [place], [address] Phone: [phone]   Fax:  [phone]  Name: Melanie [name] MRN: [id number] Date of Birth: 07/10/1924

## 2018-06-24 ENCOUNTER — Encounter: Payer: Self-pay | Admitting: Physical Therapy

## 2018-06-24 ENCOUNTER — Ambulatory Visit: Payer: Medicare Other | Admitting: Physical Therapy

## 2018-06-24 DIAGNOSIS — M6281 Muscle weakness (generalized): Secondary | ICD-10-CM

## 2018-06-24 DIAGNOSIS — M542 Cervicalgia: Secondary | ICD-10-CM

## 2018-06-24 DIAGNOSIS — R293 Abnormal posture: Secondary | ICD-10-CM | POA: Diagnosis not present

## 2018-06-24 DIAGNOSIS — R29898 Other symptoms and signs involving the musculoskeletal system: Secondary | ICD-10-CM

## 2018-06-24 NOTE — Addendum Note (Signed)
Addended by: Dessie Coma on: 06/24/2018 03:44 PM   Modules accepted: Orders

## 2018-06-24 NOTE — Therapy (Addendum)
[place], [place], [address] Phone: [phone]   Fax:  [phone]  Physical Therapy Treatment/ Discharge   Patient Details  Name: Melanie [name] MRN: [id number] Date of Birth: 06-Jul-1924 Referring Provider (PT): Dr Kristeen Miss   Encounter Date: 06/24/2018  PT End of Session - 06/24/18 1349    Visit Number  7    Number of Visits  7    Date for PT Re-Evaluation  07/01/18    PT Start Time  1332    PT Stop Time  1410    PT Time Calculation (min)  38 min    Activity Tolerance  Patient tolerated treatment well    Behavior During Therapy  [facility] for tasks assessed/performed       Past Medical History:  Diagnosis Date  . Bruises easily    on Aspirin;on hold for surgery  . Carotid stenosis   . CHF (congestive heart failure) (HCC)    diastolic dysfunction  . Chronic back pain    stenosis  . Chronic sinusitis   . Diverticulosis   . Esophageal stricture   . GERD (gastroesophageal reflux disease)    takes Protonix daily  . Heart murmur    as a child  . Hemorrhoids   . History of colon polyps   . Hypertension    takes Amlodipine,HCTZ,and Metoprolol  . Insomnia   . Mitral insufficiency and aortic stenosis   . Palpitations   . Peripheral edema   . Schatzki's ring   . Scoliosis   . Stroke [facility])     Past Surgical History:  Procedure Laterality Date  . BREAST ENHANCEMENT SURGERY    . CHOLECYSTECTOMY    . COLONOSCOPY    . DILATION AND CURETTAGE OF UTERUS    . epidural injections    . ESOPHAGOGASTRODUODENOSCOPY    . EYE SURGERY     left cataract removed  . LUMBAR LAMINECTOMY/DECOMPRESSION MICRODISCECTOMY Left 01/26/2013   Procedure: LUMBAR LAMINECTOMY/DECOMPRESSION MICRODISCECTOMY 1 LEVEL;  Surgeon: Kristeen Miss, MD;  Location: [place] [facility];  Service: Neurosurgery;  Laterality: Left;  Left Lumbar four-five Laminectomy/Foraminotomy  . steroid injections     x 3  . TOTAL HIP ARTHROPLASTY Right      There were no vitals filed for this visit.  Subjective Assessment - 06/24/18 1338    Subjective  Patient feels like her neck and thoracic spine are about the same. Her thoracic spine is now giving hersharp pain that is causing her difficulty standing up straight. She dosent feel like any of the exercidses have really helped. Sometimes she gfets sore with the exercises.     Limitations  Standing    Patient Stated Goals  keep neck from slipping any worse and not have her posture change more so she can keep walking.     Currently in Pain?  Yes    Pain Score  2     Pain Location  Neck    Pain Orientation  Left    Pain Descriptors / Indicators  Aching    Pain Type  Chronic pain    Pain Onset  1 to 4 weeks ago    Pain Frequency  Intermittent    Aggravating Factors   tuning head to the left     Pain Relieving Factors  good posture, hot shower     Multiple Pain Sites  Yes   Has had pain in her thoraicc spine that caused her to have difficulty standing but she  has nothing right now.         [facility] PT Assessment - 06/24/18 0001      Palpation   Palpation comment  Tightness in L upper trap and Levator scap> R side                    [facility] Adult PT Treatment/Exercise - 06/24/18 0001      Shoulder Exercises: Supine   Other Supine Exercises  Cervical retraction x8 5sec holds; shoulder press x8 with 5 sec holds; alt. leg lengthener Bil x5 5 sec holds       Manual Therapy   Manual Therapy  Soft tissue mobilization;Myofascial release    Manual therapy comments  trigger point release to Bil Uppe traps; no reports of tingling into legs;  suboccipital release     Soft tissue mobilization  STM to bil upper trap and levator     Myofascial Release  upper trap       Neck Exercises: Stretches   Upper Trapezius Stretch  2 reps;20 seconds   manual stretch to upper trap            PT Education - 06/24/18 1349    Education Details  reviewed final HEP; use of theracane     Person(s) Educated  Patient    Methods  Explanation;Demonstration;Verbal cues;Tactile cues    Comprehension  Verbalized understanding;Returned demonstration          PT Long Term Goals - 06/24/18 1352      PT LONG TERM GOAL #1   Title  i with safe HEP for osteoporosis for core and bone growth.     Baseline  Patient is independent with HEP     Time  6    Period  Weeks    Status  On-going      PT LONG TERM GOAL #2   Title  improve REEDCO posture screen =/> 85/100    Baseline  No assessed     Time  6    Period  Weeks    Status  Unable to assess      PT LONG TERM GOAL #3   Title  report decreased pain in Lt cervical region to her prior level before pool incident     Baseline  Patient reports she continues to have pain     Time  6    Period  Weeks    Status  Not Met      PT LONG TERM GOAL #4   Title  improve FOTO =/< 42% limited     Baseline  57% limited     Time  6    Period  Weeks    Status  On-going      PT LONG TERM GOAL #5   Title  pt will report feeling confident in her walking per her previous level     Baseline  more confident,  not back to baseline    Time  6    Period  Weeks    Status  On-going            Plan - 06/24/18 1350    Clinical Impression Statement  Patient feels she has reached her max potential for PT. She doesnt feel like it is helping too much. She is still having pain in her neck and is beggining to have sharp pain in her thoracic spine. She had a decrease inher FOTO score.     Clinical Presentation  Stable  Clinical Decision Making  Low    Rehab Potential  Good    PT Frequency  1x / week    PT Duration  6 weeks    PT Treatment/Interventions  Dry needling;Neuromuscular re-education;Manual techniques;Moist Heat;Ultrasound;Cryotherapy;Electrical Stimulation;Balance training;Passive range of motion;Therapeutic exercise;Patient/family education;Taping    PT Next Visit Plan  RE[employer]  posture screen, FOTO review bridge and supine scapular  stabilization exetcises.  One more visit scheduled.      PT Home Exercise Plan  Decompression series, Bridge,  supine scapular stabilization.  Standing at counter  walking forward, back, side, march, tip toe, heel     Consulted and Agree with Plan of Care  Patient       Patient will benefit from skilled therapeutic intervention in order to improve the following deficits and impairments:  Abnormal gait, Pain, Postural dysfunction, Increased muscle spasms, Decreased strength, Impaired UE functional use, Difficulty walking  Visit Diagnosis: Abnormal posture  Cervicalgia  Muscle weakness (generalized)  Other symptoms and signs involving the musculoskeletal system    PHYSICAL THERAPY DISCHARGE SUMMARY  Visits from Start of Care: 5  Current functional level related to goals / functional outcomes: Some improvement with neck pain at times     Remaining deficits: Continued sharp pain in her thoracic spine    Education / Equipment: HEP   Plan: Patient agrees to discharge.  Patient goals were partially met. Patient is being discharged due to being pleased with the current functional level.  ?????      Problem List Patient Active Problem List   Diagnosis Date Noted  . Palpitations   . Acute CVA (cerebrovascular accident) ([place]) 07/18/2016  . TIA (transient ischemic attack)   . Hyperlipidemia   . Cerebrovascular accident (CVA) due to embolism of left middle cerebral artery ([place])   . 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 Essential hypertension 07/17/2016  . Hypokalemia 07/17/2016  . Mitral regurgitation 02/24/2015  . Spinal stenosis, lumbar region, with neurogenic claudication 01/27/2013  . Lumbar scoliosis 01/27/2013    Carney Living PT DPT  06/24/2018, 3:35 PM   Einar Crow SPT  06/24/2018   During this treatment session, the therapist was present, participating in and directing the treatment.    [place], [place], [address] Phone: [phone]   Fax:  [phone]  Name: Melanie [name] MRN: [id number] Date of Birth: [birth year]

## 2018-08-31 ENCOUNTER — Encounter: Payer: Self-pay | Admitting: Physical Therapy

## 2018-08-31 ENCOUNTER — Ambulatory Visit: Payer: Medicare Other | Attending: Neurological Surgery | Admitting: Physical Therapy

## 2018-08-31 DIAGNOSIS — M62838 Other muscle spasm: Secondary | ICD-10-CM

## 2018-08-31 DIAGNOSIS — M6281 Muscle weakness (generalized): Secondary | ICD-10-CM | POA: Diagnosis present

## 2018-08-31 DIAGNOSIS — R293 Abnormal posture: Secondary | ICD-10-CM | POA: Diagnosis present

## 2018-08-31 DIAGNOSIS — R29898 Other symptoms and signs involving the musculoskeletal system: Secondary | ICD-10-CM | POA: Diagnosis present

## 2018-08-31 DIAGNOSIS — M542 Cervicalgia: Secondary | ICD-10-CM

## 2018-09-01 ENCOUNTER — Encounter: Payer: Self-pay | Admitting: Physical Therapy

## 2018-09-01 NOTE — Therapy (Signed)
[facility] [address] [place], [place], [address] Phone: [phone]   Fax:  [phone]  Physical Therapy Evaluation  Patient Details  Name: Melanie [name] MRN: [id number] Date of Birth: 11/25/1923 Referring Provider (PT): Dr Ninfa MeekerHarry [name]    Encounter Date: 08/31/2018  PT End of Session - 09/01/18 0816    Visit Number  1    Number of Visits  12    Date for PT Re-Evaluation  10/13/18    Authorization Type  Progress note at 10 visits.     PT Start Time  1500    PT Stop Time  1543    PT Time Calculation (min)  43 min    Activity Tolerance  Patient tolerated treatment well    Behavior During Therapy  WFL for tasks assessed/performed       Past Medical History:  Diagnosis Date  . Bruises easily    on Aspirin;on hold for surgery  . Carotid stenosis   . CHF (congestive heart failure) (HCC)    diastolic dysfunction  . Chronic back pain    stenosis  . Chronic sinusitis   . Diverticulosis   . Esophageal stricture   . GERD (gastroesophageal reflux disease)    takes Protonix daily  . Heart murmur    as a child  . Hemorrhoids   . History of colon polyps   . Hypertension    takes Amlodipine,HCTZ,and Metoprolol  . Insomnia   . Mitral insufficiency and aortic stenosis   . Palpitations   . Peripheral edema   . Schatzki's ring   . Scoliosis   . Stroke [facility](HCC)     Past Surgical History:  Procedure Laterality Date  . BREAST ENHANCEMENT SURGERY    . CHOLECYSTECTOMY    . COLONOSCOPY    . DILATION AND CURETTAGE OF UTERUS    . epidural injections    . ESOPHAGOGASTRODUODENOSCOPY    . EYE SURGERY     left cataract removed  . LUMBAR LAMINECTOMY/DECOMPRESSION MICRODISCECTOMY Left 01/26/2013   Procedure: LUMBAR LAMINECTOMY/DECOMPRESSION MICRODISCECTOMY 1 LEVEL;  Surgeon: Barnett AbuHenry [name], MD;  Location: [facility];  Service: Neurosurgery;  Laterality: Left;  Left Lumbar four-five Laminectomy/Foraminotomy  . steroid injections      x 3  . TOTAL HIP ARTHROPLASTY Right     There were no vitals filed for this visit.   Subjective Assessment - 08/31/18 1510    Subjective  Patient reports pain in her neck that has been going on for some time. The pain has been radiating into her left arm stopping at her elbow but is now progressingfurther. She is also having pain into her right arm, Her pain can radiate into her ear and her eye. The patient is the worst when she getsout of bed in the morning. She does not have much " sharp pain".      Limitations  Standing    Patient Stated Goals  keep neck from slipping any worse and not have her posture change more so she can keep walking.     Currently in Pain?  Yes    Pain Score  2     Pain Location  Back    Pain Orientation  Right    Pain Descriptors / Indicators  Aching    Pain Type  Chronic pain    Pain Onset  1 to 4 weeks ago    Pain Frequency  Constant    Aggravating Factors   turning her head  Pain Relieving Factors  good posture; hot shower     Effect of Pain on Daily Activities  difficulty sitting for a long period of time.     Multiple Pain Sites  Yes    Pain Score  2    Pain Location  Neck    Pain Orientation  Left;Right    Pain Descriptors / Indicators  Aching    Pain Type  Chronic pain    Pain Onset  More than a month ago    Pain Frequency  Constant    Aggravating Factors   standing and walking     Pain Relieving Factors  getting up and moving    Effect of Pain on Daily Activities  difficulty perfroming ADL's          [facility] PT Assessment - 09/01/18 0001      Assessment   Medical Diagnosis  Cerval and Low Back Pain    Referring Provider (PT)  Dr Ninfa Meeker     Onset Date/Surgical Date  --   Increased pain in September of 2019    Hand Dominance  Right    Next MD Visit  none     Prior Therapy  October 2019       Precautions   Precautions  None      Restrictions   Weight Bearing Restrictions  No      Balance Screen   Has the patient fallen in  the past 6 months  No    Has the patient had a decrease in activity level because of a fear of falling?   No    Is the patient reluctant to leave their home because of a fear of falling?   No      Home Public house manager residence    Living Arrangements  Alone    Home Access  Stairs to enter      Prior Function   Level of Independence  Independent    Vocation  Retired    Leisure  play handbells, church activities      Cognition   Overall Cognitive Status  Within Functional Limits for tasks assessed    Attention  Focused    Focused Attention  Appears intact    Memory  Appears intact    Awareness  Appears intact    Problem Solving  Appears intact    Executive Function  Reasoning      Observation/Other Assessments   Focus on Therapeutic Outcomes (FOTO)   FOTO taken but web site not fucntional at this time for results       Sensation   Light Touch  Appears Intact      Coordination   Gross Motor Movements are Fluid and Coordinated  Yes    Fine Motor Movements are Fluid and Coordinated  Yes      ROM / Strength   AROM / PROM / Strength  PROM      AROM   AROM Assessment Site  Lumbar    Cervical Flexion  40    Cervical Extension  34    Cervical - Right Rotation  70    Cervical - Left Rotation  72    Lumbar Flexion  limited 25%     Lumbar Extension  no limit     Lumbar - Right Side Bend  no limit     Lumbar - Left Side Bend  no limit     Lumbar - Right Rotation  no  limit     Lumbar - Left Rotation  no limit       PROM   Overall PROM Comments  no limitation in PROM of her hips       Strength   Overall Strength Comments  gross UE/LE strength WN:L     Right Shoulder Flexion  4+/5    Right Shoulder Extension  4+/5    Right Shoulder ABduction  4+/5    Right Shoulder Internal Rotation  4+/5    Right/Left Hip  Right;Left    Right Hip Flexion  3+/5    Left Hip Flexion  3+/5      Palpation   Spinal mobility  not assessed. Pateint unabelt ot  compfrtably roll on her stomach    Palpation comment  spasming of bilateral upper traps and left quadratus       Ambulation/Gait   Gait Comments  mild trunk flexion with gait.                 Objective measurements completed on examination: See above findings.      [facility] Adult PT Treatment/Exercise - 09/01/18 0001      Manual Therapy   Manual Therapy  Manual Traction    Manual therapy comments  trigger point release to Bil Uppe traps;     Manual Traction  manual cervical traction              PT Education - 09/01/18 0811    Education Details  reviewed benefits and risks of traction;     Person(s) Educated  Patient    Methods  Explanation;Demonstration;Tactile cues;Verbal cues    Comprehension  Verbalized understanding;Returned demonstration;Verbal cues required;Tactile cues required       PT Short Term Goals - 09/01/18 1344      PT SHORT TERM GOAL #1   Title  Patient will report no pain radiating into her bilateral upper extremitys    Time  4    Period  Weeks    Status  New    Target Date  09/29/18      PT SHORT TERM GOAL #2   Title  Patient will report <2/10 pain in her left lumbar spine    Time  4    Period  Weeks    Status  New    Target Date  09/29/18      PT SHORT TERM GOAL #3   Title  Patient will be independent with basic HEP     Time  4    Period  Weeks    Status  New    Target Date  09/29/18        PT Long Term Goals - 09/01/18 1514      PT LONG TERM GOAL #1   Title  Patient will sleep through the night without increased pain     Time  6    Period  Weeks    Status  New    Target Date  10/13/18      PT LONG TERM GOAL #2   Title  Patient will demosntrate full cerivcal flexion and extension without pain in order to read and look up     Time  6    Period  Weeks    Status  New    Target Date  10/13/18      PT LONG TERM GOAL #3   Title  Patient will sit with improved posture without cuing.     Time  6  Period  Weeks     Status  New    Target Date  10/13/18             Plan - 09/01/18 0820    Clinical Impression Statement  Patient is a 85 year old female with cervical pain that radiates down into both arms L >R . She also is having pain in her left flank. She reports this pain is chronic but is currently exacerbated. She has done therapy in the past but admits she needs to be more consitent with her exercises and her stretches. She has good range of motion for her age. She does have several areas of mysofacial tightness and tenderness to palpation. She would benefit from skilled therapy form myofacial tecnhiques and postrual correction. MD requested a trial of cervical traction for her radiucalr symptoms.     History and Personal Factors relevant to plan of care:  Stroke, Scoliosis, DDD;     Clinical Presentation  Evolving    Clinical Presentation due to:  Pain in mulitple body parts; radicualr pain that is spreading.     Clinical Decision Making  Moderate    Rehab Potential  Fair    Clinical Impairments Affecting Rehab Potential  age and past conservative therapy     PT Frequency  1x / week    PT Duration  6 weeks    PT Treatment/Interventions  Dry needling;Neuromuscular re-education;Manual techniques;Moist Heat;Ultrasound;Cryotherapy;Electrical Stimulation;Balance training;Passive range of motion;Therapeutic exercise;Patient/family education;Taping;Therapeutic activities;Functional mobility training;Traction    PT Next Visit Plan  consider manual therapy to upper traps cervical , and thoracic spine; consider manual traction and or mechanical traction. That is what the MD sent her for; postrual exercises; upper trap stretching; LE hip stretching; light core stregthening.  other modalities PRN    PT Home Exercise Plan  Decompression series, Bridge,  supine scapular stabilization.  Standing at counter  walking forward, back, side, march, tip toe, heel     Consulted and Agree with Plan of Care  Patient        Patient will benefit from skilled therapeutic intervention in order to improve the following deficits and impairments:  Abnormal gait, Pain, Postural dysfunction, Increased muscle spasms, Decreased strength, Impaired UE functional use, Difficulty walking  Visit Diagnosis: Cervicalgia - Plan: PT plan of care cert/re-cert, CANCELED: PT plan of care cert/re-cert  Abnormal posture - Plan: PT plan of care cert/re-cert, CANCELED: PT plan of care cert/re-cert  Other muscle spasm - Plan: PT plan of care cert/re-cert, CANCELED: PT plan of care cert/re-cert     Problem List Patient Active Problem List   Diagnosis Date Noted  . Palpitations   . Acute CVA (cerebrovascular accident) (HCC) 07/18/2016  . TIA (transient ischemic attack)   . Hyperlipidemia   . Cerebrovascular accident (CVA) due to embolism of left middle cerebral artery (HCC)   . 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 Essential hypertension 07/17/2016  . Hypokalemia 07/17/2016  . Mitral regurgitation 02/24/2015  . Spinal stenosis, lumbar region, with neurogenic claudication 01/27/2013  . Lumbar scoliosis 01/27/2013    Dessie Coma PT DPT  09/01/2018, 3:36 PM  [facility] [address] [place], [place], [address] Phone: [phone]   Fax:  [phone]  Name: Melanie [name] MRN: [id number] Date of Birth: [birth year]

## 2018-09-08 ENCOUNTER — Encounter: Payer: Self-pay | Admitting: Physical Therapy

## 2018-09-08 ENCOUNTER — Ambulatory Visit: Payer: Medicare Other | Admitting: Physical Therapy

## 2018-09-08 DIAGNOSIS — M542 Cervicalgia: Secondary | ICD-10-CM | POA: Diagnosis not present

## 2018-09-08 DIAGNOSIS — M6281 Muscle weakness (generalized): Secondary | ICD-10-CM

## 2018-09-08 DIAGNOSIS — R29898 Other symptoms and signs involving the musculoskeletal system: Secondary | ICD-10-CM

## 2018-09-08 DIAGNOSIS — R293 Abnormal posture: Secondary | ICD-10-CM

## 2018-09-08 DIAGNOSIS — M62838 Other muscle spasm: Secondary | ICD-10-CM

## 2018-09-08 NOTE — Therapy (Signed)
[facility] [address] [place], [place], [address] Phone: [phone]   Fax:  [phone]  Physical Therapy Treatment  Patient Details  Name: Melanie [name] MRN: [id number] Date of Birth: September 28, 1923 Referring Provider (PT): Dr Ninfa Meeker    Encounter Date: 09/08/2018  PT End of Session - 09/08/18 1505    Visit Number  2    Number of Visits  12    Date for PT Re-Evaluation  10/13/18    Authorization Type  Progress note at 10 visits.     PT Start Time  1505    PT Stop Time  1545    PT Time Calculation (min)  40 min    Activity Tolerance  Patient tolerated treatment well       Past Medical History:  Diagnosis Date  . Bruises easily    on Aspirin;on hold for surgery  . Carotid stenosis   . CHF (congestive heart failure) (HCC)    diastolic dysfunction  . Chronic back pain    stenosis  . Chronic sinusitis   . Diverticulosis   . Esophageal stricture   . GERD (gastroesophageal reflux disease)    takes Protonix daily  . Heart murmur    as a child  . Hemorrhoids   . History of colon polyps   . Hypertension    takes Amlodipine,HCTZ,and Metoprolol  . Insomnia   . Mitral insufficiency and aortic stenosis   . Palpitations   . Peripheral edema   . Schatzki's ring   . Scoliosis   . Stroke [facility])     Past Surgical History:  Procedure Laterality Date  . BREAST ENHANCEMENT SURGERY    . CHOLECYSTECTOMY    . COLONOSCOPY    . DILATION AND CURETTAGE OF UTERUS    . epidural injections    . ESOPHAGOGASTRODUODENOSCOPY    . EYE SURGERY     left cataract removed  . LUMBAR LAMINECTOMY/DECOMPRESSION MICRODISCECTOMY Left 01/26/2013   Procedure: LUMBAR LAMINECTOMY/DECOMPRESSION MICRODISCECTOMY 1 LEVEL;  Surgeon: Barnett Abu, MD;  Location: [facility];  Service: Neurosurgery;  Laterality: Left;  Left Lumbar four-five Laminectomy/Foraminotomy  . steroid injections     x 3  . TOTAL HIP ARTHROPLASTY Right     There were no  vitals filed for this visit.  Subjective Assessment - 09/08/18 1507    Subjective  Pt reports she feels wobbly today and not that great overall.     Currently in Pain?  Yes    Pain Score  2     Pain Location  Thoracic    Pain Orientation  Left    Pain Descriptors / Indicators  Aching    Pain Type  Chronic pain    Pain Onset  1 to 4 weeks ago    Pain Frequency  Constant                       [facility] Adult PT Treatment/Exercise - 09/08/18 0001      Self-Care   Other Self-Care Comments   discussed the importance of performing her HEP to assist with healing and then maitenence after she is done with therapy      Exercises   Exercises  Neck;Lumbar      Neck Exercises: Standing   Other Standing Exercises  leaning over green physioball, T's, the upper body lifts with hands behind body      Neck Exercises: Supine   Other Supine Exercise  10x 5sec holds thoracic  lifts - vc for form - pt with difficulty initally performing      Neck Exercises: Sidelying   Other Sidelying Exercise  10 reps each upper body rotation with  horizontal abduction      Lumbar Exercises: Supine   Single Leg Bridge  10 reps   each side      Manual Therapy   Manual Therapy  Soft tissue mobilization;Joint mobilization;Manual Traction    Manual therapy comments  explained to patient that mechanical traction is not advised with dx of osteoporosis, we will continue with gentle manual traction    Joint Mobilization  grade II cervical mobs for rotation in supine    Soft tissue mobilization  STM at base of occiput, cervical paraspinals.     Manual Traction  manual cervical traction       Neck Exercises: Stretches   Other Neck Stretches  hooklying over rolled up bath towel under scapula, arms at sides, T and Y                PT Short Term Goals - 09/01/18 1344      PT SHORT TERM GOAL #1   Title  Patient will report no pain radiating into her bilateral upper extremitys    Time  4    Period   Weeks    Status  New    Target Date  09/29/18      PT SHORT TERM GOAL #2   Title  Patient will report <2/10 pain in her left lumbar spine    Time  4    Period  Weeks    Status  New    Target Date  09/29/18      PT SHORT TERM GOAL #3   Title  Patient will be independent with basic HEP     Time  4    Period  Weeks    Status  New    Target Date  09/29/18        PT Long Term Goals - 09/01/18 1514      PT LONG TERM GOAL #1   Title  Patient will sleep through the night without increased pain     Time  6    Period  Weeks    Status  New    Target Date  10/13/18      PT LONG TERM GOAL #2   Title  Patient will demosntrate full cerivcal flexion and extension without pain in order to read and look up     Time  6    Period  Weeks    Status  New    Target Date  10/13/18      PT LONG TERM GOAL #3   Title  Patient will sit with improved posture without cuing.     Time  6    Period  Weeks    Status  New    Target Date  10/13/18            Plan - 09/08/18 1549    Clinical Impression Statement  Melanie [name] reports that she knows she has to perform her HEP and we discussed different options to help her remember to do them.  She had no back pain or arm symptoms at the end of todays session.      Rehab Potential  Fair    Clinical Impairments Affecting Rehab Potential  age and past conservative therapy     PT Frequency  1x / week    PT  Duration  6 weeks    PT Treatment/Interventions  Dry needling;Neuromuscular re-education;Manual techniques;Moist Heat;Ultrasound;Cryotherapy;Electrical Stimulation;Balance training;Passive range of motion;Therapeutic exercise;Patient/family education;Taping;Therapeutic activities;Functional mobility training;Traction    PT Next Visit Plan  chest openers, back of the body strengthening.     Consulted and Agree with Plan of Care  Patient       Patient will benefit from skilled therapeutic intervention in order to improve the following deficits and  impairments:  Abnormal gait, Pain, Postural dysfunction, Increased muscle spasms, Decreased strength, Impaired UE functional use, Difficulty walking  Visit Diagnosis: Cervicalgia  Abnormal posture  Other muscle spasm  Muscle weakness (generalized)  Other symptoms and signs involving the musculoskeletal system     Problem List Patient Active Problem List   Diagnosis Date Noted  . Palpitations   . Acute CVA (cerebrovascular accident) (HCC) 07/18/2016  . TIA (transient ischemic attack)   . Hyperlipidemia   . Cerebrovascular accident (CVA) due to embolism of left middle cerebral artery (HCC)   . 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 Essential hypertension 07/17/2016  . Hypokalemia 07/17/2016  . Mitral regurgitation 02/24/2015  . Spinal stenosis, lumbar region, with neurogenic claudication 01/27/2013  . Lumbar scoliosis 01/27/2013    Roderic ScarceSusan Shaver PT  09/08/2018, 3:53 PM  [facility] [address] [place], [place], [address] Phone: [phone]   Fax:  [phone]  Name: Melanie [name] MRN: [id number] Date of Birth: 02/16/1924

## 2018-09-10 ENCOUNTER — Ambulatory Visit: Payer: Medicare Other | Admitting: Physical Therapy

## 2018-09-10 ENCOUNTER — Encounter: Payer: Self-pay | Admitting: Physical Therapy

## 2018-09-10 DIAGNOSIS — M542 Cervicalgia: Secondary | ICD-10-CM

## 2018-09-10 DIAGNOSIS — M6281 Muscle weakness (generalized): Secondary | ICD-10-CM

## 2018-09-10 DIAGNOSIS — M62838 Other muscle spasm: Secondary | ICD-10-CM

## 2018-09-10 DIAGNOSIS — R29898 Other symptoms and signs involving the musculoskeletal system: Secondary | ICD-10-CM

## 2018-09-10 DIAGNOSIS — R293 Abnormal posture: Secondary | ICD-10-CM

## 2018-09-10 NOTE — Therapy (Addendum)
[place], [place], [address] Phone: [phone]   Fax:  [phone]  Physical Therapy Treatment/Discharge  Patient Details  Name: Melanie [name] MRN: [id number] Date of Birth: 14-Apr-1924 Referring Provider (PT): Dr Gillermina Phy    Encounter Date: 09/10/2018  PT End of Session - 09/10/18 1501    Visit Number  3    Number of Visits  12    Date for PT Re-Evaluation  10/13/18    Authorization Type  Progress note at 10 visits.     PT Start Time  1501    PT Stop Time  1542    PT Time Calculation (min)  41 min    Activity Tolerance  Patient tolerated treatment well       Past Medical History:  Diagnosis Date  . Bruises easily    on Aspirin;on hold for surgery  . Carotid stenosis   . CHF (congestive heart failure) (HCC)    diastolic dysfunction  . Chronic back pain    stenosis  . Chronic sinusitis   . Diverticulosis   . Esophageal stricture   . GERD (gastroesophageal reflux disease)    takes Protonix daily  . Heart murmur    as a child  . Hemorrhoids   . History of colon polyps   . Hypertension    takes Amlodipine,HCTZ,and Metoprolol  . Insomnia   . Mitral insufficiency and aortic stenosis   . Palpitations   . Peripheral edema   . Schatzki's ring   . Scoliosis   . Stroke [facility])     Past Surgical History:  Procedure Laterality Date  . BREAST ENHANCEMENT SURGERY    . CHOLECYSTECTOMY    . COLONOSCOPY    . DILATION AND CURETTAGE OF UTERUS    . epidural injections    . ESOPHAGOGASTRODUODENOSCOPY    . EYE SURGERY     left cataract removed  . LUMBAR LAMINECTOMY/DECOMPRESSION MICRODISCECTOMY Left 01/26/2013   Procedure: LUMBAR LAMINECTOMY/DECOMPRESSION MICRODISCECTOMY 1 LEVEL;  Surgeon: Kristeen Miss, MD;  Location: [facility];  Service: Neurosurgery;  Laterality: Left;  Left Lumbar four-five Laminectomy/Foraminotomy  . steroid injections     x 3  . TOTAL HIP ARTHROPLASTY Right     There  were no vitals filed for this visit.                    [facility] Adult PT Treatment/Exercise - 09/10/18 0001      Exercises   Exercises  Neck;Lumbar      Neck Exercises: Standing   Other Standing Exercises  20 reps, red band, scap retraction bilat, then alternating with trunk rotation and drawing the bow     Neck Exercises: Supine   Neck Retraction  20 reps   into ball off EOB,    Neck Retraction Limitations  VC for form - was using her whole body initially    Other Supine Exercise  hooklying over bolster in thoracic spine, overhead reach, elbow press with hands behind head      Lumbar Exercises: Stretches   Double Knee to Chest Stretch  30 seconds    Lower Trunk Rotation  10 seconds      Lumbar Exercises: Supine   Other Supine Lumbar Exercises  3x10 hip extension with legs straight and on bolster      Lumbar Exercises: Sidelying   Clam  Both;15 reps   reverse and regular  PT Short Term Goals - 09/10/18 1537      PT SHORT TERM GOAL #1   Title  Patient will report no pain radiating into her bilateral upper extremitys    Status  On-going      PT SHORT TERM GOAL #2   Title  Patient will report <2/10 pain in her left lumbar spine    Status  On-going      PT SHORT TERM GOAL #3   Title  Patient will be independent with basic HEP     Status  Achieved        PT Long Term Goals - 09/10/18 1537      PT LONG TERM GOAL #1   Title  Patient will sleep through the night without increased pain     Status  On-going      PT LONG TERM GOAL #2   Title  Patient will demosntrate full cerivcal flexion and extension without pain in order to read and look up     Status  On-going      PT LONG TERM GOAL #3   Title  Patient will sit with improved posture without cuing.     Status  On-going      PT LONG TERM GOAL #4   Title  ---------      PT LONG TERM GOAL #5   Title  ----------            Plan - 09/10/18 1543    Clinical Impression  Statement  Melanie [name] had improved upright posture at the end of todays session.  She reported no pain after the exercise.  She is responding well to thoracic extension work and back of the body strengthening.     Rehab Potential  Fair    Clinical Impairments Affecting Rehab Potential  age and past conservative therapy     PT Frequency  2x / week    PT Duration  6 weeks    PT Treatment/Interventions  Dry needling;Neuromuscular re-education;Manual techniques;Moist Heat;Ultrasound;Cryotherapy;Electrical Stimulation;Balance training;Passive range of motion;Therapeutic exercise;Patient/family education;Taping;Therapeutic activities;Functional mobility training;Traction    PT Next Visit Plan  chest openers, back of the body strengthening.     Consulted and Agree with Plan of Care  Patient       Patient will benefit from skilled therapeutic intervention in order to improve the following deficits and impairments:  Abnormal gait, Pain, Postural dysfunction, Increased muscle spasms, Decreased strength, Impaired UE functional use, Difficulty walking  Visit Diagnosis: Cervicalgia  Abnormal posture  Other muscle spasm  Muscle weakness (generalized)  Other symptoms and signs involving the musculoskeletal system     Problem List Patient Active Problem List   Diagnosis Date Noted  . Palpitations   . Acute CVA (cerebrovascular accident) ([place]) 07/18/2016  . TIA (transient ischemic attack)   . Hyperlipidemia   . Cerebrovascular accident (CVA) due to embolism of left middle cerebral artery ([place])   . 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 Essential hypertension 07/17/2016  . Hypokalemia 07/17/2016  . Mitral regurgitation 02/24/2015  . Spinal stenosis, lumbar region, with neurogenic claudication 01/27/2013  . Lumbar scoliosis 01/27/2013    Jeral Pinch PT  09/10/2018, 3:44 PM  [facility] [address] [place], [place],  [address] Phone: [phone]   Fax:  [phone]  Name: Melanie [name] MRN: [id number] Date of Birth: 1923-08-31   PHYSICAL THERAPY DISCHARGE SUMMARY  Visits from Start of Care:3  Current functional level related to goals / functional outcomes:  unknown   Remaining deficits: unknown   Education / Equipment: HEP Plan:                                                    Patient goals were not met. Patient is being discharged due to not returning since the last visit.  ?????     Jeral Pinch, PT 10/28/18 11:27 AM

## 2018-09-14 ENCOUNTER — Emergency Department (HOSPITAL_COMMUNITY)
Admission: EM | Admit: 2018-09-14 | Discharge: 2018-09-15 | Disposition: A | Discharge status: Home / Self Care | Payer: Medicare Other | Attending: Emergency Medicine | Admitting: Emergency Medicine

## 2018-09-14 ENCOUNTER — Emergency Department (HOSPITAL_COMMUNITY): Payer: Medicare Other

## 2018-09-14 ENCOUNTER — Other Ambulatory Visit: Payer: Self-pay

## 2018-09-14 ENCOUNTER — Ambulatory Visit: Payer: Medicare Other | Admitting: Physical Therapy

## 2018-09-14 DIAGNOSIS — I110 Hypertensive heart disease with heart failure: Secondary | ICD-10-CM | POA: Diagnosis not present

## 2018-09-14 DIAGNOSIS — I509 Heart failure, unspecified: Secondary | ICD-10-CM | POA: Insufficient documentation

## 2018-09-14 DIAGNOSIS — Z79899 Other long term (current) drug therapy: Secondary | ICD-10-CM | POA: Diagnosis not present

## 2018-09-14 DIAGNOSIS — R4182 Altered mental status, unspecified: Secondary | ICD-10-CM | POA: Diagnosis present

## 2018-09-14 DIAGNOSIS — R479 Unspecified speech disturbances: Secondary | ICD-10-CM | POA: Insufficient documentation

## 2018-09-14 LAB — URINALYSIS, ROUTINE W REFLEX MICROSCOPIC
Bilirubin Urine: NEGATIVE
Glucose, UA: NEGATIVE mg/dL
Hgb urine dipstick: NEGATIVE
Ketones, ur: NEGATIVE mg/dL
LEUKOCYTES UA: NEGATIVE
NITRITE: NEGATIVE
Protein, ur: NEGATIVE mg/dL
Specific Gravity, Urine: 1.002 — ABNORMAL LOW (ref 1.005–1.030)
pH: 8 (ref 5.0–8.0)

## 2018-09-14 LAB — COMPREHENSIVE METABOLIC PANEL
ALT: 10 U/L (ref 0–44)
ANION GAP: 11 (ref 5–15)
AST: 19 U/L (ref 15–41)
Albumin: 3.5 g/dL (ref 3.5–5.0)
Alkaline Phosphatase: 73 U/L (ref 38–126)
BUN: 8 mg/dL (ref 8–23)
CO2: 22 mmol/L (ref 22–32)
Calcium: 9.2 mg/dL (ref 8.9–10.3)
Chloride: 106 mmol/L (ref 98–111)
Creatinine, Ser: 0.91 mg/dL (ref 0.44–1.00)
GFR calc Af Amer: >60 mL/min (ref >60)
GFR, EST NON AFRICAN AMERICAN: 55 mL/min — AB (ref >60)
Glucose, Bld: 108 mg/dL — ABNORMAL HIGH (ref 70–99)
Potassium: 3.3 mmol/L — ABNORMAL LOW (ref 3.5–5.1)
Sodium: 139 mmol/L (ref 135–145)
Total Bilirubin: 0.9 mg/dL (ref 0.3–1.2)
Total Protein: 6.4 g/dL — ABNORMAL LOW (ref 6.5–8.1)

## 2018-09-14 LAB — CBC WITH DIFFERENTIAL/PLATELET
Abs Immature Granulocytes: 0.01 10*3/uL (ref 0.00–0.07)
BASOS ABS: 0 10*3/uL (ref 0.0–0.1)
Basophils Relative: 1 %
Eosinophils Absolute: 0.2 10*3/uL (ref 0.0–0.5)
Eosinophils Relative: 3 %
HCT: 41.4 % (ref 36.0–46.0)
Hemoglobin: 13.5 g/dL (ref 12.0–15.0)
IMMATURE GRANULOCYTES: 0 %
Lymphocytes Relative: 26 %
Lymphs Abs: 1.7 10*3/uL (ref 0.7–4.0)
MCH: 30.6 pg (ref 26.0–34.0)
MCHC: 32.6 g/dL (ref 30.0–36.0)
MCV: 93.9 fL (ref 80.0–100.0)
Monocytes Absolute: 0.6 10*3/uL (ref 0.1–1.0)
Monocytes Relative: 9 %
NRBC: 0 % (ref 0.0–0.2)
Neutro Abs: 4 10*3/uL (ref 1.7–7.7)
Neutrophils Relative %: 61 %
Platelets: 207 10*3/uL (ref 150–400)
RBC: 4.41 MIL/uL (ref 3.87–5.11)
RDW: 13.4 % (ref 11.5–15.5)
WBC: 6.5 10*3/uL (ref 4.0–10.5)

## 2018-09-14 LAB — I-STAT CG4 LACTIC ACID, ED: Lactic Acid, Venous: 1.57 mmol/L (ref 0.5–1.9)

## 2018-09-14 LAB — CBG MONITORING, ED: Glucose-Capillary: 106 mg/dL — ABNORMAL HIGH (ref 70–99)

## 2018-09-14 MED ORDER — ACETAMINOPHEN 325 MG PO TABS
650.0000 mg | ORAL_TABLET | Freq: Once | ORAL | Status: AC
  Administered 2018-09-14: 325 mg via ORAL
  Filled 2018-09-14: qty 2

## 2018-09-14 NOTE — ED Notes (Signed)
Pt returns from xray

## 2018-09-14 NOTE — ED Notes (Signed)
Pt preferred to only take 1 325mg  tylenol at this time.  Pt is 2nd in line to be taken to MRI

## 2018-09-14 NOTE — ED Notes (Signed)
Please contact Nadean Corwin [phone] or Lupita Leash [phone] members to take pt home, when ready. Thanks

## 2018-09-14 NOTE — ED Triage Notes (Signed)
Pt from home via EMS, pt's daughter sts she is having speech problems onset 1520 today, "her tongue is thick," is more lethargic, and isn't herself. Daughter denies recent illness of patient. Hx TIA. 4 mg Zofran given PTA.

## 2018-09-14 NOTE — ED Provider Notes (Signed)
[facility] EMERGENCY DEPARTMENT Provider Note   CSN: [id number] Arrival date & time: 09/14/18  1720     History   Chief Complaint Chief Complaint  Patient presents with  . Altered Mental Status    HPI Melanie [name] is a 85 y.o. female.  Patient with history of stroke, congestive heart failure, hypertension --presents to the emergency department today by EMS with complaint of altered mental status.  Per EMS, patient became altered approximately 3 PM.  Per EMS, daughter described thick speech.  This was not present for EMS.  Patient has not had any extremity weakness.  She is disoriented but does not have any facial droop, slurred speech.  EMS documented a fever using a skin laser thermometer of 100.  Zofran given prior to arrival.  No reported recent illness.  Level 5 caveat due to altered mental status.     Past Medical History:  Diagnosis Date  . Bruises easily    on Aspirin;on hold for surgery  . Carotid stenosis   . CHF (congestive heart failure) (HCC)    diastolic dysfunction  . Chronic back pain    stenosis  . Chronic sinusitis   . Diverticulosis   . Esophageal stricture   . GERD (gastroesophageal reflux disease)    takes Protonix daily  . Heart murmur    as a child  . Hemorrhoids   . History of colon polyps   . Hypertension    takes Amlodipine,HCTZ,and Metoprolol  . Insomnia   . Mitral insufficiency and aortic stenosis   . Palpitations   . Peripheral edema   . Schatzki's ring   . Scoliosis   . Stroke [facility])     Patient Active Problem List   Diagnosis Date Noted  . Palpitations   . Acute CVA (cerebrovascular accident) (HCC) 07/18/2016  . TIA (transient ischemic attack)   . Hyperlipidemia   . Cerebrovascular accident (CVA) due to embolism of left middle cerebral artery (HCC)   . 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 Essential hypertension 07/17/2016  . Hypokalemia 07/17/2016  . Mitral regurgitation 02/24/2015  .  Spinal stenosis, lumbar region, with neurogenic claudication 01/27/2013  . Lumbar scoliosis 01/27/2013    Past Surgical History:  Procedure Laterality Date  . BREAST ENHANCEMENT SURGERY    . CHOLECYSTECTOMY    . COLONOSCOPY    . DILATION AND CURETTAGE OF UTERUS    . epidural injections    . ESOPHAGOGASTRODUODENOSCOPY    . EYE SURGERY     left cataract removed  . LUMBAR LAMINECTOMY/DECOMPRESSION MICRODISCECTOMY Left 01/26/2013   Procedure: LUMBAR LAMINECTOMY/DECOMPRESSION MICRODISCECTOMY 1 LEVEL;  Surgeon: Barnett Abu, MD;  Location: [facility];  Service: Neurosurgery;  Laterality: Left;  Left Lumbar four-five Laminectomy/Foraminotomy  . steroid injections     x 3  . TOTAL HIP ARTHROPLASTY Right      OB History   No obstetric history on file.      Home Medications    Prior to Admission medications   Medication Sig Start Date End Date Taking? Authorizing Provider  alendronate (FOSAMAX) 70 MG tablet Take 70 mg by mouth once a week. 09/04/17   [provider]  Amino Acids (AMINO ACID PO) Take 1 scoop by mouth daily. *[facility]* Has with 12 oz of water everyday    [provider]  amLODipine (NORVASC) 5 MG tablet Take 2.5 mg by mouth 2 (two) times daily.     [provider]  Ascorbic Acid (VITAMIN C PO)  Take 1 tablet by mouth daily.    [provider]  Cholecalciferol (VITAMIN D3) 2000 UNITS TABS Take 1 tablet by mouth daily.    [provider]  metoprolol tartrate (LOPRESSOR) 50 MG tablet Take 50 mg by mouth at bedtime.  04/12/17   [provider]  Multiple Vitamin (MULTIVITAMIN) tablet Take 1 tablet by mouth daily.    [provider]  Omega-3 Fatty Acids (FISH OIL PO) Take 2 capsules by mouth daily.    [provider]  potassium chloride (KLOR-CON) 20 MEQ packet Take 20 mEq by mouth daily. 07/19/16   Zannie Cove, MD  pravastatin (PRAVACHOL) 20 MG tablet Take 1 tablet (20 mg total) by mouth daily at 6  PM. Patient not taking: Reported on 08/31/2018 07/19/16   Zannie Cove, MD    Family History Family History  Problem Relation Age of Onset  . Rheum arthritis Mother   . Renal cancer Sister   . CAD Brother   . Lung cancer Brother     Social History Social History   Tobacco Use  . Smoking status: Never Smoker  . Smokeless tobacco: Never Used  Substance Use Topics  . Alcohol use: No  . Drug use: No     Allergies   Ciprofloxacin   Review of Systems Review of Systems  Constitutional: Negative for fever.  HENT: Negative for rhinorrhea and sore throat.   Eyes: Negative for redness.  Respiratory: Negative for cough.   Cardiovascular: Negative for chest pain.  Gastrointestinal: Negative for abdominal pain, diarrhea, nausea and vomiting.  Genitourinary: Negative for dysuria.  Musculoskeletal: Negative for myalgias.  Skin: Negative for rash.  Neurological: Positive for speech difficulty. Negative for light-headedness and headaches.  Psychiatric/Behavioral: Positive for confusion.     Physical Exam Updated Vital Signs BP (!) 127/94 (BP Location: Right Arm)   Pulse 74   Temp 97.9 F (36.6 C) (Oral)   Resp 20   Ht 5\' 2"  (1.575 m)   Wt 49.9 kg   SpO2 100%   BMI 20.12 kg/m   Physical Exam Vitals signs and nursing note reviewed.  Constitutional:      Appearance: She is well-developed.  HENT:     Head: Normocephalic and atraumatic.     Right Ear: Tympanic membrane, ear canal and external ear normal.     Left Ear: Tympanic membrane, ear canal and external ear normal.     Nose: Nose normal.     Mouth/Throat:     Pharynx: Uvula midline.  Eyes:     General: Lids are normal.     Extraocular Movements:     Right eye: No nystagmus.     Left eye: No nystagmus.     Conjunctiva/sclera: Conjunctivae normal.     Pupils: Pupils are equal, round, and reactive to light.  Neck:     Musculoskeletal: Normal range of motion and neck supple.  Cardiovascular:     Rate and  Rhythm: Normal rate and regular rhythm.  Pulmonary:     Effort: Pulmonary effort is normal.     Breath sounds: Normal breath sounds.  Abdominal:     Palpations: Abdomen is soft.     Tenderness: There is no abdominal tenderness.  Musculoskeletal:     Cervical back: She exhibits normal range of motion, no tenderness and no bony tenderness.  Skin:    General: Skin is warm and dry.  Neurological:     Mental Status: She is alert. She is confused.  GCS: GCS eye subscore is 4. GCS verbal subscore is 5. GCS motor subscore is 6.     Cranial Nerves: No cranial nerve deficit.     Sensory: No sensory deficit.     Motor: No weakness or pronator drift.     Coordination: Coordination normal.     Deep Tendon Reflexes: Reflexes are normal and symmetric.     Comments: Neg pronator drift. No focal upper or lower extremity weakness. She followed commands. No cranial nerve deficit. Her speech is clear but confused.       ED Treatments / Results  Labs (all labs ordered are listed, but only abnormal results are displayed) Labs Reviewed  COMPREHENSIVE METABOLIC PANEL - Abnormal; Notable for the following components:      Result Value   Potassium 3.3 (*)    Glucose, Bld 108 (*)    Total Protein 6.4 (*)    GFR calc non Af Amer 55 (*)    All other components within normal limits  URINALYSIS, ROUTINE W REFLEX MICROSCOPIC - Abnormal; Notable for the following components:   Color, Urine COLORLESS (*)    Specific Gravity, Urine 1.002 (*)    All other components within normal limits  CBG MONITORING, ED - Abnormal; Notable for the following components:   Glucose-Capillary 106 (*)    All other components within normal limits  CULTURE, BLOOD (ROUTINE X 2)  CULTURE, BLOOD (ROUTINE X 2)  URINE CULTURE  CBC WITH DIFFERENTIAL/PLATELET  I-STAT CG4 LACTIC ACID, ED  I-STAT CG4 LACTIC ACID, ED    EKG EKG Interpretation  Date/Time:  Monday September 14 2018 17:29:21 EST Ventricular Rate:  72 PR  Interval:    QRS Duration: 87 QT Interval:  439 QTC Calculation: 481 R Axis:   8 Text Interpretation:  Sinus rhythm LAE, consider biatrial enlargement Confirmed by Kristine Royal [phone]) on 09/14/2018 5:45:15 PM   Radiology Dg Chest 2 View  Result Date: 09/14/2018 CLINICAL DATA:  Lethargy and abdominal pain for 1 day. EXAM: CHEST - 2 VIEW COMPARISON:  PA and lateral chest 11/05/2017 and 06/08/2017. FINDINGS: The lungs are clear. Heart size is normal. Aortic atherosclerosis is noted. No pneumothorax or pleural effusion. No acute or focal bony abnormality. Scoliosis noted. IMPRESSION: No acute disease. Atherosclerosis. Electronically Signed   By: Drusilla Kanner M.D.   On: 09/14/2018 18:03    Procedures Procedures (including critical care time)  Medications Ordered in ED Medications  acetaminophen (TYLENOL) tablet 650 mg (325 mg Oral Given 09/14/18 2119)     Initial Impression / Assessment and Plan / ED Course  I have reviewed the triage vital signs and the nursing notes.  Pertinent labs & imaging results that were available during my care of the patient were reviewed by me and considered in my medical decision making (see chart for details).     Patient seen and examined on arrival. Spoke with EMS. Dr. Rodena Medin has also seen. No focal weakness. She is disorientated but can answer basic questions and follow commands. She cannot tell me where she is and cannot tell me her birthday.  Speech is not slurred and there is no facial weakness.  No code stroke at this time as there are no focal neuro deficits and no clear expressive aphasia.   Vital signs reviewed and are as follows: BP (!) 127/94 (BP Location: Right Arm)   Pulse 74   Temp 97.9 F (36.6 C) (Rectal)   Resp 20   Ht 5\' 2"  (1.575 m)  Wt 49.9 kg   SpO2 100%   BMI 20.12 kg/m   5:54 PM Patient in x-ray. Normal rectal temperature.   6:13 PM I went to recheck patient after x-ray. She reiterates that she feels like she is  having trouble talking, but can tell me this in full sentences without any hesitancy or word substitutions. We also had a conversation about her daughter Lupita LeashDonna. Right now she is alert, orientated to person and place and time. Awaiting CT.   7:19 PM Head CT reviewed. No obvious bleeding.   12:02 AM patient continuing to pend MRI to rule out ischemic stroke.  [place] PA-C aware at shift change.  Final Clinical Impressions(s) / ED Diagnoses   Final diagnoses:  Difficulty with speech   Pending completion of work-up.   ED Discharge Orders    None       Renne CriglerGeiple, Jamaree Hosier, Cordelia Poche-C 09/15/18 0003    Wynetta FinesMessick, Peter C, MD 09/15/18 2242

## 2018-09-15 NOTE — Discharge Instructions (Addendum)
Your testing in the ER shows no evidence of acute stroke.  The MRI and CT scans were both negative for acute stroke.  It is unclear what caused your symptoms, but at this time does not appear to be an emergent process.  We recommend that you follow-up with your doctor in 2-3 days.  Return to the ER at anytime for new or worsening symptoms.

## 2018-09-15 NOTE — ED Notes (Signed)
Pt in MRI at this time 

## 2018-09-15 NOTE — ED Notes (Signed)
Family member called for pick up, pt will remain in room on purwik until family arrives

## 2018-09-15 NOTE — ED Provider Notes (Signed)
Patient with self reported difficulty getting words out.  She has not had any difficulty speaking since being in the ER.  CT and MRI are negative for ischemic stroke.  Has had stroke workup in the past.  Is currently neurovascularly intact.  No additional emergency department workup indicated.  Do not feel that patient requires admission.  I believe that she can be safely discharged to home with PCP follow-up.   Roxy Horseman, PA-C 09/15/18 0136    Wynetta Fines, MD 09/15/18 2242

## 2018-09-16 ENCOUNTER — Ambulatory Visit: Payer: Medicare Other | Admitting: Physical Therapy

## 2018-09-16 LAB — URINE CULTURE: Culture: <10000 — AB

## 2018-09-19 LAB — CULTURE, BLOOD (ROUTINE X 2)
Culture: NO GROWTH
Culture: NO GROWTH
Special Requests: ADEQUATE

## 2018-09-21 ENCOUNTER — Encounter: Payer: Medicare Other | Admitting: Physical Therapy

## 2018-09-23 ENCOUNTER — Encounter: Payer: Medicare Other | Admitting: Physical Therapy

## 2018-11-24 ENCOUNTER — Telehealth: Payer: Self-pay

## 2018-11-24 NOTE — Telephone Encounter (Signed)
Left message for pt to call back about appt. 

## 2018-11-26 NOTE — Telephone Encounter (Signed)
Melanie [name] has given consent to have a telephone visit with Dr. Elease Hashimoto on 12/08/2018 at 11am. Melanie [name] has been advised to have BP, HR, and weight ready for appt.      YOUR CARDIOLOGY TEAM HAS ARRANGED FOR AN E-VISIT FOR YOUR APPOINTMENT - PLEASE REVIEW IMPORTANT INFORMATION BELOW SEVERAL DAYS PRIOR TO YOUR APPOINTMENT  Due to the recent COVID-19 pandemic, we are transitioning in-person office visits to tele-medicine visits in an effort to decrease unnecessary exposure to our patients and staff. Medicare and most insurances are covering these visits without a copay needed. You will need a working email and a smartphone or computer with a camera and microphone. For patients that do not have these items, we can still complete the visit using a telephone but do prefer video when possible. If possible, we also ask that you have a blood pressure cuff and scale at home to measure your blood pressure, heart rate and weight prior to your scheduled appointment. Patients with clinical needs that need an in-person evaluation and testing will still be able to come to the office if absolutely necessary. If you have any questions, feel free to call our office.     DOWNLOADING THE SOFTWARE  Download the [employer] app to enable video and telephone visits with your [facility] Provider.   Instructions for downloading [employer] WebEx: - Go to https://www.webex.com/downloads.html and follow the instructions, or download the app on your smartphone [facility] [employer] Meetings). - If you have technical difficulties with downloading WebEx, please call WebEx at [phone]. - Once the app is downloaded (can be done on either mobile or desktop computer), go to Settings in the upper left hand corner.  Be sure that camera and audio are enabled.  - You will receive an email message with a link to the meeting with a time to join for your tele-health visit.  - Please download the app and have settings configured prior to the appointment time.       2-3 DAYS BEFORE YOUR APPOINTMENT  One of our staff will call you to confirm that you have been able to set up your WebEx account. We will remind you check your blood pressure, heart rate and weight prior to your scheduled appointment. If you have an Apple Watch or Kardia, please upload any pertinent ECG strips the day before or morning of your appointment to MyChart. Our staff will also make sure you have reviewed the consent and agree to move forward with your scheduled tele-health visit.    THE DAY OF YOUR APPOINTMENT  Approximately 15-20 minutes prior to your scheduled appointment, you will receive an e-mail directly from one of our staff member's [email or web address] e-mail accounts inviting you to join a WebEx meeting.  Please do not reply to that email - simply join the [employer].  Upon joining, a member of the office staff will speak with you initially through the WebEx platform to confirm medications, vital signs for the day and any symptoms you may be experiencing.  Please have this information available prior to the time of visit start.      CONSENT FOR TELE-HEALTH VISIT - PLEASE RVIEW  I hereby voluntarily request, consent and authorize [facility] and its employed or contracted physicians, physician assistants, nurse practitioners or other licensed health care professionals (the Practitioner), to provide me with telemedicine health care services (the "Services") as deemed necessary by the treating Practitioner. I acknowledge and consent to receive the Services by the Practitioner via telemedicine. I understand that  the telemedicine visit will involve communicating with the Practitioner through live audiovisual communication technology and the disclosure of certain medical information by electronic transmission. I acknowledge that I have been given the opportunity to request an in-person assessment or other available alternative prior to the telemedicine visit and am voluntarily  participating in the telemedicine visit.  I understand that I have the right to withhold or withdraw my consent to the use of telemedicine in the course of my care at any time, without affecting my right to future care or treatment, and that the Practitioner or I may terminate the telemedicine visit at any time. I understand that I have the right to inspect all information obtained and/or recorded in the course of the telemedicine visit and may receive copies of available information for a reasonable fee.  I understand that some of the potential risks of receiving the Services via telemedicine include:  Marland Kitchen Delay or interruption in medical evaluation due to technological equipment failure or disruption; . Information transmitted may not be sufficient (e.g. poor resolution of images) to allow for appropriate medical decision making by the Practitioner; and/or  . In rare instances, security protocols could fail, causing a breach of personal health information.  Furthermore, I acknowledge that it is my responsibility to provide information about my medical history, conditions and care that is complete and accurate to the best of my ability. I acknowledge that Practitioner's advice, recommendations, and/or decision may be based on factors not within their control, such as incomplete or inaccurate data provided by me or distortions of diagnostic images or specimens that may result from electronic transmissions. I understand that the practice of medicine is not an exact science and that Practitioner makes no warranties or guarantees regarding treatment outcomes. I acknowledge that I will receive a copy of this consent concurrently upon execution via email to the email address I last provided but may also request a printed copy by calling the office of [place].    I understand that my insurance will be billed for this visit.   I have read or had this consent read to me. . I understand the contents of this  consent, which adequately explains the benefits and risks of the Services being provided via telemedicine.  . I have been provided ample opportunity to ask questions regarding this consent and the Services and have had my questions answered to my satisfaction. . I give my informed consent for the services to be provided through the use of telemedicine in my medical care  By participating in this telemedicine visit I agree to the above.

## 2018-12-07 ENCOUNTER — Telehealth: Payer: Self-pay | Admitting: Cardiovascular Disease

## 2018-12-08 ENCOUNTER — Encounter: Payer: Self-pay | Admitting: Cardiovascular Disease

## 2018-12-08 ENCOUNTER — Telehealth (INDEPENDENT_AMBULATORY_CARE_PROVIDER_SITE_OTHER): Payer: Medicare Other | Admitting: Cardiovascular Disease

## 2018-12-08 ENCOUNTER — Other Ambulatory Visit: Payer: Self-pay

## 2018-12-08 DIAGNOSIS — I10 Essential (primary) hypertension: Secondary | ICD-10-CM | POA: Diagnosis not present

## 2018-12-08 DIAGNOSIS — Z7189 Other specified counseling: Secondary | ICD-10-CM

## 2018-12-08 NOTE — Progress Notes (Signed)
Virtual Visit via Telephone Note   This visit type was conducted due to national recommendations for restrictions regarding the COVID-19 Pandemic (e.g. social distancing) in an effort to limit this patient's exposure and mitigate transmission in our community.  Due to her co-morbid illnesses, this patient is at least at moderate risk for complications without adequate follow up.  This format is felt to be most appropriate for this patient at this time.  The patient did not have access to video technology/had technical difficulties with video requiring transitioning to audio format only (telephone).  All issues noted in this document were discussed and addressed.  No physical exam could be performed with this format.  Please refer to the patient's chart for her  consent to telehealth for [facility].   Evaluation Performed:  Follow-up visit  Date:  12/08/2018   ID:  Melanie [name], DOB Dec 04, 1923, MRN [id number]  Patient Location: Home  Provider Location: Home  PCP:  Creola Corn, MD  Cardiologist:  Kristeen Miss, MD  Electrophysiologist:  None   Chief Complaint:  Follow up mitral regurgitation, HTN   History of Present Illness:    Melanie [name] is a 85 y.o. female who presents via audio/video conferencing for a telehealth visit today.    Melanie [name] or Melanie [name] is a 85 year old female with a history of hypertension and mild mitral regurgitation.  She remains very active.  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  Her last echocardiogram was November, 2017.  She has normal left ventricular systolic function.  She has grade 1 diastolic dysfunction.  She has mild aortic stenosis with a mean aortic valve gradient of 11 mmHg.  She has mild mitral regurgitation and mild tricuspid regurgitation.  PA pressures remain normal.    Has generally slowed her activities. Has spells of blurry vision and foggy headedness on occasion Generalized  weakness.  Not associated with standing .  May be due to a pinched nerve in her neck    She overall seems to be doing well.  But health is clearly not as good as it was last year.   Had some diarrhea for 2 days - may be related to abx   Staying safe,  Wearing masks Had a fever of 101 ( forehead , at the dentist office Follow up temperature checks looked looked  Did not feels well  Temp at dr. Timothy Lasso was normal.  Possibly due to a UTI     The patient does not have symptoms concerning for COVID-19 infection (fever, chills, cough, or new shortness of breath).    Past Medical History:  Diagnosis Date  . Bruises easily    on Aspirin;on hold for surgery  . Carotid stenosis   . CHF (congestive heart failure) (HCC)    diastolic dysfunction  . Chronic back pain    stenosis  . Chronic sinusitis   . Diverticulosis   . Esophageal stricture   . GERD (gastroesophageal reflux disease)    takes Protonix daily  . Heart murmur    as a child  . Hemorrhoids   . History of colon polyps   . Hypertension    takes Amlodipine,HCTZ,and Metoprolol  . Insomnia   . Mitral insufficiency and aortic stenosis   . Palpitations   . Peripheral edema   . Schatzki's ring   . Scoliosis   . Stroke [facility])    Past Surgical History:  Procedure Laterality Date  .  BREAST ENHANCEMENT SURGERY    . CHOLECYSTECTOMY    . COLONOSCOPY    . DILATION AND CURETTAGE OF UTERUS    . epidural injections    . ESOPHAGOGASTRODUODENOSCOPY    . EYE SURGERY     left cataract removed  . LUMBAR LAMINECTOMY/DECOMPRESSION MICRODISCECTOMY Left 01/26/2013   Procedure: LUMBAR LAMINECTOMY/DECOMPRESSION MICRODISCECTOMY 1 LEVEL;  Surgeon: Barnett AbuHenry [name], MD;  Location: [facility]UR[facility];  Service: Neurosurgery;  Laterality: Left;  Left Lumbar four-five Laminectomy/Foraminotomy  . steroid injections     x 3  . TOTAL HIP ARTHROPLASTY Right      Current Meds  Medication Sig  . amLODipine (NORVASC) 5 MG tablet Take 2.5 mg by mouth 2  (two) times daily.   . Ascorbic Acid (VITAMIN C PO) Take 1 tablet by mouth daily.  . Cholecalciferol (VITAMIN D3) 2000 UNITS TABS Take 2,000 Units by mouth daily.   . metoprolol tartrate (LOPRESSOR) 50 MG tablet Take 50 mg by mouth daily with supper.   . Multiple Vitamin (MULTIVITAMIN WITH MINERALS) TABS tablet Take 1 tablet by mouth daily.  . pantoprazole (PROTONIX) 40 MG tablet Take 40 mg by mouth daily.  . potassium chloride SA (K-DUR,KLOR-CON) 20 MEQ tablet Take 20 mEq by mouth daily.  . Tetrahydrozoline HCl (VISINE OP) Place 1 drop into both eyes daily as needed (dry eyes).     Allergies:   Ciprofloxacin   Social History   Tobacco Use  . Smoking status: Never Smoker  . Smokeless tobacco: Never Used  Substance Use Topics  . Alcohol use: No  . Drug use: No     Family Hx: The patient's family history includes CAD in her brother; Lung cancer in her brother; Renal cancer in her sister; Rheum arthritis in her mother.  ROS:   Please see the history of present illness.     All other systems reviewed and are negative.   Prior CV studies:   The following studies were reviewed today:    Labs/Other Tests and Data Reviewed:    EKG:  No ECG reviewed.  Recent Labs: 09/14/2018: ALT 10; BUN 8; Creatinine, Ser 0.91; Hemoglobin 13.5; Platelets 207; Potassium 3.3; Sodium 139   Recent Lipid Panel Lab Results  Component Value Date/Time   CHOL 181 07/18/2016 02:53 AM   TRIG 93 07/18/2016 02:53 AM   HDL 56 07/18/2016 02:53 AM   CHOLHDL 3.2 07/18/2016 02:53 AM   LDLCALC 106 (H) 07/18/2016 02:53 AM    Wt Readings from Last 3 Encounters:  12/08/18 108 lb (49 kg)  09/14/18 110 lb (49.9 kg)  11/26/17 105 lb 12.8 oz (48 kg)     Objective:    Vital Signs:  BP (!) 135/94   Pulse 62   Ht 5\' 2"  (1.575 m)   Wt 108 lb (49 kg)   BMI 19.75 kg/m    No exam due to telephone   ASSESSMENT & PLAN:    1.  HTN:   BP is generally well controlled.  Continue current meds.     2.    Spells :   She is been having some atypical spells.  The spells are described as of lightheadedness or foggy feeling.  They are not related to standing up and do not seem positional.  She seems to think that it has to do with bending her neck.  Carotid Deep scan several years ago reveals mild to moderate bilateral carotid disease.  At this time I do not think that she needs any additional  work-up.  It sounds like her general health is gradually declining.  I will see her in 6 months.  Given her current health, I do not think that she is a good candidate for carotid endarterectomy so I will be really nonaggressive with rechecking her carotid duplex scan unless things change.  3.  Mitral regurgitation-stable   COVID-19 Education: The signs and symptoms of COVID-19 were discussed with the patient and how to seek care for testing (follow up with PCP or arrange E-visit).  The importance of social distancing was discussed today.  Time:   Today, I have spent 21  minutes with the patient with telehealth technology discussing the above problems.     Medication Adjustments/Labs and Tests Ordered: Current medicines are reviewed at length with the patient today.  Concerns regarding medicines are outlined above.   Tests Ordered: No orders of the defined types were placed in this encounter.   Medication Changes: No orders of the defined types were placed in this encounter.   Disposition:  Follow up in 6 month(s)  Signed, Kristeen Miss, MD  12/08/2018 10:38 AM    [facility]

## 2018-12-08 NOTE — Patient Instructions (Signed)

## 2019-06-30 ENCOUNTER — Encounter: Payer: Self-pay | Admitting: Cardiovascular Disease

## 2019-06-30 ENCOUNTER — Ambulatory Visit (INDEPENDENT_AMBULATORY_CARE_PROVIDER_SITE_OTHER): Payer: Medicare Other | Admitting: Cardiovascular Disease

## 2019-06-30 ENCOUNTER — Encounter (INDEPENDENT_AMBULATORY_CARE_PROVIDER_SITE_OTHER): Payer: Self-pay

## 2019-06-30 ENCOUNTER — Other Ambulatory Visit: Payer: Self-pay

## 2019-06-30 VITALS — BP 144/62 | HR 74 | Ht 59.0 in | Wt 107.4 lb

## 2019-06-30 DIAGNOSIS — I350 Nonrheumatic aortic (valve) stenosis: Secondary | ICD-10-CM | POA: Diagnosis not present

## 2019-06-30 DIAGNOSIS — I340 Nonrheumatic mitral (valve) insufficiency: Secondary | ICD-10-CM

## 2019-06-30 DIAGNOSIS — I10 Essential (primary) hypertension: Secondary | ICD-10-CM | POA: Diagnosis not present

## 2019-06-30 MED ORDER — LOSARTAN POTASSIUM 50 MG PO TABS: 50.0000 mg | ORAL_TABLET | Freq: Every day | ORAL | 3 refills | Status: DC

## 2019-06-30 NOTE — Progress Notes (Signed)
Cardiology Office Note   Date:  06/30/2019   ID:  Melanie [name], DOB 04-Sep-1923, MRN [id number]  PCP:  Shon Baton, MD  Cardiologist:   Mertie Moores, MD   Chief Complaint  Patient presents with  . Mitral Regurgitation   Problem List 1. Mitral regurgitation  2. Back pains  3. Mild left carotid artery artery disease    Previous notes.  Melanie [name] is a 85 y.o. female who presents for evaluation of a new heart murmur .  She reports that she is here for a regular check up - no CP or dyspnea.  She used to see Dr. Minna Antis  ( moved to [place], [place])  [place] a record of her BP and HR readings - all look very good.   She has had an echo in Dr. Mila Palmer office. Normal LV function . Possible PFO. , trace AI  Mild MR  BP and HR is very well controlled  Nov. 16, 2017:     Melanie [name] is seen back for a follow up visit after a year.  Her husband passed away this past 01-08-2023.   Awoke with some heart flutters ,   BP was a little elevated BP is great .  November 26, 2017: Melanie [name] seen back today for follow-up of her mild mitral regurgitation and hypertension. Was seen by [name] in July for palpitations .   No arrhythmias found  Has been to the ER several times.  - once for a UTI Another for reaction to a med - ? Flu shot   Not so much exercise recently but is active.   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  Has mild AS, mil dMR, mild TR   Nov. 4 2020  Has some swelling in her ankles at night. Better by morning.  She has a history of hypertension and I see that she is on amlodipine 2.5 mg twice a day.  She is on potassium chloride but is not on a diuretic.  Her potassium was low at her last BMP   Dr. Virgina Jock has prescribed her the potassium    Past Medical History:  Diagnosis Date  . Bruises easily    on Aspirin;on hold for surgery  . Carotid stenosis   . CHF (congestive heart failure) (HCC)    diastolic dysfunction  .  Chronic back pain    stenosis  . Chronic sinusitis   . Diverticulosis   . Esophageal stricture   . GERD (gastroesophageal reflux disease)    takes Protonix daily  . Heart murmur    as a child  . Hemorrhoids   . History of colon polyps   . Hypertension    takes Amlodipine,HCTZ,and Metoprolol  . Insomnia   . Mitral insufficiency and aortic stenosis   . Palpitations   . Peripheral edema   . Schatzki's ring   . Scoliosis   . Stroke [facility])     Past Surgical History:  Procedure Laterality Date  . BREAST ENHANCEMENT SURGERY    . CHOLECYSTECTOMY    . COLONOSCOPY    . DILATION AND CURETTAGE OF UTERUS    . epidural injections    . ESOPHAGOGASTRODUODENOSCOPY    . EYE SURGERY     left cataract removed  . LUMBAR LAMINECTOMY/DECOMPRESSION MICRODISCECTOMY Left 01/26/2013   Procedure: LUMBAR LAMINECTOMY/DECOMPRESSION MICRODISCECTOMY 1 LEVEL;  Surgeon: Kristeen Miss, MD;  Location: [place] NEURO ORS;  Service: Neurosurgery;  Laterality: Left;  Left Lumbar four-five Laminectomy/Foraminotomy  . steroid injections     x 3  . TOTAL HIP ARTHROPLASTY Right      Current Outpatient Medications  Medication Sig Dispense Refill  . celecoxib (CELEBREX) 200 MG capsule Take 200 mg by mouth daily.    Marland Kitchen. gabapentin (NEURONTIN) 100 MG capsule Take 1 capsule by mouth at bedtime.    . metoprolol tartrate (LOPRESSOR) 50 MG tablet Take 50 mg by mouth daily with supper.   3  . Multiple Vitamin (MULTIVITAMIN WITH MINERALS) TABS tablet Take 1 tablet by mouth daily.    . pantoprazole (PROTONIX) 40 MG tablet Take 40 mg by mouth daily.    . potassium chloride SA (K-DUR,KLOR-CON) 20 MEQ tablet Take 20 mEq by mouth daily.    . Tetrahydrozoline HCl (VISINE OP) Place 1 drop into both eyes daily as needed (dry eyes).    Marland Kitchen. losartan (COZAAR) 50 MG tablet Take 1 tablet (50 mg total) by mouth daily. 90 tablet 3   No current facility-administered medications for this visit.     Allergies:   Ciprofloxacin    Social History:   The patient  reports that she has never smoked. She has never used smokeless tobacco. She reports that she does not drink alcohol or use drugs.   Family History:  The patient's family history includes CAD in her brother; Lung cancer in her brother; Renal cancer in her sister; Rheum arthritis in her mother.    ROS:  Please see the history of present illness.      Physical Exam: Blood pressure (!) 144/62, pulse 74, height 4\' 11"  (1.499 m), weight 107 lb 6.4 oz (48.7 kg), SpO2 98 %.  GEN:  Well nourished, well developed in no acute distress HEENT: Normal NECK: No JVD;  Left carotid bruit vs. Radiation of her systolic murmur  LYMPHATICS: No lymphadenopathy CARDIAC: RRR,  2/6 systololic murmur radiating to upper RSP  RESPIRATORY:  Clear to auscultation without rales, wheezing or rhonchi  ABDOMEN: Soft, non-tender, non-distended MUSCULOSKELETAL:  No edema; No deformity  SKIN: Warm and dry NEUROLOGIC:  Alert and oriented x 3   EKG:     Recent Labs: 09/14/2018: ALT 10; BUN 8; Creatinine, Ser 0.91; Hemoglobin 13.5; Platelets 207; Potassium 3.3; Sodium 139    Lipid Panel    Component Value Date/Time   CHOL 181 07/18/2016 0253   TRIG 93 07/18/2016 0253   HDL 56 07/18/2016 0253   CHOLHDL 3.2 07/18/2016 0253   VLDL 19 07/18/2016 0253   LDLCALC 106 (H) 07/18/2016 0253      Wt Readings from Last 3 Encounters:  06/30/19 107 lb 6.4 oz (48.7 kg)  12/08/18 108 lb (49 kg)  09/14/18 110 lb (49.9 kg)      Other studies Reviewed: Additional studies/ records that were reviewed today include: . Review of the above records demonstrates:    ASSESSMENT AND PLAN: . 1. Mitral regurgitation  - very stable , mild MR by echo. No further follow up needed   2. Back pains    3. Mild left carotid artery artery disease   4.  HTN:   She complains of leg swelling  May be due to amlodpiine. Will DC amlodipine and try Losartan 50 mg a day  BMP in 3 weeks    Alternate plan will be to start  spironolactone  - she also has hypokalemia    Current medicines are reviewed at length with the patient today.  The patient does not have concerns regarding  medicines.  The following changes have been made:  no change  Labs/ tests ordered today include:   Orders Placed This Encounter  Procedures  . Basic Metabolic Panel (BMET)       Kristeen Miss, MD  06/30/2019 5:36 PM    [facility] [address] [place], [place], [place]  [address] Phone: [phone]; Fax: [phone]

## 2019-06-30 NOTE — Patient Instructions (Addendum)
Medication Instructions:  Your physician has recommended you make the following change in your medication: STOP Amlodipine (Norvasc) START Losartan (Cozaar) 50 mg once daily  *If you need a refill on your cardiac medications before your next appointment, please call your pharmacy*   Lab Work: Your physician recommends that you return for lab work in: 3 weeks for basic metabolic panel on Tuesday Nov. 24 You do not have to fast for the appointment.     Testing/Procedures: None Ordered    Follow-Up: At [facility], you and your health needs are our priority.  As part of our continuing mission to provide you with exceptional heart care, we have created designated Provider Care Teams.  These Care Teams include your primary Cardiologist (physician) and Advanced Practice Providers (APPs -  Physician Assistants and Nurse Practitioners) who all work together to provide you with the care you need, when you need it.  Your next appointment:   6 months  The format for your next appointment:   Either In Person or Virtual  Provider:   You may see Mertie Moores, MD or one of the following Advanced Practice Providers on your designated Care Team:    Richardson Dopp, PA-C  [place], [place]  Daune Perch, [place]

## 2019-07-20 ENCOUNTER — Other Ambulatory Visit: Payer: Medicare Other

## 2019-07-20 ENCOUNTER — Other Ambulatory Visit: Payer: Self-pay

## 2019-07-20 DIAGNOSIS — I10 Essential (primary) hypertension: Secondary | ICD-10-CM

## 2019-07-20 DIAGNOSIS — I340 Nonrheumatic mitral (valve) insufficiency: Secondary | ICD-10-CM

## 2019-07-20 DIAGNOSIS — I350 Nonrheumatic aortic (valve) stenosis: Secondary | ICD-10-CM

## 2019-07-21 LAB — BASIC METABOLIC PANEL
BUN/Creatinine Ratio: 8 — ABNORMAL LOW (ref 12–28)
BUN: 8 mg/dL — ABNORMAL LOW (ref 10–36)
CO2: 27 mmol/L (ref 20–29)
Calcium: 9.5 mg/dL (ref 8.7–10.3)
Chloride: 107 mmol/L — ABNORMAL HIGH (ref 96–106)
Creatinine, Ser: 0.98 mg/dL (ref 0.57–1.00)
GFR calc Af Amer: 58 mL/min/{1.73_m2} — ABNORMAL LOW (ref >59)
GFR calc non Af Amer: 50 mL/min/{1.73_m2} — ABNORMAL LOW (ref >59)
Glucose: 109 mg/dL — ABNORMAL HIGH (ref 65–99)
Potassium: 4.5 mmol/L (ref 3.5–5.2)
Sodium: 145 mmol/L — ABNORMAL HIGH (ref 134–144)

## 2019-08-17 ENCOUNTER — Telehealth: Payer: Self-pay | Admitting: Cardiovascular Disease

## 2019-08-17 NOTE — Telephone Encounter (Signed)
Received call directly from operator for patient with high BP. She states she also has headache. States BP readings have been high x 5 days. States she took her losartan 50 mg at 0600 today and takes metoprolol 50 mg at bedtime.  Current BP is 169/96 mmHg, pulse 67 bpm. Reports HR has remained in range of 60's bpm. She has eaten a few frozen cheeseburgers that may be high in sodium but remainder of diet is fruits and vegetables. She states she has not had any water yet today. I advised her to drink 64 oz water daily and she states she is starting now. She has amlodipine 2.5 mg tablets that were discontinued after ov with Dr. Acie Fredrickson on 11/4. She will take amlodipine 2.5 mg and monitor BP in 1 hour. I advised her to take metoprolol and losartan as scheduled tonight and tomorrow morning and measure BP 1 hour later. I advised I will call her tomorrow to check BP readings and with additional medication advice if needed. I advised she may take Tylenol for her headache. She verbalized understanding and agreement with plan and thanked me for the call.

## 2019-08-17 NOTE — Telephone Encounter (Signed)
Patient called back and asked if she was supposed to take amlodipine or amitriptyline. I advised amlodipine and she states she cannot find a prescription bottle at her home. I advised that she may take an additional losartan 50 mg today and follow same plan of checking BP in 1 hour today and checking it again tomorrow morning approximately 1 hour after taking losartan 50 mg. She verbalized understanding and thanked me for the help.

## 2019-08-17 NOTE — Telephone Encounter (Signed)
New Message  Pt c/o BP issue: STAT if pt c/o blurred vision, one-sided weakness or slurred speech  1. What are your last 5 BP readings? 197/94, 166/94, 179/96, 178/94, 169/96  2. Are you having any other symptoms (ex. Dizziness, headache, blurred vision, passed out)? Blurred vision, lack of balance, feeling like she is going to fall, headache, vision issues, unable to focus, eyes water,   3. What is your BP issue? BP elevated, currently 169/96.

## 2019-08-18 MED ORDER — AMLODIPINE BESYLATE 2.5 MG PO TABS: 2.5000 mg | ORAL_TABLET | Freq: Every day | ORAL | 3 refills | Status: DC

## 2019-08-18 NOTE — Telephone Encounter (Signed)
Agree with note by Christen Bame, RN to take amlodipine. Agree that she needs to watch the sodium in her foods.

## 2019-08-18 NOTE — Telephone Encounter (Signed)
Called patient to check on her BP readings today. She reports BP today 171/87 mmHg, pulse 69 at 1000 before her daily medication. She is rechecking BP now and reports 200/100 but she is talking to me while the machine is calibrating. I advised her to sit for awhile and rest and then recheck.  States she was confused about amlodipine Rx because she recognizes the medication by the name Norvasc and took it for many years. I apologized for not stating that yesterday. I advised that I am sending a new Rx for amlodipine 2.5 mg daily. I advised that if BP remains elevated after starting 2.5 mg daily that she may take an additional amlodipine 2.5 mg in the evening. She verbalized understanding and agreement with plan and thanked me for the call.

## 2019-08-24 ENCOUNTER — Telehealth: Payer: Self-pay | Admitting: Cardiovascular Disease

## 2019-08-24 NOTE — Telephone Encounter (Signed)
Spoke with pt, Medication list reviewed- Pt should be taking Losartan 50mg  and Amlodipine 2.5mg  every AM and 50mg  Metoprolol every evening, per recent noted from Dr. Acie Fredrickson, pt can take an extra 2.5mg  amlodipine in the evening if BP still elevated.  Pt just returned home from her daughters house and is currently unable to locate her BP cuff but it was elevated over the weekend.  She then realized that she has not been taking Losartan, she will restart Losartan and continue to monitor.

## 2019-08-24 NOTE — Telephone Encounter (Signed)
Patient would like to speak to nurse about her medications.

## 2019-09-15 ENCOUNTER — Ambulatory Visit: Payer: Medicare Other | Attending: Internal Medicine

## 2019-09-15 DIAGNOSIS — Z23 Encounter for immunization: Secondary | ICD-10-CM | POA: Insufficient documentation

## 2019-09-15 NOTE — Progress Notes (Signed)
   Covid-19 Vaccination Clinic  Name:  Melanie [name]    MRN: [id number] DOB: August 22, 1924  09/15/2019  Melanie [name] was observed post Covid-19 immunization for 15 minutes without incidence. She was provided with Vaccine Information Sheet and instruction to access the V-Safe system.   Melanie [name] was instructed to call 911 with any severe reactions post vaccine: Marland Kitchen Difficulty breathing  . Swelling of your face and throat  . A fast heartbeat  . A bad rash all over your body  . Dizziness and weakness    Immunizations Administered    Name Date Dose VIS Date Route   Pfizer COVID-19 Vaccine 09/15/2019  5:08 PM 0.3 mL 08/06/2019 Intramuscular   Manufacturer: [employer], [employer]   Lot: [id number]   NDC: [address]-8

## 2019-10-06 ENCOUNTER — Ambulatory Visit: Payer: Medicare Other | Attending: Internal Medicine

## 2019-10-06 DIAGNOSIS — Z23 Encounter for immunization: Secondary | ICD-10-CM | POA: Insufficient documentation

## 2019-10-06 NOTE — Progress Notes (Signed)
   Covid-19 Vaccination Clinic  Name:  EVETTA RENNER    MRN: [id number] DOB: 1923-12-24  10/06/2019  Ms. Beumer was observed post Covid-19 immunization for 15 minutes without incidence. She was provided with Vaccine Information Sheet and instruction to access the V-Safe system.   Ms. Arnett was instructed to call 911 with any severe reactions post vaccine: Marland Kitchen Difficulty breathing  . Swelling of your face and throat  . A fast heartbeat  . A bad rash all over your body  . Dizziness and weakness    Immunizations Administered    Name Date Dose VIS Date Route   Pfizer COVID-19 Vaccine 10/06/2019  1:33 PM 0.3 mL 08/06/2019 Intramuscular   Manufacturer: [employer], [employer]   Lot: [id number]   NDC: [address]-3

## 2019-10-19 ENCOUNTER — Telehealth: Payer: Self-pay | Admitting: Cardiovascular Disease

## 2019-10-19 MED ORDER — POTASSIUM CHLORIDE ER 10 MEQ PO TBCR: 10.0000 meq | EXTENDED_RELEASE_TABLET | Freq: Every day | ORAL | 11 refills | Status: DC

## 2019-10-19 MED ORDER — CHLORTHALIDONE 25 MG PO TABS: 12.5000 mg | ORAL_TABLET | Freq: Every day | ORAL | 11 refills | Status: DC

## 2019-10-19 NOTE — Telephone Encounter (Signed)
Spoke with patient who states she has been having elevated diastolic BP readings for the past week. Reports DBP is >100 on multiple occasions, including BP today was 157/105 mmHg, pulse 65 bpm. We reviewed her medication list and Dr. Timothy Lasso increased both her losartan and her amlodipine about 1 month ago. I have updated her medication list.  Reports recent increase in leg swelling, states "they are swollen real bad." Swelling improves by sleeping with a pillow that she places under her legs but returns throughout the next day. She denies SOB, orthopnea, or PND. She denies high sodium diet or recent changes in medical hx except for recent UTI which cleared with amoxicillin treatment. Last echo was 11/17 and showed normal LV function.  Occasional pain shooting in the right side of her head from back to front that occurs randomly. Denies visual changes. I advised that I will forward message to Dr. Elease Hashimoto for advice and call her back later. She verbalized understanding and agreement and thanked me for the call.

## 2019-10-19 NOTE — Telephone Encounter (Signed)
Lets add chlorthaladone 12.5 mg a day  Kdur 10 meq a day  Check bmp in 2-3 weeks.  Please have her continue to take BP daily and call with her readings next week

## 2019-10-19 NOTE — Telephone Encounter (Signed)
  Pt c/o BP issue: STAT if pt c/o blurred vision, one-sided weakness or slurred speech  1. What are your last 5 BP readings?   157/105 today 147/96 153/96 136/91 149/94  2. Are you having any other symptoms (ex. Dizziness, headache, blurred vision, passed out)? At times headache   3. What is your BP issue? Patient would like to speak to the nurse regarding her blood pressure to see if her medication needs to be adjusted again. It was changed a couple weeks ago per patient.

## 2019-10-19 NOTE — Telephone Encounter (Signed)
Addition to previous note. Patient states her scale is not working properly so she is not monitoring daily weight.  Called patient to review Dr. Harvie Bridge advice. Patient agrees to start chlorthalidone 12.5 mg and kdur 10 mEq once daily in the morning. I scheduled her to come in on 3/4 to see Dr. Elease Hashimoto and advised her to call back sooner with questions or concerns. She verbalized understanding and agreement and thanked me for the call.

## 2019-10-22 ENCOUNTER — Telehealth: Payer: Self-pay | Admitting: Cardiovascular Disease

## 2019-10-22 NOTE — Telephone Encounter (Signed)
Pt c/o medication issue:  1. Name of Medication: chlorthalidone (HYGROTON) 25 MG tablet  2. How are you currently taking this medication (dosage and times per day)? As directed  3. Are you having a reaction (difficulty breathing--STAT)? no  4. What is your medication issue? Patient states that this pill is not helping her go to the bathroom anymore nor is it helping her swelling. She wants to know what else can be done.

## 2019-10-22 NOTE — Telephone Encounter (Signed)
I spoke with patient. She started chlorthalidone on 2/24 and hasn't noticed a decrease in her swelling. BP has improved. She does not have swelling in the AM when she gets up but it increases as the day goes along. Trying to keep legs elevated during the day but reports she probably does not do it as much as she should.  I encouraged her to try to keep legs elevated as much as possible and to watch salt intake.  She does not have scales but states her family can get one for her.  She will then start to weigh daily. She is keeping record of BP readings and will bring to office visit on 3/4

## 2019-10-24 NOTE — Telephone Encounter (Signed)
Her legs need to be higher than her heart I would recommend the Lounge Doctor lounge rest.  Its available on [email or web address].  she can sleep with this  Also recommend compression hose I will see her in several days

## 2019-10-28 ENCOUNTER — Ambulatory Visit: Payer: Medicare Other | Admitting: Cardiovascular Disease

## 2019-10-28 ENCOUNTER — Telehealth: Payer: Self-pay | Admitting: Cardiovascular Disease

## 2019-10-28 DIAGNOSIS — I340 Nonrheumatic mitral (valve) insufficiency: Secondary | ICD-10-CM

## 2019-10-28 DIAGNOSIS — I10 Essential (primary) hypertension: Secondary | ICD-10-CM

## 2019-10-28 DIAGNOSIS — I350 Nonrheumatic aortic (valve) stenosis: Secondary | ICD-10-CM

## 2019-10-28 NOTE — Telephone Encounter (Signed)
Spoke with patient's daughter, Melanie [name], who is at home with her mother. She states her mother is so thankful to report that her BP readings are better and her leg swelling has improved since starting chlorthalidone 12.5 mg. She states she still has mild leg swelling but that the legs are not nearly as tight as previously. Daughter asks if we can postpone the appointment with Dr. Elease Hashimoto from today. I advised that we will need a BMET next week and that we can reschedule patient's appointment for May. Appointments are scheduled and I advised daughter to continue to have patient to monitor BP and HR and to call back if she has questions or concerns. Melanie [name] agrees with plan and thanked me for the call.

## 2019-10-28 NOTE — Telephone Encounter (Signed)
New Message:     Daughter called to give pt's blood pressure readings.   10-26-19-  10:00 pm 136/88 and pulse 65    10-27-19- 12:30 pm-  123/83 and pulse 75

## 2019-10-29 NOTE — Telephone Encounter (Signed)
Patient is currently scheduled for lab work on March 11.  Daughter would like to bring patient in for lab work on March 8.  Appointment changed to March 8.

## 2019-10-29 NOTE — Telephone Encounter (Signed)
I spoke with patient and her daughter. They report BP was 138/76 and pulse was 56 yesterday. Today it was 109/81, 59 and 113/75, 60.  This was after she had taken her AM medications. Patient is feeling tired today. Also felt tired yesterday. During the night her left arm went numb down to her fingers and her left leg felt numb.  She took some deep breaths and numbness went away. Patient reports she has had this in the past also. Patient and her daughter are very concerned that BP may be getting too low.  I told her last BP reading looked good.  She is due to take lopressor later this evening and I told her they could hold this if heart rate less than 55 or systolic BP less than 110.

## 2019-10-29 NOTE — Telephone Encounter (Signed)
Patient calling back with questions about her BP and appointment.

## 2019-11-01 ENCOUNTER — Other Ambulatory Visit: Payer: Medicare Other | Admitting: *Deleted

## 2019-11-01 ENCOUNTER — Other Ambulatory Visit: Payer: Self-pay

## 2019-11-01 DIAGNOSIS — I340 Nonrheumatic mitral (valve) insufficiency: Secondary | ICD-10-CM

## 2019-11-01 DIAGNOSIS — I350 Nonrheumatic aortic (valve) stenosis: Secondary | ICD-10-CM

## 2019-11-01 DIAGNOSIS — I10 Essential (primary) hypertension: Secondary | ICD-10-CM

## 2019-11-01 MED ORDER — METOPROLOL TARTRATE 25 MG PO TABS: 25.0000 mg | ORAL_TABLET | Freq: Two times a day (BID) | ORAL | 3 refills | Status: DC

## 2019-11-01 NOTE — Telephone Encounter (Signed)
BP is better after starting chlorthalidone . Reduce Metoprolol to 25 mg at night .   We may consider changing the metoprolol to toprol xL at her next appt.

## 2019-11-01 NOTE — Telephone Encounter (Signed)
Called patient to check on her and advise that per Dr. Elease Hashimoto, she may reduce her metoprolol tartrate to 25 mg every evening. She reports today BP 131/83 mmHg, pulse 70 bpm. She agrees to reduce metoprolol and continue to monitor. She thanked me for the call and states she is coming in this afternoon to get her blood work done.

## 2019-11-01 NOTE — Addendum Note (Signed)
Addended by: Levi Aland on: 11/01/2019 01:49 PM   Modules accepted: Orders

## 2019-11-02 ENCOUNTER — Telehealth: Payer: Self-pay | Admitting: Nurse Practitioner

## 2019-11-02 DIAGNOSIS — I10 Essential (primary) hypertension: Secondary | ICD-10-CM

## 2019-11-02 DIAGNOSIS — I350 Nonrheumatic aortic (valve) stenosis: Secondary | ICD-10-CM

## 2019-11-02 DIAGNOSIS — I340 Nonrheumatic mitral (valve) insufficiency: Secondary | ICD-10-CM

## 2019-11-02 LAB — BASIC METABOLIC PANEL
BUN/Creatinine Ratio: 11 — ABNORMAL LOW (ref 12–28)
BUN: 10 mg/dL (ref 10–36)
CO2: 27 mmol/L (ref 20–29)
Calcium: 10 mg/dL (ref 8.7–10.3)
Chloride: 86 mmol/L — ABNORMAL LOW (ref 96–106)
Creatinine, Ser: 0.87 mg/dL (ref 0.57–1.00)
GFR calc Af Amer: 66 mL/min/{1.73_m2} (ref >59)
GFR calc non Af Amer: 58 mL/min/{1.73_m2} — ABNORMAL LOW (ref >59)
Glucose: 137 mg/dL — ABNORMAL HIGH (ref 65–99)
Potassium: 3.4 mmol/L — ABNORMAL LOW (ref 3.5–5.2)
Sodium: 127 mmol/L — ABNORMAL LOW (ref 134–144)

## 2019-11-02 MED ORDER — POTASSIUM CHLORIDE ER 10 MEQ PO TBCR: 10.0000 meq | EXTENDED_RELEASE_TABLET | Freq: Two times a day (BID) | ORAL | 3 refills | Status: DC

## 2019-11-02 NOTE — Telephone Encounter (Signed)
Patient aware of results and is scheduled for repeat lab appointment in 1 month. We discussed water consumption and she will limit hers to around 50 oz per day. She understands to continue to limit sodium intake from processed foods. I advised her to call back with questions or concerns prior to her appointment. She verbalized understanding and agreement with plan and thanked me for the call.

## 2019-11-02 NOTE — Telephone Encounter (Signed)
-----   Message from Vesta Mixer, MD sent at 11/02/2019  5:31 PM EST ----- Double potassium to 10 meq BID . Watch her water intake.  Recheck in 1 month

## 2019-11-04 ENCOUNTER — Other Ambulatory Visit: Payer: Medicare Other

## 2019-12-01 ENCOUNTER — Other Ambulatory Visit: Payer: Self-pay

## 2019-12-01 ENCOUNTER — Other Ambulatory Visit: Payer: Medicare Other | Admitting: *Deleted

## 2019-12-01 DIAGNOSIS — I350 Nonrheumatic aortic (valve) stenosis: Secondary | ICD-10-CM

## 2019-12-01 DIAGNOSIS — I340 Nonrheumatic mitral (valve) insufficiency: Secondary | ICD-10-CM

## 2019-12-01 DIAGNOSIS — I10 Essential (primary) hypertension: Secondary | ICD-10-CM

## 2019-12-02 LAB — BASIC METABOLIC PANEL
BUN/Creatinine Ratio: 16 (ref 12–28)
BUN: 12 mg/dL (ref 10–36)
CO2: 27 mmol/L (ref 20–29)
Calcium: 10.2 mg/dL (ref 8.7–10.3)
Chloride: 92 mmol/L — ABNORMAL LOW (ref 96–106)
Creatinine, Ser: 0.77 mg/dL (ref 0.57–1.00)
GFR calc Af Amer: 77 mL/min/{1.73_m2} (ref >59)
GFR calc non Af Amer: 67 mL/min/{1.73_m2} (ref >59)
Glucose: 129 mg/dL — ABNORMAL HIGH (ref 65–99)
Potassium: 4.7 mmol/L (ref 3.5–5.2)
Sodium: 129 mmol/L — ABNORMAL LOW (ref 134–144)

## 2019-12-03 ENCOUNTER — Telehealth: Payer: Self-pay | Admitting: Nurse Practitioner

## 2019-12-03 NOTE — Telephone Encounter (Signed)
-----   Message from Vesta Mixer, MD sent at 12/02/2019 10:55 AM EDT ----- She has become hyponatremic on the chlorthalidone. Let us DC the chlorthalidone and potassium. Start spironolactone 25 mg a day.  Recheck labs in 2 to 3 weeks.

## 2019-12-03 NOTE — Telephone Encounter (Signed)
Reviewed lab results and plan of care with patient. She states she has not taken chlorthalidone this morning because BP yesterday was 119/76, HR 56 at 1200 and the night before at 9:45 pm BP was 107/77, HR 63. She agrees to stop chlorthalidone and potassium chloride. Per Dr. Elease Hashimoto, we will have her monitor her BP through the weekend before starting spironolactone. I advised I will call her back early next week to discuss and to call me early Monday if she has elevated BP readings through the weekend. She verbalized understanding and agreement with plan and thanked me for the call.

## 2019-12-06 NOTE — Telephone Encounter (Signed)
Called patient to assess BP readings over the weekend since stopping chlorthalidone and kdur. She reports today @ 1230 BP is 128/80, pulse 58.  4/11 150/83, 68 @ 9:40 pm 4/8 119/76, 56 @ 1215 pm She would like to not start spironolactone at this time and continue to monitor. She is aware that her BP goal is < 130/80. She states she will continue to monitor BP twice daily and will call back if BP is consistently elevated. She reports that she feels well. She thanked me for the call.

## 2019-12-28 ENCOUNTER — Other Ambulatory Visit: Payer: Self-pay

## 2019-12-28 ENCOUNTER — Ambulatory Visit (INDEPENDENT_AMBULATORY_CARE_PROVIDER_SITE_OTHER): Payer: Medicare Other | Admitting: Cardiovascular Disease

## 2019-12-28 ENCOUNTER — Encounter: Payer: Self-pay | Admitting: Cardiovascular Disease

## 2019-12-28 VITALS — BP 120/62 | HR 68 | Ht 59.0 in | Wt 105.1 lb

## 2019-12-28 DIAGNOSIS — I350 Nonrheumatic aortic (valve) stenosis: Secondary | ICD-10-CM

## 2019-12-28 DIAGNOSIS — I340 Nonrheumatic mitral (valve) insufficiency: Secondary | ICD-10-CM

## 2019-12-28 DIAGNOSIS — I10 Essential (primary) hypertension: Secondary | ICD-10-CM | POA: Diagnosis not present

## 2019-12-28 NOTE — Progress Notes (Signed)
Cardiology Office Note   Date:  12/28/2019   ID:  Melanie [name], DOB Dec 25, 1923, MRN [id number]  PCP:  Melanie Baton, MD  Cardiologist:   Melanie Moores, MD   Chief Complaint  Patient presents with  . Hypertension   Problem List 1. Mitral regurgitation  2. Back pains  3. Mild left carotid artery artery disease    Previous notes.  Melanie [name] is a 86 y.o. female who presents for evaluation of a new heart murmur .  She reports that she is here for a regular check up - no CP or dyspnea.  She used to see Dr. Minna [name]  ( moved to [place], [place])  [place] a record of her BP and HR readings - all look very good.   She has had an echo in Dr. Mila [name] office. Normal LV function . Possible PFO. , trace AI  Mild MR  BP and HR is very well controlled  Nov. 16, 2017:     Melanie [name] is seen back for a follow up visit after a year.  Her husband passed away this past Jan 25, 2023.   Awoke with some heart flutters ,   BP was a little elevated BP is great .  November 26, 2017: Melanie [name] seen back today for follow-up of her mild mitral regurgitation and hypertension. Was seen by Melanie [name] in July for palpitations .   No arrhythmias found  Has been to the ER several times.  - once for a UTI Another for reaction to a med - ? Flu shot   Not so much exercise recently but is active.   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  Has mild AS, mil dMR, mild TR   Nov. 4 2020  Has some swelling in her ankles at night. Better by morning.  She has a history of hypertension and I see that she is on amlodipine 2.5 mg twice a day.  She is on potassium chloride but is not on a diuretic.  Her potassium was low at her last BMP   Melanie [name] has prescribed her the potassium   Dec 28, 2019:  Doing well  Has some occasional upper gastric pressure Occurs when she is sitting after eating .  Is not related to exertion   Past Medical History:  Diagnosis Date  . Bruises  easily    on Aspirin;on hold for surgery  . Carotid stenosis   . CHF (congestive heart failure) (HCC)    diastolic dysfunction  . Chronic back pain    stenosis  . Chronic sinusitis   . Diverticulosis   . Esophageal stricture   . GERD (gastroesophageal reflux disease)    takes Protonix daily  . Heart murmur    as a child  . Hemorrhoids   . History of colon polyps   . Hypertension    takes Amlodipine,HCTZ,and Metoprolol  . Insomnia   . Mitral insufficiency and aortic stenosis   . Palpitations   . Peripheral edema   . Schatzki's ring   . Scoliosis   . Stroke [facility])     Past Surgical History:  Procedure Laterality Date  . BREAST ENHANCEMENT SURGERY    . CHOLECYSTECTOMY    . COLONOSCOPY    . DILATION AND CURETTAGE OF UTERUS    . epidural injections    . ESOPHAGOGASTRODUODENOSCOPY    . EYE SURGERY     left cataract removed  . LUMBAR LAMINECTOMY/DECOMPRESSION  MICRODISCECTOMY Left 01/26/2013   Procedure: LUMBAR LAMINECTOMY/DECOMPRESSION MICRODISCECTOMY 1 LEVEL;  Surgeon: Melanie Abu, MD;  Location: [facility];  Service: Neurosurgery;  Laterality: Left;  Left Lumbar four-five Laminectomy/Foraminotomy  . steroid injections     x 3  . TOTAL HIP ARTHROPLASTY Right      Current Outpatient Medications  Medication Sig Dispense Refill  . amLODipine (NORVASC) 2.5 MG tablet Take 2.5 mg by mouth 2 (two) times daily.    . celecoxib (CELEBREX) 200 MG capsule Take 200 mg by mouth as needed.     . gabapentin (NEURONTIN) 100 MG capsule Take 1 capsule by mouth at bedtime.    Marland Kitchen losartan (COZAAR) 100 MG tablet Take 100 mg by mouth daily.    . metoprolol tartrate (LOPRESSOR) 25 MG tablet Take 1 tablet (25 mg total) by mouth 2 (two) times daily. 90 tablet 3  . Multiple Vitamin (MULTIVITAMIN WITH MINERALS) TABS tablet Take 1 tablet by mouth daily.    . pantoprazole (PROTONIX) 40 MG tablet Take 40 mg by mouth daily.    . Tetrahydrozoline HCl (VISINE OP) Place 1 drop into both eyes daily as  needed (dry eyes).     No current facility-administered medications for this visit.    Allergies:   Ciprofloxacin    Social History:  The patient  reports that she has never smoked. She has never used smokeless tobacco. She reports that she does not drink alcohol or use drugs.   Family History:  The patient's family history includes CAD in her brother; Lung cancer in her brother; Renal cancer in her sister; Rheum arthritis in her mother.    ROS:  Please see the history of present illness.      Physical Exam: Blood pressure 120/62, pulse 68, height 4\' 11"  (1.499 m), weight 105 lb 1.9 oz (47.7 kg).  GEN:   Elderly female   HEENT: Normal NECK: No JVD; No carotid bruits LYMPHATICS: No lymphadenopathy CARDIAC: RRR, 2/6 systolic murmur  RESPIRATORY:  Clear to auscultation without rales, wheezing or rhonchi  ABDOMEN: Soft, non-tender, non-distended MUSCULOSKELETAL:  No edema; No deformity  SKIN: Warm and dry NEUROLOGIC:  Alert and oriented x 3   EKG: Dec 28, 2019: Normal sinus rhythm at 68.  No ST or T wave changes.   Recent Labs: 12/01/2019: BUN 12; Creatinine, Ser 0.77; Potassium 4.7; Sodium 129    Lipid Panel    Component Value Date/Time   CHOL 181 07/18/2016 0253   TRIG 93 07/18/2016 0253   HDL 56 07/18/2016 0253   CHOLHDL 3.2 07/18/2016 0253   VLDL 19 07/18/2016 0253   LDLCALC 106 (H) 07/18/2016 0253      Wt Readings from Last 3 Encounters:  12/28/19 105 lb 1.9 oz (47.7 kg)  06/30/19 107 lb 6.4 oz (48.7 kg)  12/08/18 108 lb (49 kg)      Other studies Reviewed: Additional studies/ records that were reviewed today include: . Review of the above records demonstrates:    ASSESSMENT AND PLAN: . 1. Mitral regurgitation  -  Stable   2. Back pains  :  stabel   3. Mild left carotid artery artery disease : no symptoms .     4.  HTN:    BP is well controlled at amlodipine 2.5 mg BID.   BP became elevated after she took her Covid vaccine  Cont current regimine.        Current medicines are reviewed at length with the patient today.  The patient does  not have concerns regarding medicines.  The following changes have been made:  no change  Labs/ tests ordered today include:   No orders of the defined types were placed in this encounter.      Kristeen Miss, MD  12/28/2019 2:33 PM    [facility] [address] [place], [place], [place]  [address] Phone: [phone]; Fax: [phone]

## 2019-12-28 NOTE — Patient Instructions (Signed)

## 2020-06-08 ENCOUNTER — Emergency Department (HOSPITAL_BASED_OUTPATIENT_CLINIC_OR_DEPARTMENT_OTHER): Payer: Medicare Other

## 2020-06-08 ENCOUNTER — Encounter (HOSPITAL_BASED_OUTPATIENT_CLINIC_OR_DEPARTMENT_OTHER): Payer: Self-pay | Admitting: *Deleted

## 2020-06-08 ENCOUNTER — Emergency Department (HOSPITAL_BASED_OUTPATIENT_CLINIC_OR_DEPARTMENT_OTHER)
Admission: EM | Admit: 2020-06-08 | Discharge: 2020-06-08 | Disposition: A | Discharge status: Home / Self Care | Payer: Medicare Other | Attending: Emergency Medicine | Admitting: Emergency Medicine

## 2020-06-08 ENCOUNTER — Other Ambulatory Visit: Payer: Self-pay

## 2020-06-08 DIAGNOSIS — I509 Heart failure, unspecified: Secondary | ICD-10-CM | POA: Insufficient documentation

## 2020-06-08 DIAGNOSIS — I110 Hypertensive heart disease with heart failure: Secondary | ICD-10-CM | POA: Diagnosis not present

## 2020-06-08 DIAGNOSIS — Z79899 Other long term (current) drug therapy: Secondary | ICD-10-CM | POA: Insufficient documentation

## 2020-06-08 DIAGNOSIS — Z96641 Presence of right artificial hip joint: Secondary | ICD-10-CM | POA: Diagnosis not present

## 2020-06-08 DIAGNOSIS — R519 Headache, unspecified: Secondary | ICD-10-CM | POA: Diagnosis present

## 2020-06-08 LAB — CBC WITH DIFFERENTIAL/PLATELET
Abs Immature Granulocytes: 0.02 10*3/uL (ref 0.00–0.07)
Basophils Absolute: 0 10*3/uL (ref 0.0–0.1)
Basophils Relative: 0 %
Eosinophils Absolute: 0.3 10*3/uL (ref 0.0–0.5)
Eosinophils Relative: 4 %
HCT: 41.6 % (ref 36.0–46.0)
Hemoglobin: 13.9 g/dL (ref 12.0–15.0)
Immature Granulocytes: 0 %
Lymphocytes Relative: 19 %
Lymphs Abs: 1.4 10*3/uL (ref 0.7–4.0)
MCH: 30.7 pg (ref 26.0–34.0)
MCHC: 33.4 g/dL (ref 30.0–36.0)
MCV: 91.8 fL (ref 80.0–100.0)
Monocytes Absolute: 0.6 10*3/uL (ref 0.1–1.0)
Monocytes Relative: 8 %
Neutro Abs: 4.8 10*3/uL (ref 1.7–7.7)
Neutrophils Relative %: 69 %
Platelets: 114 10*3/uL — ABNORMAL LOW (ref 150–400)
RBC: 4.53 MIL/uL (ref 3.87–5.11)
RDW: 14 % (ref 11.5–15.5)
Smear Review: DECREASED
WBC: 7 10*3/uL (ref 4.0–10.5)
nRBC: 0 % (ref 0.0–0.2)

## 2020-06-08 LAB — COMPREHENSIVE METABOLIC PANEL
ALT: 9 U/L (ref 0–44)
AST: 20 U/L (ref 15–41)
Albumin: 3.8 g/dL (ref 3.5–5.0)
Alkaline Phosphatase: 84 U/L (ref 38–126)
Anion gap: 9 (ref 5–15)
BUN: 11 mg/dL (ref 8–23)
CO2: 25 mmol/L (ref 22–32)
Calcium: 9.2 mg/dL (ref 8.9–10.3)
Chloride: 100 mmol/L (ref 98–111)
Creatinine, Ser: 0.78 mg/dL (ref 0.44–1.00)
GFR, Estimated: >60 mL/min (ref >60)
Glucose, Bld: 123 mg/dL — ABNORMAL HIGH (ref 70–99)
Potassium: 4.2 mmol/L (ref 3.5–5.1)
Sodium: 134 mmol/L — ABNORMAL LOW (ref 135–145)
Total Bilirubin: 0.3 mg/dL (ref 0.3–1.2)
Total Protein: 6.6 g/dL (ref 6.5–8.1)

## 2020-06-08 LAB — URINALYSIS, ROUTINE W REFLEX MICROSCOPIC
Bilirubin Urine: NEGATIVE
Glucose, UA: NEGATIVE mg/dL
Hgb urine dipstick: NEGATIVE
Ketones, ur: NEGATIVE mg/dL
Leukocytes,Ua: NEGATIVE
Nitrite: NEGATIVE
Protein, ur: NEGATIVE mg/dL
Specific Gravity, Urine: <1.005 — ABNORMAL LOW (ref 1.005–1.030)
pH: 5.5 (ref 5.0–8.0)

## 2020-06-08 LAB — LIPASE, BLOOD: Lipase: 37 U/L (ref 11–51)

## 2020-06-08 MED ORDER — ACETAMINOPHEN 325 MG PO TABS
650.0000 mg | ORAL_TABLET | Freq: Once | ORAL | Status: AC
  Administered 2020-06-08: 650 mg via ORAL
  Filled 2020-06-08: qty 2

## 2020-06-08 MED ORDER — SODIUM CHLORIDE 0.9 % IV BOLUS
500.0000 mL | Freq: Once | INTRAVENOUS | Status: AC
  Administered 2020-06-08: 500 mL via INTRAVENOUS

## 2020-06-08 NOTE — ED Provider Notes (Signed)
[facility] EMERGENCY DEPARTMENT Provider Note   CSN: [id number] Arrival date & time: 06/08/20  1403     History Chief Complaint  Patient presents with  . Headache    Melanie [name] is a 86 y.o. female history of hypertension, previous TIA here presenting with headaches. Patient states that about 2 days ago she had an episode of left-sided headache. She states that it is associated with a period of confusion lasted about 30 minutes. Patient states that her confusion has resolved. However intermittently she has have headaches on the left side. She denies any weakness or trouble speaking. Patient denies any cough or abdominal pain or vomiting. Patient had previous work-up for stroke and had a recent MRI in January that did not show any stroke.  The history is provided by the patient.       Past Medical History:  Diagnosis Date  . Bruises easily    on Aspirin;on hold for surgery  . Carotid stenosis   . CHF (congestive heart failure) (HCC)    diastolic dysfunction  . Chronic back pain    stenosis  . Chronic sinusitis   . Diverticulosis   . Esophageal stricture   . GERD (gastroesophageal reflux disease)    takes Protonix daily  . Heart murmur    as a child  . Hemorrhoids   . History of colon polyps   . Hypertension    takes Amlodipine,HCTZ,and Metoprolol  . Insomnia   . Mitral insufficiency and aortic stenosis   . Palpitations   . Peripheral edema   . Schatzki's ring   . Scoliosis   . Stroke [facility])     Patient Active Problem List   Diagnosis Date Noted  . Palpitations   . Acute CVA (cerebrovascular accident) (HCC) 07/18/2016  . TIA (transient ischemic attack)   . Hyperlipidemia   . Cerebrovascular accident (CVA) due to embolism of left middle cerebral artery (HCC)   . 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 Essential hypertension 07/17/2016  . Hypokalemia 07/17/2016  . Mitral regurgitation 02/24/2015  . Spinal stenosis, lumbar region, with  neurogenic claudication 01/27/2013  . Lumbar scoliosis 01/27/2013    Past Surgical History:  Procedure Laterality Date  . BREAST ENHANCEMENT SURGERY    . CHOLECYSTECTOMY    . COLONOSCOPY    . DILATION AND CURETTAGE OF UTERUS    . epidural injections    . ESOPHAGOGASTRODUODENOSCOPY    . EYE SURGERY     left cataract removed  . LUMBAR LAMINECTOMY/DECOMPRESSION MICRODISCECTOMY Left 01/26/2013   Procedure: LUMBAR LAMINECTOMY/DECOMPRESSION MICRODISCECTOMY 1 LEVEL;  Surgeon: Barnett Abu, MD;  Location: [facility];  Service: Neurosurgery;  Laterality: Left;  Left Lumbar four-five Laminectomy/Foraminotomy  . steroid injections     x 3  . TOTAL HIP ARTHROPLASTY Right      OB History   No obstetric history on file.     Family History  Problem Relation Age of Onset  . Rheum arthritis Mother   . Renal cancer Sister   . CAD Brother   . Lung cancer Brother     Social History   Tobacco Use  . Smoking status: Never Smoker  . Smokeless tobacco: Never Used  Substance Use Topics  . Alcohol use: No  . Drug use: No    Home Medications Prior to Admission medications   Medication Sig Start Date End Date Taking? Authorizing Provider  amLODipine (NORVASC) 2.5 MG tablet Take 2.5 mg by mouth 2 (two) times daily.  [provider]  celecoxib (CELEBREX) 200 MG capsule Take 200 mg by mouth as needed.  06/25/19   [provider]  gabapentin (NEURONTIN) 100 MG capsule Take 1 capsule by mouth at bedtime. 06/25/19   [provider]  losartan (COZAAR) 100 MG tablet Take 100 mg by mouth daily.    [provider]  metoprolol tartrate (LOPRESSOR) 25 MG tablet Take 1 tablet (25 mg total) by mouth 2 (two) times daily. 11/01/19   Nahser, Deloris Ping, MD  Multiple Vitamin (MULTIVITAMIN WITH MINERALS) TABS tablet Take 1 tablet by mouth daily.    [provider]  pantoprazole (PROTONIX) 40 MG tablet Take 40 mg by mouth daily. 07/15/18   [provider]    Tetrahydrozoline HCl (VISINE OP) Place 1 drop into both eyes daily as needed (dry eyes).    [provider]    Allergies    Ciprofloxacin  Review of Systems   Review of Systems  Neurological: Positive for headaches.  All other systems reviewed and are negative.   Physical Exam Updated Vital Signs BP (!) 160/86 (BP Location: Right Arm)   Pulse (!) 58   Temp 98.3 F (36.8 C) (Oral)   Resp 16   Ht 4\' 11"  (1.499 m)   Wt 47.6 kg   SpO2 100%   BMI 21.21 kg/m   Physical Exam Vitals and nursing note reviewed.  Constitutional:      Comments: Well-appearing for age  HENT:     Head: Normocephalic.     Comments: No temporal area tenderness    Mouth/Throat:     Mouth: Mucous membranes are moist.  Eyes:     Extraocular Movements: Extraocular movements intact.     Pupils: Pupils are equal, round, and reactive to light.  Cardiovascular:     Rate and Rhythm: Normal rate and regular rhythm.     Heart sounds: Normal heart sounds.  Pulmonary:     Effort: Pulmonary effort is normal.     Breath sounds: Normal breath sounds.  Abdominal:     General: Bowel sounds are normal.     Palpations: Abdomen is soft.  Musculoskeletal:        General: Normal range of motion.     Cervical back: Normal range of motion and neck supple.  Skin:    General: Skin is warm.  Neurological:     Mental Status: She is alert and oriented to person, place, and time.     Cranial Nerves: No cranial nerve deficit or facial asymmetry.     Sensory: No sensory deficit.     Motor: No weakness.     Coordination: Romberg sign negative.     Gait: Gait normal.     Comments: Cranial nerves II to XII intact. No obvious facial droop. Normal finger-to-nose bilaterally and normal strength and sensation bilaterally. Negative Romberg and normal gait.  Psychiatric:        Mood and Affect: Mood normal.        Behavior: Behavior normal.     ED Results / Procedures / Treatments   Labs (all labs ordered are  listed, but only abnormal results are displayed) Labs Reviewed  CBC WITH DIFFERENTIAL/PLATELET  COMPREHENSIVE METABOLIC PANEL  URINALYSIS, ROUTINE W REFLEX MICROSCOPIC  LIPASE, BLOOD    EKG None  Radiology DG Chest 2 View  Result Date: 06/08/2020 CLINICAL DATA:  Headaches and altered mental status EXAM: CHEST - 2 VIEW COMPARISON:  09/14/2018 FINDINGS: Cardiac shadow is at the upper limits of  normal in size. Aortic calcifications are again seen. Calcified breast implants are noted bilaterally. No focal infiltrate or effusion is seen. No acute bony abnormality is noted. S-shaped scoliosis of the thoracolumbar spine is again noted. IMPRESSION: No acute abnormality noted.  No acute abnormality noted. Electronically Signed   By: Alcide Clever M.D.   On: 06/08/2020 16:10   CT Head Wo Contrast  Result Date: 06/08/2020 CLINICAL DATA:  Altered mental status EXAM: CT HEAD WITHOUT CONTRAST TECHNIQUE: Contiguous axial images were obtained from the base of the skull through the vertex without intravenous contrast. COMPARISON:  09/14/2018 FINDINGS: Brain: Chronic atrophic changes and white matter ischemic changes are again noted and stable. Findings to suggest acute hemorrhage, acute infarction or space-occupying mass lesion are seen. Scattered parenchymal calcifications are noted. Vascular: No hyperdense vessel or unexpected calcification. Skull: Normal. Negative for fracture or focal lesion. Sinuses/Orbits: No acute finding. Other: None. IMPRESSION: Chronic atrophic and ischemic changes without acute abnormality. Electronically Signed   By: Alcide Clever M.D.   On: 06/08/2020 16:13    Procedures Procedures (including critical care time)  Medications Ordered in ED Medications  sodium chloride 0.9 % bolus 500 mL (has no administration in time range)  acetaminophen (TYLENOL) tablet 650 mg (650 mg Oral Given 06/08/20 1608)    ED Course  I have reviewed the triage vital signs and the nursing  notes.  Pertinent labs & imaging results that were available during my care of the patient were reviewed by me and considered in my medical decision making (see chart for details).    MDM Rules/Calculators/A&P                         Melanie [name] is a 86 y.o. female here presenting with headaches. Likely migraines versus tension headache. Patient has a remote history of TIA and after her stroke work-up and never was diagnosed with a stroke. She had an episode with confusion 2 days ago but nothing more recent . At this point, I think if a CT head did not show any bleed or obvious stroke, we will not proceed with MRI and have low suspicion for stroke right now. Plan to get CT head and CBC and CMP and UA and chest x-ray. We will give some Tylenol for headache.  6:40 PM Labs and CT head unremarkable. UA and CXR clear. Headache improved with tylenol. Stable for discharge    Final Clinical Impression(s) / ED Diagnoses Final diagnoses:  None    Rx / DC Orders ED Discharge Orders    None       Charlynne Pander, MD 06/08/20 [phone]

## 2020-06-08 NOTE — ED Triage Notes (Signed)
Headache  X 2 days off and on. She is alert and oriented. Ambulatory.

## 2020-06-08 NOTE — Discharge Instructions (Signed)
Take Tylenol as needed for headache.  Your CT scan and lab work and x-rays and urinalysis are normal today.  See your doctor for follow-up  Return to ER if you have worse headaches, trouble speaking, confusion.

## 2020-07-01 ENCOUNTER — Other Ambulatory Visit: Payer: Self-pay | Admitting: Cardiovascular Disease

## 2020-08-03 ENCOUNTER — Other Ambulatory Visit: Payer: Self-pay | Admitting: Cardiovascular Disease

## 2020-08-10 ENCOUNTER — Other Ambulatory Visit: Payer: Self-pay | Admitting: Cardiovascular Disease

## 2020-08-11 ENCOUNTER — Telehealth: Payer: Self-pay | Admitting: Cardiovascular Disease

## 2020-08-11 NOTE — Telephone Encounter (Signed)
New message:     Patient calling stating that she have changed pharmacy to [facility]. They will calling you. [phone]

## 2020-08-11 NOTE — Telephone Encounter (Signed)
Have update pt's chart to reflect [facility]

## 2020-08-14 ENCOUNTER — Other Ambulatory Visit: Payer: Self-pay

## 2020-08-14 MED ORDER — AMLODIPINE BESYLATE 2.5 MG PO TABS
2.5000 mg | ORAL_TABLET | Freq: Every day | ORAL | 1 refills | Status: DC
Start: 2020-08-14 — End: 2021-03-28

## 2020-08-14 MED ORDER — LOSARTAN POTASSIUM 100 MG PO TABS
100.0000 mg | ORAL_TABLET | Freq: Every day | ORAL | 1 refills | Status: DC
Start: 2020-08-14 — End: 2021-03-29

## 2020-08-14 NOTE — Telephone Encounter (Signed)
Pt's medications were sent to pt's pharmacy as requested. Confirmation received.  

## 2021-02-22 ENCOUNTER — Other Ambulatory Visit: Payer: Self-pay | Admitting: Physician Assistant

## 2021-02-22 DIAGNOSIS — M545 Low back pain, unspecified: Secondary | ICD-10-CM

## 2021-02-22 DIAGNOSIS — M5410 Radiculopathy, site unspecified: Secondary | ICD-10-CM

## 2021-02-23 ENCOUNTER — Other Ambulatory Visit: Payer: Self-pay | Admitting: Physician Assistant

## 2021-02-23 ENCOUNTER — Ambulatory Visit
Admission: RE | Admit: 2021-02-23 | Discharge: 2021-02-23 | Disposition: A | Discharge status: Home / Self Care | Payer: Medicare Other | Source: Ambulatory Visit | Attending: Physician Assistant | Admitting: Physician Assistant

## 2021-02-23 ENCOUNTER — Other Ambulatory Visit: Payer: Self-pay

## 2021-02-23 DIAGNOSIS — M545 Low back pain, unspecified: Secondary | ICD-10-CM

## 2021-03-14 ENCOUNTER — Other Ambulatory Visit: Payer: Self-pay

## 2021-03-14 ENCOUNTER — Ambulatory Visit
Admission: RE | Admit: 2021-03-14 | Discharge: 2021-03-14 | Disposition: A | Discharge status: Home / Self Care | Payer: Medicare Other | Source: Ambulatory Visit | Attending: Physician Assistant | Admitting: Physician Assistant

## 2021-03-14 DIAGNOSIS — M5410 Radiculopathy, site unspecified: Secondary | ICD-10-CM

## 2021-03-14 DIAGNOSIS — M545 Low back pain, unspecified: Secondary | ICD-10-CM

## 2021-03-28 ENCOUNTER — Telehealth: Payer: Self-pay | Admitting: Cardiovascular Disease

## 2021-03-28 ENCOUNTER — Ambulatory Visit (INDEPENDENT_AMBULATORY_CARE_PROVIDER_SITE_OTHER): Payer: Medicare Other | Admitting: Cardiovascular Disease

## 2021-03-28 ENCOUNTER — Other Ambulatory Visit: Payer: Self-pay

## 2021-03-28 ENCOUNTER — Encounter: Payer: Self-pay | Admitting: Cardiovascular Disease

## 2021-03-28 VITALS — BP 148/70 | HR 64 | Ht 59.0 in | Wt 111.0 lb

## 2021-03-28 DIAGNOSIS — I350 Nonrheumatic aortic (valve) stenosis: Secondary | ICD-10-CM

## 2021-03-28 DIAGNOSIS — I10 Essential (primary) hypertension: Secondary | ICD-10-CM

## 2021-03-28 DIAGNOSIS — I340 Nonrheumatic mitral (valve) insufficiency: Secondary | ICD-10-CM

## 2021-03-28 DIAGNOSIS — M7989 Other specified soft tissue disorders: Secondary | ICD-10-CM | POA: Diagnosis not present

## 2021-03-28 MED ORDER — METOPROLOL SUCCINATE ER 25 MG PO TB24: 25.0000 mg | ORAL_TABLET | Freq: Every day | ORAL | 3 refills | Status: DC

## 2021-03-28 MED ORDER — POTASSIUM CHLORIDE ER 10 MEQ PO TBCR: 10.0000 meq | EXTENDED_RELEASE_TABLET | Freq: Every day | ORAL | 3 refills | Status: DC

## 2021-03-28 MED ORDER — HYDROCHLOROTHIAZIDE 25 MG PO TABS: 25.0000 mg | ORAL_TABLET | Freq: Every day | ORAL | 3 refills | Status: DC

## 2021-03-28 MED ORDER — AMLODIPINE BESYLATE 2.5 MG PO TABS: 2.5000 mg | ORAL_TABLET | Freq: Every day | ORAL | 3 refills | Status: DC

## 2021-03-28 NOTE — Telephone Encounter (Signed)
Pt c/o swelling: STAT is pt has developed SOB within 24 hours  How much weight have you gained and in what time span? no  If swelling, where is the swelling located? Ankle to the waist line  Are you currently taking a fluid pill? yes  Are you currently SOB? no  Do you have a log of your daily weights (if so, list)? no  Have you gained 3 pounds in a day or 5 pounds in a week? no  Have you traveled recently? No  Swollen and tight at night from her angle up to the waist. And has about 3-4 varicose veins coming up her leg. Pain in right side of her head that comes and goes.   Was able to get the patient in today for 4:00 appt

## 2021-03-28 NOTE — Progress Notes (Signed)
Cardiology Office Note   Date:  03/28/2021   ID:  Melanie [name], DOB 06-Mar-1924, MRN [id number]  PCP:  Creola Corn, MD  Cardiologist:   Kristeen Miss, MD   No chief complaint on file.  Problem List 1. Mitral regurgitation  2. Back pains  3. Mild left carotid artery artery disease    Previous notes.  Melanie [name] is a 87 y.o. female who presents for evaluation of a new heart murmur .  She reports that she is here for a regular check up - no CP or dyspnea.  She used to see Dr. [name]  ( moved to [place], [place])  Brought a record of her BP and HR readings - all look very good.   She has had an echo in Dr. [name] office. Normal LV function . Possible PFO. , trace AI  Mild MR  BP and HR is very well controlled  Nov. 16, 2017:     Melanie [name] is seen back for a follow up visit after a year.  Her husband passed away this past 2023-01-24.   Awoke with some heart flutters ,   BP was a little elevated BP is great .  November 26, 2017: Melanie [name] seen back today for follow-up of her mild mitral regurgitation and hypertension. Was seen by [name] in July for palpitations .   No arrhythmias found  Has been to the ER several times.  - once for a UTI Another for reaction to a med - ? Flu shot   Not so much exercise recently but is active.   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  Has mild AS, mil dMR, mild TR   Nov. 4 2020  Has some swelling in her ankles at night. Better by morning.  She has a history of hypertension and I see that she is on amlodipine 2.5 mg twice a day.  She is on potassium chloride but is not on a diuretic.  Her potassium was low at her last BMP   Dr. Timothy Lasso has prescribed her the potassium   Dec 28, 2019:  Doing well  Has some occasional upper gastric pressure Occurs when she is sitting after eating .  Is not related to exertion   March 28, 2021: Melanie [name]  is seen today for further evaluation of some leg  edema. She has not been eating much salt.  She does not elevate her legs.  We discussed leg elevation.  She is on amlodipine which could be causing her leg edema to be worse than it otherwise would be.  Past Medical History:  Diagnosis Date   Bruises easily    on Aspirin;on hold for surgery   Carotid stenosis    CHF (congestive heart failure) (HCC)    diastolic dysfunction   Chronic back pain    stenosis   Chronic sinusitis    Diverticulosis    Esophageal stricture    GERD (gastroesophageal reflux disease)    takes Protonix daily   Heart murmur    as a child   Hemorrhoids    History of colon polyps    Hypertension    takes Amlodipine,HCTZ,and Metoprolol   Insomnia    Mitral insufficiency and aortic stenosis    Palpitations    Peripheral edema    Schatzki's ring    Scoliosis    Stroke [facility])     Past Surgical History:  Procedure Laterality Date  BREAST ENHANCEMENT SURGERY     CHOLECYSTECTOMY     COLONOSCOPY     DILATION AND CURETTAGE OF UTERUS     epidural injections     ESOPHAGOGASTRODUODENOSCOPY     EYE SURGERY     left cataract removed   LUMBAR LAMINECTOMY/DECOMPRESSION MICRODISCECTOMY Left 01/26/2013   Procedure: LUMBAR LAMINECTOMY/DECOMPRESSION MICRODISCECTOMY 1 LEVEL;  Surgeon: Barnett Abu, MD;  Location: [facility];  Service: Neurosurgery;  Laterality: Left;  Left Lumbar four-five Laminectomy/Foraminotomy   steroid injections     x 3   TOTAL HIP ARTHROPLASTY Right      Current Outpatient Medications  Medication Sig Dispense Refill   amLODipine (NORVASC) 2.5 MG tablet Take 1 tablet (2.5 mg total) by mouth daily. 90 tablet 3   gabapentin (NEURONTIN) 100 MG capsule Take 1 capsule by mouth at bedtime.     hydrochlorothiazide (HYDRODIURIL) 25 MG tablet Take 1 tablet (25 mg total) by mouth daily. 90 tablet 3   losartan (COZAAR) 100 MG tablet Take 1 tablet (100 mg total) by mouth daily. 90 tablet 1   metoprolol succinate (TOPROL XL) 25 MG 24 hr tablet Take 1  tablet (25 mg total) by mouth daily. 90 tablet 3   Multiple Vitamin (MULTIVITAMIN WITH MINERALS) TABS tablet Take 1 tablet by mouth daily.     pantoprazole (PROTONIX) 40 MG tablet Take 40 mg by mouth daily.     potassium chloride (KLOR-CON) 10 MEQ tablet Take 1 tablet (10 mEq total) by mouth daily. 90 tablet 3   Tetrahydrozoline HCl (VISINE OP) Place 1 drop into both eyes daily as needed (dry eyes).     celecoxib (CELEBREX) 200 MG capsule Take 200 mg by mouth as needed.  (Patient not taking: Reported on 03/28/2021)     No current facility-administered medications for this visit.    Allergies:   Ciprofloxacin    Social History:  The patient  reports that she has never smoked. She has never used smokeless tobacco. She reports that she does not drink alcohol and does not use drugs.   Family History:  The patient's family history includes CAD in her brother; Lung cancer in her brother; Renal cancer in her sister; Rheum arthritis in her mother.    ROS:  Please see the history of present illness.     Physical Exam: Blood pressure (!) 148/70, pulse 64, height 4\' 11"  (1.499 m), weight 111 lb (50.3 kg), SpO2 98 %.  GEN:  Well nourished, well developed in no acute distress HEENT: Normal NECK: No JVD;  soft left carotid bruit  LYMPHATICS: No lymphadenopathy CARDIAC: RRR , soft systolic murmur  RESPIRATORY:  Clear to auscultation without rales, wheezing or rhonchi  ABDOMEN: Soft, non-tender, non-distended MUSCULOSKELETAL: trace - 1+ edema ; No deformity  SKIN: Warm and dry NEUROLOGIC:  Alert and oriented x 3    EKG:  Aug. 3, 2022 NSR at 64.  No ST or T wave change.    Recent Labs: 06/08/2020: ALT 9; BUN 11; Creatinine, Ser 0.78; Hemoglobin 13.9; Platelets 114; Potassium 4.2; Sodium 134    Lipid Panel    Component Value Date/Time   CHOL 181 07/18/2016 0253   TRIG 93 07/18/2016 0253   HDL 56 07/18/2016 0253   CHOLHDL 3.2 07/18/2016 0253   VLDL 19 07/18/2016 0253   LDLCALC 106 (H)  07/18/2016 0253      Wt Readings from Last 3 Encounters:  03/28/21 111 lb (50.3 kg)  06/08/20 105 lb (47.6 kg)  12/28/19 105 lb  1.9 oz (47.7 kg)      Other studies Reviewed: Additional studies/ records that were reviewed today include: . Review of the above records demonstrates:    ASSESSMENT AND PLAN: . 1. Leg edema:   has trace - 1+ leg edema Possibly due to her amoldipine  We will reduce her amlodipine to 2.5 mg a day.  We will start HCTZ 25 mg a day and potassium chloride 10 mg a day.  Added as a matter of convenience we will change her metoprolol to metoprolol succinate 25 mg a day.     2. Back pains  :   3. Mild left carotid artery artery disease : No neurologic symptoms.   4.  HTN:      We are adding HCTZ 25 mg a day and K-Dur 10 mg a day.  We will be decreasing her amlodipine slightly because of some leg edema.  Current medicines are reviewed at length with the patient today.  The patient does not have concerns regarding medicines.  The following changes have been made:  no change  Labs/ tests ordered today include:   Orders Placed This Encounter  Procedures   Basic Metabolic Panel (BMET)   EKG 12-Lead        Kristeen Miss, MD  03/28/2021 6:20 PM    [facility] [address] [place], [place], [place]  [address] Phone: [phone]; Fax: [phone]

## 2021-03-28 NOTE — Patient Instructions (Addendum)
Medication Instructions:  Your physician has recommended you make the following change in your medication:  STOP Lopressor (metoprolol tartrate) START Toprol XL (metoprolol succinate) 25 mg once daily START K-dur (Potassium chloride) 10 mEq once daily in the morning START HCTZ (Hydrochlorothiazide) 25 mg once daily in the morning DECREASE Amlodipine to 2.5 mg once daily   *If you need a refill on your cardiac medications before your next appointment, please call your pharmacy*   Lab Work: Your physician recommends that you return for lab work in: 3 weeks on Wednesday August 24 at 2:15 pm You do not have to FAST for this appointment  If you have labs (blood work) drawn today and your tests are completely normal, you will receive your results only by: MyChart Message (if you have MyChart) OR A paper copy in the mail If you have any lab test that is abnormal or we need to change your treatment, we will call you to review the results.   Testing/Procedures: None Ordered   Follow-Up: At [facility], you and your health needs are our priority.  As part of our continuing mission to provide you with exceptional heart care, we have created designated Provider Care Teams.  These Care Teams include your primary Cardiologist (physician) and Advanced Practice Providers (APPs -  Physician Assistants and Nurse Practitioners) who all work together to provide you with the care you need, when you need it.  We recommend signing up for the patient portal called "MyChart".  Sign up information is provided on this After Visit Summary.  MyChart is used to connect with patients for Virtual Visits (Telemedicine).  Patients are able to view lab/test results, encounter notes, upcoming appointments, etc.  Non-urgent messages can be sent to your provider as well.   To learn more about what you can do with MyChart, go to [email or web address].    Your next appointment:   3 month(s)  The format for your next  appointment:   In Person  Provider:   Tereso Newcomer, PA-C   Other Instructions    For your  leg edema you  should do  the following 1. Leg elevation - I recommend the Lounge Dr. Leg rest.  See below for details  2. Salt restriction  -  Use potassium chloride instead of regular salt as a salt substitute. 3. Walk regularly 4. Compression hose - [facility] 5. Weight loss    Available on [email or web address] Or  Go to [email or web address]

## 2021-03-29 ENCOUNTER — Telehealth: Payer: Self-pay | Admitting: Cardiovascular Disease

## 2021-03-29 MED ORDER — HYDROCHLOROTHIAZIDE 25 MG PO TABS
25.0000 mg | ORAL_TABLET | Freq: Every day | ORAL | 3 refills | Status: DC
Start: 2021-03-29 — End: 2021-04-26

## 2021-03-29 NOTE — Telephone Encounter (Signed)
I spoke with patient and went over medication changes made yesterday with her.  Patient reports Dr Timothy Lasso decreased her losartan to 50 mg daily about 6 months ago. Will update med list. Patient requests HCTZ be sent to local pharmacy while she waits for mail order shipment to arrive.  She does not need local prescriptions for potassium, metoprolol or amlodipine. Prescription sent to [facility] on [address]

## 2021-03-29 NOTE — Telephone Encounter (Signed)
Pt is calling with questions in regards to her medication  

## 2021-04-13 ENCOUNTER — Telehealth: Payer: Self-pay | Admitting: Cardiovascular Disease

## 2021-04-13 NOTE — Telephone Encounter (Signed)
Pt is calling with concrns about her medical health.Pt is stating she needs to speak with the nurse

## 2021-04-13 NOTE — Telephone Encounter (Signed)
Spoke with the patient who reports that Dr. Elease Hashimoto started her on HCTZ at her last office visit. It caused her confusion so she has stopped taking it. She reports confusion has resolved. She states that she has also stopped taking potassium. Spoke with patient's caregiver and she continues to take amlodipine 2.5 mg daily, Toprol 25 mg daily and losartan 50 mg daily. Caregiver would like to know if the patient should go back to taking amlodipine BID.  She states her blood pressure have been 130-140s/70s.

## 2021-04-13 NOTE — Telephone Encounter (Signed)
Left message to call back  

## 2021-04-13 NOTE — Telephone Encounter (Signed)
Spoke to the patient about Dr. Harvie Bridge recommendation regarding medication based on BP readings. The patient became upset stating that her BP has been running higher than 140-130's/70 and that her caregiver did not tell us correctly. I advised the patient to take her medication as prescribed, amlodipine 2.5 mg daily, Toprol 25 mg daily and losartan 50 mg daily. Record her BP over the weekend before she takes the medication in the morning and take it again around lunch time a few hours later. Write those readings down and IF they are high to call the office next week with the readings, so Dr. Melburn Popper can adjust her medications if needed. Advised that if her BP is too high over the weekend to call the weekend on call to get guidance on what to do, while not recommending taking extra medication by herself. Gave her ED precautions.

## 2021-04-18 ENCOUNTER — Other Ambulatory Visit: Payer: Self-pay

## 2021-04-18 ENCOUNTER — Other Ambulatory Visit: Payer: Medicare Other | Admitting: *Deleted

## 2021-04-18 DIAGNOSIS — I10 Essential (primary) hypertension: Secondary | ICD-10-CM

## 2021-04-18 DIAGNOSIS — I350 Nonrheumatic aortic (valve) stenosis: Secondary | ICD-10-CM

## 2021-04-18 DIAGNOSIS — I340 Nonrheumatic mitral (valve) insufficiency: Secondary | ICD-10-CM

## 2021-04-18 DIAGNOSIS — M7989 Other specified soft tissue disorders: Secondary | ICD-10-CM

## 2021-04-18 NOTE — Telephone Encounter (Signed)
Pt came by the office with BP readings. They ranged between 131/74 to 164/87. A one time high reading of 172/87 on 8/20. Pt states she doesn't understand why she cannot take her amlodipine 2.5mg  bid. She also did not like the HCTZ, so she has only taken it once. She stated it made her forgetful and "cloudy headed."  So today, she insisted on speaking with Dr. Elease Hashimoto. Her main concern is a one time diastolic reading of 95. Unfortunately he was not available.   She wants to report she is taking her BP medications as follows: Amlodipine 2.5mg  bid Losartan 50mg  qAM Metoprolol Succinate 25mg  qAM  She discontinued taking HCTZ and K+ supplement.   I will forward to Dr. for follow up as pt has requested a phone call from him personally.

## 2021-04-19 LAB — BASIC METABOLIC PANEL
BUN/Creatinine Ratio: 10 — ABNORMAL LOW (ref 12–28)
BUN: 8 mg/dL — ABNORMAL LOW (ref 10–36)
CO2: 22 mmol/L (ref 20–29)
Calcium: 10 mg/dL (ref 8.7–10.3)
Chloride: 99 mmol/L (ref 96–106)
Creatinine, Ser: 0.8 mg/dL (ref 0.57–1.00)
Glucose: 103 mg/dL — ABNORMAL HIGH (ref 65–99)
Potassium: 4.3 mmol/L (ref 3.5–5.2)
Sodium: 137 mmol/L (ref 134–144)
eGFR: 68 mL/min/{1.73_m2} (ref >59)

## 2021-04-26 ENCOUNTER — Telehealth: Payer: Self-pay | Admitting: Nurse Practitioner

## 2021-04-26 NOTE — Telephone Encounter (Signed)
-----   Message from Vesta Mixer, MD sent at 04/19/2021  2:23 PM EDT ----- BMP is stable. I tried calling about her concern with her mild BP elevations.  I got her daughters VM. Will try back later

## 2021-04-26 NOTE — Telephone Encounter (Signed)
Spoke with patient who states she again stopped the hctz and potassium due to nausea and diarrhea. She is monitoring BP at home with the help of a CNA and her daughter. BP readings have been 132/78, 142/66, and an occasional slightly higher reading. She states she feels better. She is taking amlodipine 2.5 mg bid, losartan 50 mg daily, and metoprolol 25 mg daily. I advised her to continue current medical therapy and to call back if she sees systolic BP readings consistently > 150 mmHg. She verbalized understanding and agreement and thanked me for the call.

## 2021-05-04 IMAGING — CT CT HEAD W/O CM
3 series · 15 of 45 positions shown, 18 images · non-contrast
Comparison: 09/14/2018

CLINICAL DATA: Altered mental status

EXAM:
CT HEAD WITHOUT CONTRAST
TECHNIQUE: Contiguous axial images were obtained from the base of the skull
through the vertex without intravenous contrast.

[Series 2: head wo · axial · 0.41mm/px · z∈[+1342,+1457]mm · 9 of 28 slices shown, 12 images]
[im 3/28  brain]
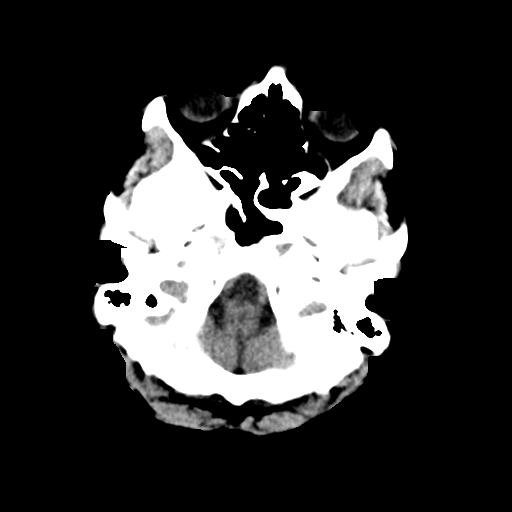
[im 3/28  bone]
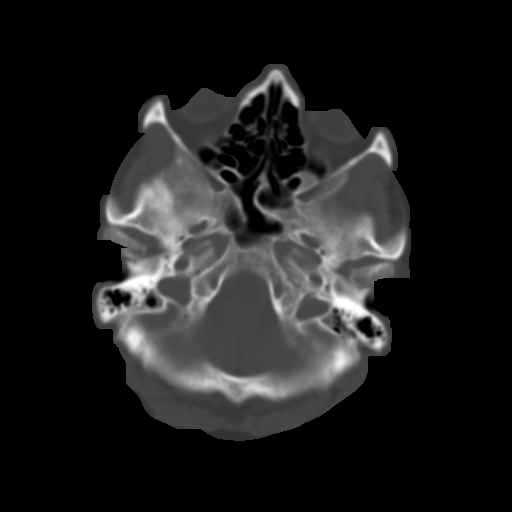
[im 6/28  brain]
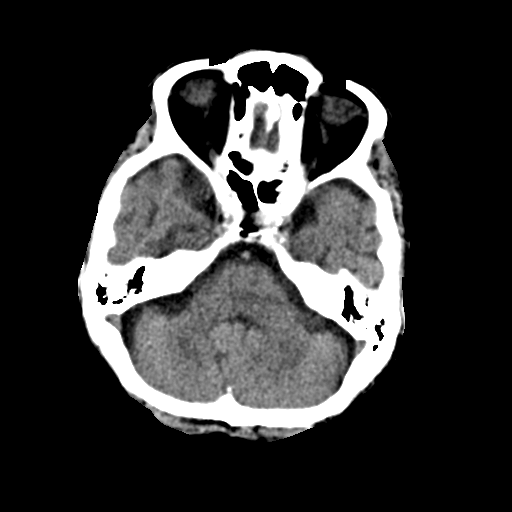
[im 9/28  brain]
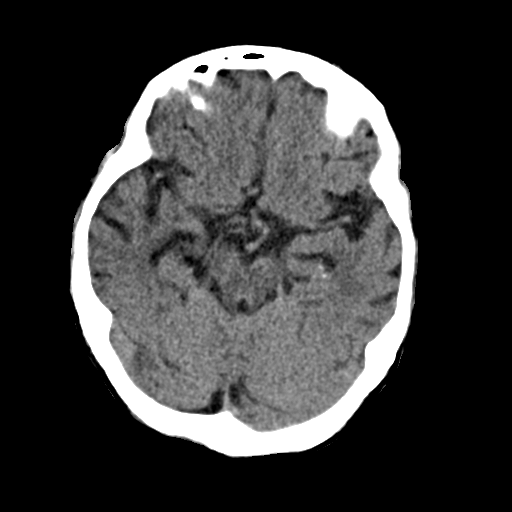
[im 12/28  brain]
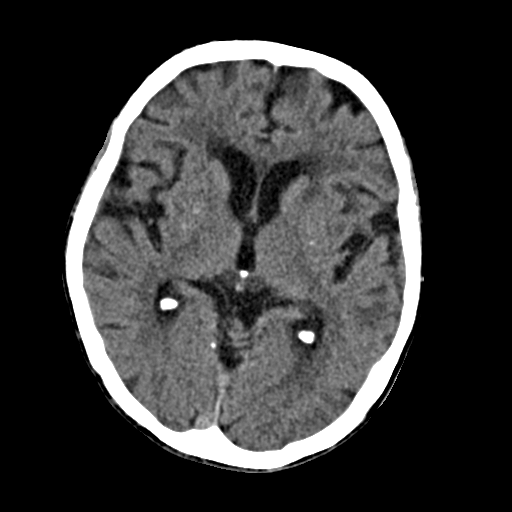
[im 15/28  brain]
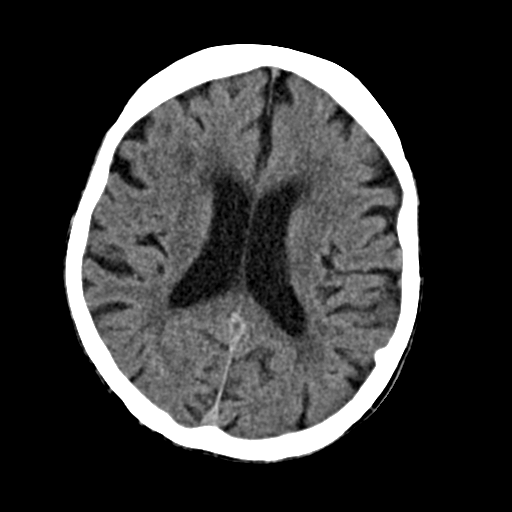
[im 15/28  bone]
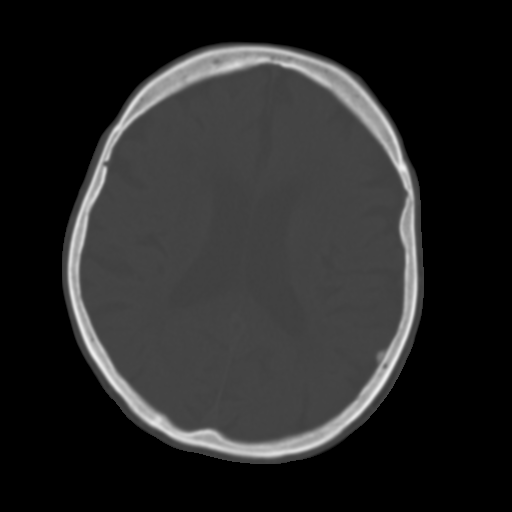
[im 17/28  brain]
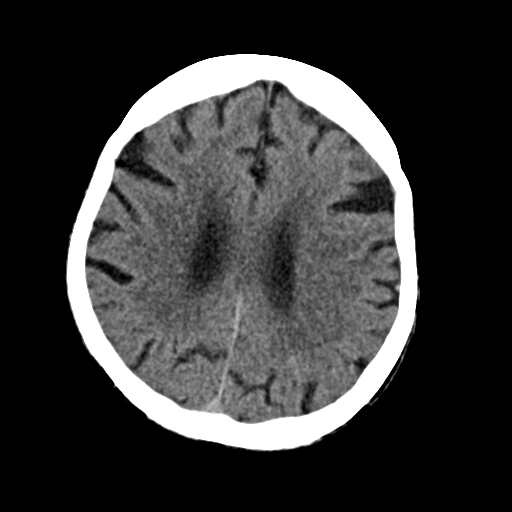
[im 20/28  brain]
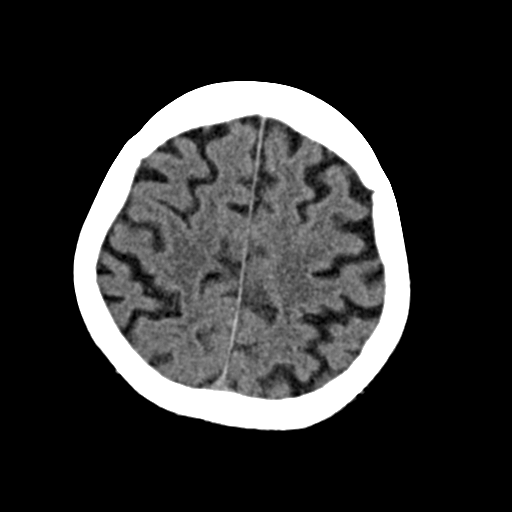
[im 23/28  brain]
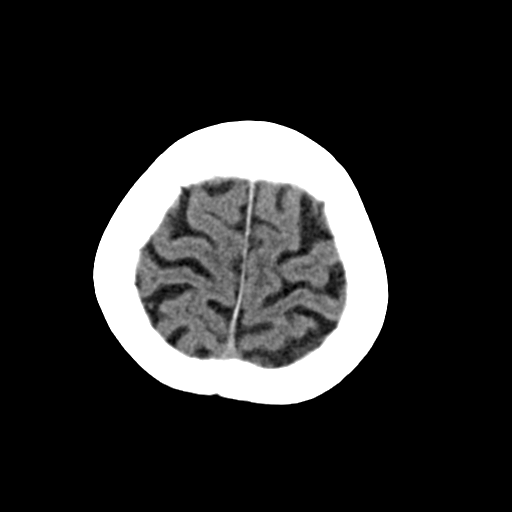
[im 26/28  brain]
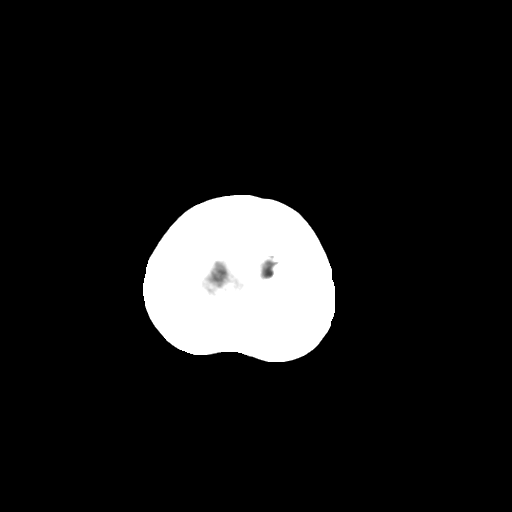
[im 26/28  bone]
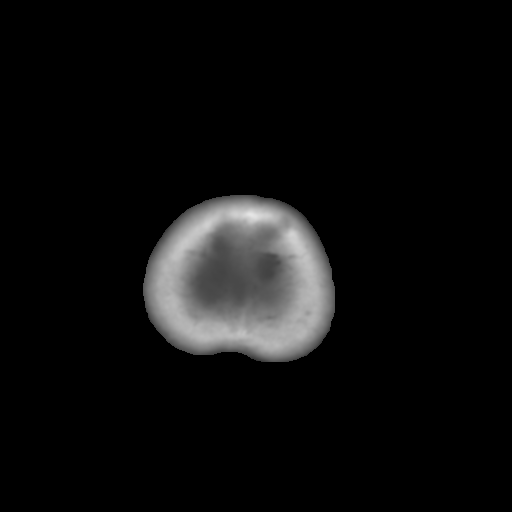

[Series 4: coronal soft · coronal · 0.27mm/px · 3 of 67 slices shown]
[im 23/67  brain]
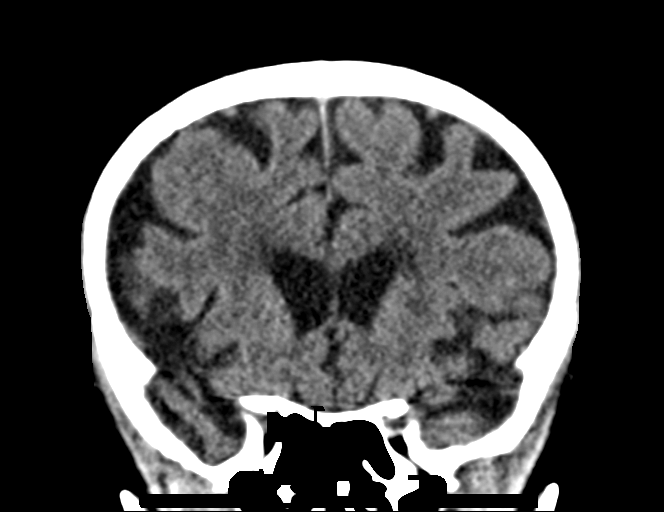
[im 30/67  brain]
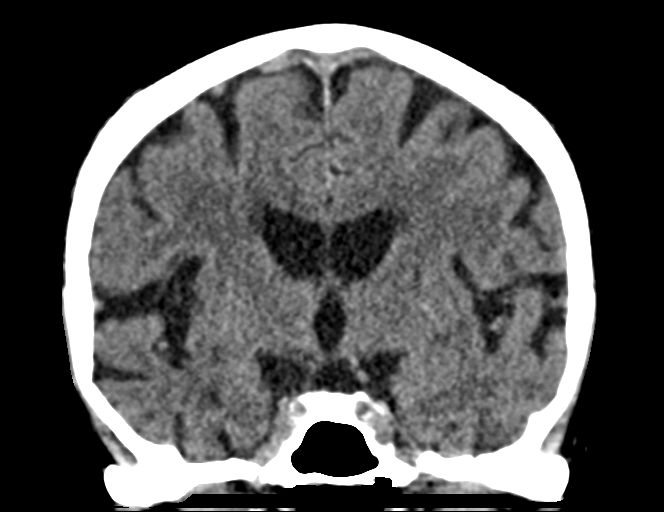
[im 37/67  brain]
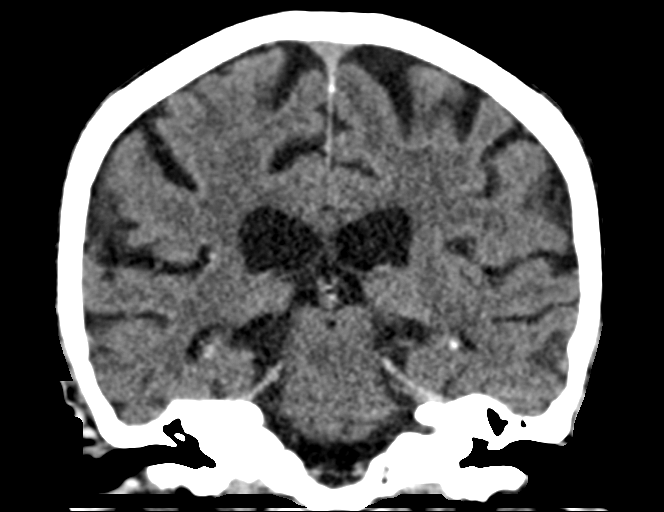

[Series 5: sag soft · sagittal · 0.27mm/px · 3 of 58 slices shown]
[im 20/58  brain]
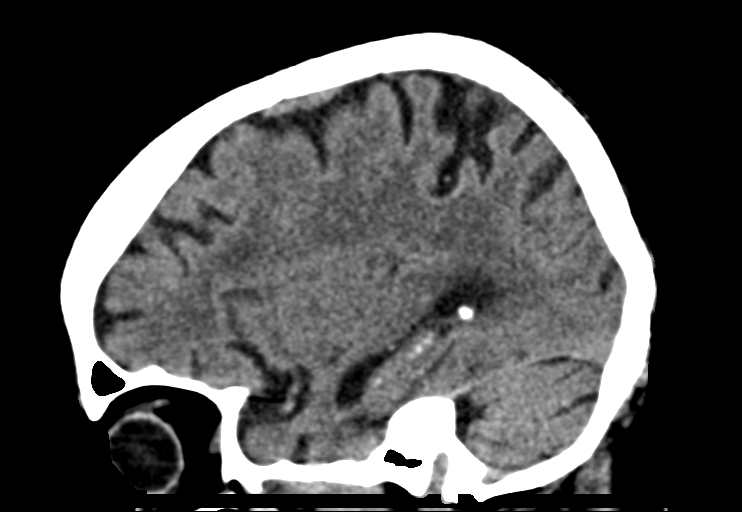
[im 29/58  brain]
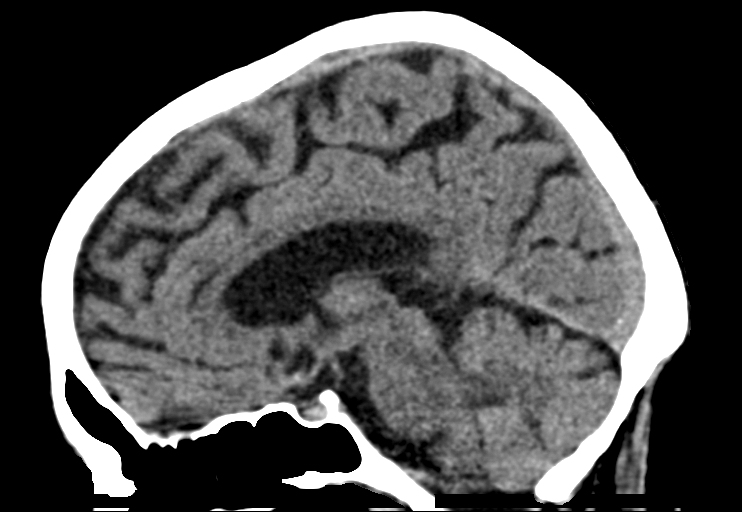
[im 39/58  brain]
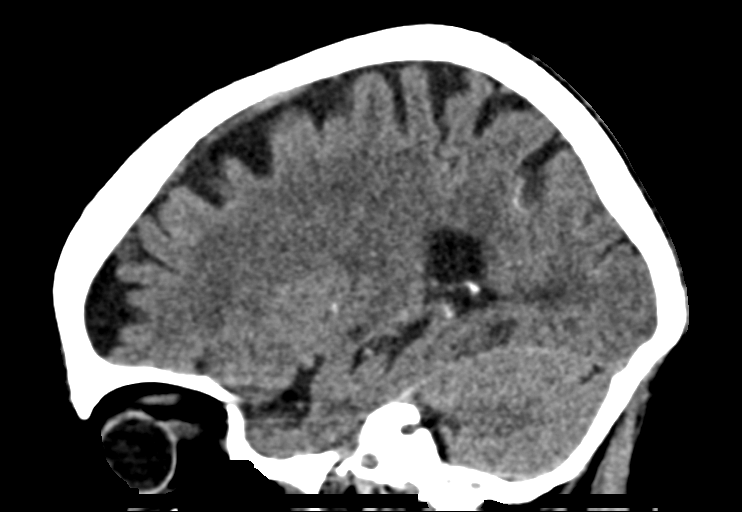

[15 of 45 positions shown; findings below may reference images not displayed]

FINDINGS: Brain: Chronic atrophic changes and white matter ischemic changes
are again noted and stable. Findings to suggest acute hemorrhage,
acute infarction or space-occupying mass lesion are seen. Scattered
parenchymal calcifications are noted.

Vascular: No hyperdense vessel or unexpected calcification.

Skull: Normal. Negative for fracture or focal lesion.

Sinuses/Orbits: No acute finding.

Other: None.
IMPRESSION: Chronic atrophic and ischemic changes without acute abnormality.

## 2021-07-02 ENCOUNTER — Ambulatory Visit: Payer: Medicare Other | Admitting: Physician Assistant

## 2021-07-05 ENCOUNTER — Telehealth: Payer: Self-pay | Admitting: Cardiovascular Disease

## 2021-07-05 NOTE — Telephone Encounter (Signed)
Patient wants to know if she can change her appt that she has for tomorrow to a virtual appt do to the impending weather.

## 2021-07-06 ENCOUNTER — Ambulatory Visit: Payer: Medicare Other | Admitting: Cardiovascular Disease

## 2021-07-06 NOTE — Telephone Encounter (Signed)
Spoke with pt to advise per Dr Elease Hashimoto OK to change appointment to virtual visit.  Pt states she wishes to cancel appointment today due to being up all night with vomiting and diarrhea.  Pt states she will call to reschedule.

## 2021-07-27 ENCOUNTER — Ambulatory Visit (INDEPENDENT_AMBULATORY_CARE_PROVIDER_SITE_OTHER): Payer: Medicare Other | Admitting: Cardiovascular Disease

## 2021-07-27 ENCOUNTER — Other Ambulatory Visit: Payer: Self-pay

## 2021-07-27 ENCOUNTER — Encounter: Payer: Self-pay | Admitting: Cardiovascular Disease

## 2021-07-27 VITALS — BP 138/64 | HR 65 | Ht 59.0 in | Wt 118.8 lb

## 2021-07-27 DIAGNOSIS — I340 Nonrheumatic mitral (valve) insufficiency: Secondary | ICD-10-CM

## 2021-07-27 DIAGNOSIS — I10 Essential (primary) hypertension: Secondary | ICD-10-CM

## 2021-07-27 NOTE — Progress Notes (Signed)
Cardiology Office Note   Date:  07/27/2021   ID:  Melanie [name], DOB 05-31-1924, MRN [id number]  PCP:  Creola Corn, MD  Cardiologist:   Kristeen Miss, MD   Chief Complaint  Patient presents with   Hypertension         Problem List 1. Mitral regurgitation  2. Back pains  3. Mild left carotid artery artery disease    Previous notes.  Melanie [name] is a 87 y.o. female who presents for evaluation of a new heart murmur .  She reports that she is here for a regular check up - no CP or dyspnea.  She used to see Dr. [name]  ( moved to [place], [place])  Brought a record of her BP and HR readings - all look very good.   She has had an echo in Dr. [name] office. Normal LV function . Possible PFO. , trace AI  Mild MR  BP and HR is very well controlled  Nov. 16, 2017:     Melanie [name] is seen back for a follow up visit after a year.  Her husband passed away this past Feb 05, 2023.   Awoke with some heart flutters ,   BP was a little elevated BP is great .  November 26, 2017: Melanie [name] seen back today for follow-up of her mild mitral regurgitation and hypertension. Was seen by [name] in July for palpitations .   No arrhythmias found  Has been to the ER several times.  - once for a UTI Another for reaction to a med - ? Flu shot   Not so much exercise recently but is active.   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  Has mild AS, mil dMR, mild TR   Nov. 4 2020  Has some swelling in her ankles at night. Better by morning.  She has a history of hypertension and I see that she is on amlodipine 2.5 mg twice a day.  She is on potassium chloride but is not on a diuretic.  Her potassium was low at her last BMP   Dr. Timothy Lasso has prescribed her the potassium   Dec 28, 2019:  Doing well  Has some occasional upper gastric pressure Occurs when she is sitting after eating .  Is not related to exertion   March 28, 2021: Seen with daughter, Melanie [name]  is seen today for further evaluation of some leg edema. She has not been eating much salt.  She does not elevate her legs.  We discussed leg elevation.  She is on amlodipine which could be causing her leg edema to be worse than it otherwise would be. Home BP readings are elevated on occasion BP here today is good  Avoids salt and salty foods.   Past Medical History:  Diagnosis Date   Bruises easily    on Aspirin;on hold for surgery   Carotid stenosis    CHF (congestive heart failure) (HCC)    diastolic dysfunction   Chronic back pain    stenosis   Chronic sinusitis    Diverticulosis    Esophageal stricture    GERD (gastroesophageal reflux disease)    takes Protonix daily   Heart murmur    as a child   Hemorrhoids    History of colon polyps    Hypertension    takes Amlodipine,HCTZ,and Metoprolol   Insomnia    Mitral insufficiency and aortic stenosis  Palpitations    Peripheral edema    Schatzki's ring    Scoliosis    Stroke [facility])     Past Surgical History:  Procedure Laterality Date   BREAST ENHANCEMENT SURGERY     CHOLECYSTECTOMY     COLONOSCOPY     DILATION AND CURETTAGE OF UTERUS     epidural injections     ESOPHAGOGASTRODUODENOSCOPY     EYE SURGERY     left cataract removed   LUMBAR LAMINECTOMY/DECOMPRESSION MICRODISCECTOMY Left 01/26/2013   Procedure: LUMBAR LAMINECTOMY/DECOMPRESSION MICRODISCECTOMY 1 LEVEL;  Surgeon: Barnett Abu, MD;  Location: [facility];  Service: Neurosurgery;  Laterality: Left;  Left Lumbar four-five Laminectomy/Foraminotomy   steroid injections     x 3   TOTAL HIP ARTHROPLASTY Right      Current Outpatient Medications  Medication Sig Dispense Refill   amLODipine (NORVASC) 2.5 MG tablet Take 2.5 mg by mouth 2 (two) times daily.     celecoxib (CELEBREX) 200 MG capsule Take 200 mg by mouth as needed.     gabapentin (NEURONTIN) 100 MG capsule Take 1 capsule by mouth at bedtime.     losartan (COZAAR) 50 MG tablet Take 50  mg by mouth daily.     metoprolol succinate (TOPROL XL) 25 MG 24 hr tablet Take 1 tablet (25 mg total) by mouth daily. 90 tablet 3   Multiple Vitamin (MULTIVITAMIN WITH MINERALS) TABS tablet Take 1 tablet by mouth daily.     pantoprazole (PROTONIX) 40 MG tablet Take 40 mg by mouth daily.     Tetrahydrozoline HCl (VISINE OP) Place 1 drop into both eyes daily as needed (dry eyes).     traMADol (ULTRAM) 50 MG tablet Take 50 mg by mouth as needed.     No current facility-administered medications for this visit.    Allergies:   Ciprofloxacin    Social History:  The patient  reports that she has never smoked. She has never used smokeless tobacco. She reports that she does not drink alcohol and does not use drugs.   Family History:  The patient's family history includes CAD in her brother; Lung cancer in her brother; Renal cancer in her sister; Rheum arthritis in her mother.    ROS:  Please see the history of present illness.     Physical Exam: Blood pressure 138/64, pulse 65, height 4\' 11"  (1.499 m), weight 118 lb 12.8 oz (53.9 kg), SpO2 96 %.  GEN:  Well nourished, well developed in no acute distress HEENT: Normal NECK: No JVD;  soft left carotid bruit  LYMPHATICS: No lymphadenopathy CARDIAC: RRR , soft systolic murmur  RESPIRATORY:  Clear to auscultation without rales, wheezing or rhonchi  ABDOMEN: Soft, non-tender, non-distended MUSCULOSKELETAL: trace - 1+ edema ; No deformity  SKIN: Warm and dry NEUROLOGIC:  Alert and oriented x 3  EKG:       Recent Labs: 04/18/2021: BUN 8; Creatinine, Ser 0.80; Potassium 4.3; Sodium 137    Lipid Panel    Component Value Date/Time   CHOL 181 07/18/2016 0253   TRIG 93 07/18/2016 0253   HDL 56 07/18/2016 0253   CHOLHDL 3.2 07/18/2016 0253   VLDL 19 07/18/2016 0253   LDLCALC 106 (H) 07/18/2016 0253      Wt Readings from Last 3 Encounters:  07/27/21 118 lb 12.8 oz (53.9 kg)  03/28/21 111 lb (50.3 kg)  06/08/20 105 lb (47.6 kg)       Other studies Reviewed: Additional studies/ records that were reviewed today include: .  Review of the above records demonstrates:    ASSESSMENT AND PLAN: . 1. Leg edema: has only trace edema She did not tolerate the HCTZ . Advised leg elevateion       2. Back pains  :   3. Mild left carotid artery artery disease :     4.  HTN:     Blood pressures fairly overall well controlled.  She has occasional elevated blood pressure readings.  I explained to her that I would rather her have occasional isolated elevated blood pressure readings rather than having isolated low blood pressure readings.  She would be at risk of having orthostatic hypotension and potentially falling and breaking a hip   She will return to see an APP in 6 months.  I will plan on seeing her in 1 year.   Current medicines are reviewed at length with the patient today.  The patient does not have concerns regarding medicines.  The following changes have been made:  no change  Labs/ tests ordered today include:   No orders of the defined types were placed in this encounter.       Kristeen Miss, MD  07/27/2021 5:19 PM    [facility] [address] [place], [place], [place]  [address] Phone: [phone]; Fax: [phone]

## 2021-07-27 NOTE — Patient Instructions (Signed)
Medication Instructions:  Your physician recommends that you continue on your current medications as directed. Please refer to the Current Medication list given to you today.  *If you need a refill on your cardiac medications before your next appointment, please call your pharmacy*   Lab Work: NONE If you have labs (blood work) drawn today and your tests are completely normal, you will receive your results only by: MyChart Message (if you have MyChart) OR A paper copy in the mail If you have any lab test that is abnormal or we need to change your treatment, we will call you to review the results.   Testing/Procedures: NONE   Follow-Up: At [facility], you and your health needs are our priority.  As part of our continuing mission to provide you with exceptional heart care, we have created designated Provider Care Teams.  These Care Teams include your primary Cardiologist (physician) and Advanced Practice Providers (APPs -  Physician Assistants and Nurse Practitioners) who all work together to provide you with the care you need, when you need it.  We recommend signing up for the patient portal called "MyChart".  Sign up information is provided on this After Visit Summary.  MyChart is used to connect with patients for Virtual Visits (Telemedicine).  Patients are able to view lab/test results, encounter notes, upcoming appointments, etc.  Non-urgent messages can be sent to your provider as well.   To learn more about what you can do with MyChart, go to [email or web address].    Your next appointment:   6 month(s)  The format for your next appointment:   In Person  Provider:   Chelsea Aus, PA-C or Tereso Newcomer, PA-C     Then, Kristeen Miss, MD will plan to see you again in 1 year(s).{    Other Instructions  For your  leg edema you  should do  the following 1. Leg elevation - I recommend the Lounge Dr. Leg rest.  See below for details  2. Salt restriction  -  Use potassium  chloride instead of regular salt as a salt substitute. 3. Walk regularly 4. Compression hose - [facility] [employer] 5. Weight loss    Available on [email or web address] Or  Go to [email or web address]

## 2021-09-10 ENCOUNTER — Encounter: Payer: Self-pay | Admitting: Family Medicine

## 2021-09-12 ENCOUNTER — Ambulatory Visit: Payer: Medicare Other | Admitting: Nurse Practitioner

## 2021-09-21 ENCOUNTER — Ambulatory Visit: Payer: Medicare Other | Admitting: Nurse Practitioner

## 2021-09-25 ENCOUNTER — Other Ambulatory Visit: Payer: Self-pay | Admitting: Internal Medicine

## 2021-09-25 DIAGNOSIS — R112 Nausea with vomiting, unspecified: Secondary | ICD-10-CM

## 2021-10-05 ENCOUNTER — Ambulatory Visit
Admission: RE | Admit: 2021-10-05 | Discharge: 2021-10-05 | Disposition: A | Discharge status: Home / Self Care | Payer: Medicare PPO | Source: Ambulatory Visit | Attending: Internal Medicine | Admitting: Internal Medicine

## 2021-10-05 DIAGNOSIS — R112 Nausea with vomiting, unspecified: Secondary | ICD-10-CM

## 2021-10-25 ENCOUNTER — Other Ambulatory Visit: Payer: Self-pay | Admitting: Physician Assistant

## 2021-10-25 DIAGNOSIS — R1310 Dysphagia, unspecified: Secondary | ICD-10-CM

## 2021-10-25 DIAGNOSIS — K5900 Constipation, unspecified: Secondary | ICD-10-CM | POA: Diagnosis not present

## 2021-10-25 DIAGNOSIS — K219 Gastro-esophageal reflux disease without esophagitis: Secondary | ICD-10-CM | POA: Diagnosis not present

## 2021-10-30 ENCOUNTER — Ambulatory Visit
Admission: RE | Admit: 2021-10-30 | Discharge: 2021-10-30 | Disposition: A | Discharge status: Home / Self Care | Payer: Medicare PPO | Source: Ambulatory Visit | Attending: Physician Assistant | Admitting: Physician Assistant

## 2021-10-30 DIAGNOSIS — R1310 Dysphagia, unspecified: Secondary | ICD-10-CM | POA: Diagnosis not present

## 2021-10-30 DIAGNOSIS — K224 Dyskinesia of esophagus: Secondary | ICD-10-CM | POA: Diagnosis not present

## 2022-02-18 ENCOUNTER — Observation Stay (HOSPITAL_COMMUNITY)
Admission: EM | Admit: 2022-02-18 | Discharge: 2022-02-19 | Disposition: A | Discharge status: Home / Self Care | Payer: Medicare Other | Attending: Internal Medicine | Admitting: Internal Medicine

## 2022-02-18 ENCOUNTER — Emergency Department (HOSPITAL_COMMUNITY): Payer: Medicare Other

## 2022-02-18 ENCOUNTER — Other Ambulatory Visit: Payer: Self-pay

## 2022-02-18 ENCOUNTER — Encounter (HOSPITAL_COMMUNITY): Payer: Self-pay | Admitting: Internal Medicine

## 2022-02-18 DIAGNOSIS — I38 Endocarditis, valve unspecified: Secondary | ICD-10-CM | POA: Diagnosis present

## 2022-02-18 DIAGNOSIS — I340 Nonrheumatic mitral (valve) insufficiency: Secondary | ICD-10-CM | POA: Insufficient documentation

## 2022-02-18 DIAGNOSIS — Z96641 Presence of right artificial hip joint: Secondary | ICD-10-CM | POA: Insufficient documentation

## 2022-02-18 DIAGNOSIS — Z8673 Personal history of transient ischemic attack (TIA), and cerebral infarction without residual deficits: Secondary | ICD-10-CM | POA: Diagnosis not present

## 2022-02-18 DIAGNOSIS — R55 Syncope and collapse: Secondary | ICD-10-CM | POA: Diagnosis present

## 2022-02-18 DIAGNOSIS — R112 Nausea with vomiting, unspecified: Secondary | ICD-10-CM | POA: Diagnosis present

## 2022-02-18 DIAGNOSIS — I509 Heart failure, unspecified: Secondary | ICD-10-CM | POA: Insufficient documentation

## 2022-02-18 DIAGNOSIS — E876 Hypokalemia: Secondary | ICD-10-CM | POA: Insufficient documentation

## 2022-02-18 DIAGNOSIS — I110 Hypertensive heart disease with heart failure: Secondary | ICD-10-CM | POA: Diagnosis not present

## 2022-02-18 DIAGNOSIS — I10 Essential (primary) hypertension: Secondary | ICD-10-CM | POA: Diagnosis not present

## 2022-02-18 DIAGNOSIS — R197 Diarrhea, unspecified: Secondary | ICD-10-CM

## 2022-02-18 DIAGNOSIS — G451 Carotid artery syndrome (hemispheric): Secondary | ICD-10-CM | POA: Insufficient documentation

## 2022-02-18 DIAGNOSIS — Z79899 Other long term (current) drug therapy: Secondary | ICD-10-CM | POA: Insufficient documentation

## 2022-02-18 LAB — URINALYSIS, ROUTINE W REFLEX MICROSCOPIC
Bilirubin Urine: NEGATIVE
Glucose, UA: NEGATIVE mg/dL
Hgb urine dipstick: NEGATIVE
Ketones, ur: NEGATIVE mg/dL
Nitrite: NEGATIVE
Protein, ur: NEGATIVE mg/dL
Specific Gravity, Urine: 1.002 — ABNORMAL LOW (ref 1.005–1.030)
pH: 8 (ref 5.0–8.0)

## 2022-02-18 LAB — BASIC METABOLIC PANEL
Anion gap: 8 (ref 5–15)
BUN: <5 mg/dL — ABNORMAL LOW (ref 8–23)
CO2: 27 mmol/L (ref 22–32)
Calcium: 9.2 mg/dL (ref 8.9–10.3)
Chloride: 102 mmol/L (ref 98–111)
Creatinine, Ser: 0.86 mg/dL (ref 0.44–1.00)
GFR, Estimated: >60 mL/min (ref >60)
Glucose, Bld: 114 mg/dL — ABNORMAL HIGH (ref 70–99)
Potassium: 3.2 mmol/L — ABNORMAL LOW (ref 3.5–5.1)
Sodium: 137 mmol/L (ref 135–145)

## 2022-02-18 LAB — CBG MONITORING, ED: Glucose-Capillary: 116 mg/dL — ABNORMAL HIGH (ref 70–99)

## 2022-02-18 LAB — CBC
HCT: 38 % (ref 36.0–46.0)
Hemoglobin: 13.2 g/dL (ref 12.0–15.0)
MCH: 31.9 pg (ref 26.0–34.0)
MCHC: 34.7 g/dL (ref 30.0–36.0)
MCV: 91.8 fL (ref 80.0–100.0)
Platelets: 223 10*3/uL (ref 150–400)
RBC: 4.14 MIL/uL (ref 3.87–5.11)
RDW: 13.6 % (ref 11.5–15.5)
WBC: 5.2 10*3/uL (ref 4.0–10.5)
nRBC: 0 % (ref 0.0–0.2)

## 2022-02-18 LAB — TROPONIN I (HIGH SENSITIVITY)
Troponin I (High Sensitivity): 12 ng/L (ref <18)
Troponin I (High Sensitivity): 13 ng/L (ref <18)

## 2022-02-18 MED ORDER — ENOXAPARIN SODIUM 40 MG/0.4ML IJ SOSY
40.0000 mg | PREFILLED_SYRINGE | INTRAMUSCULAR | Status: DC
  Administered 2022-02-19: 40 mg via SUBCUTANEOUS
  Filled 2022-02-18: qty 0.4

## 2022-02-18 MED ORDER — ONDANSETRON HCL 4 MG PO TABS: 4.0000 mg | ORAL_TABLET | Freq: Four times a day (QID) | ORAL | Status: DC | PRN

## 2022-02-18 MED ORDER — HYDRALAZINE HCL 20 MG/ML IJ SOLN: 5.0000 mg | INTRAMUSCULAR | Status: DC | PRN

## 2022-02-18 MED ORDER — GABAPENTIN 100 MG PO CAPS
100.0000 mg | ORAL_CAPSULE | Freq: Every day | ORAL | Status: DC
  Administered 2022-02-18: 100 mg via ORAL
  Filled 2022-02-18: qty 1

## 2022-02-18 MED ORDER — PANTOPRAZOLE SODIUM 40 MG PO TBEC
40.0000 mg | DELAYED_RELEASE_TABLET | Freq: Every evening | ORAL | Status: DC
  Administered 2022-02-18: 40 mg via ORAL
  Filled 2022-02-18: qty 1

## 2022-02-18 MED ORDER — ACETAMINOPHEN 325 MG PO TABS: 650.0000 mg | ORAL_TABLET | Freq: Four times a day (QID) | ORAL | Status: DC | PRN

## 2022-02-18 MED ORDER — AMLODIPINE BESYLATE 2.5 MG PO TABS
2.5000 mg | ORAL_TABLET | Freq: Two times a day (BID) | ORAL | Status: DC
  Administered 2022-02-18 – 2022-02-19 (×2): 2.5 mg via ORAL
  Filled 2022-02-18 (×2): qty 1

## 2022-02-18 MED ORDER — ONDANSETRON HCL 4 MG/2ML IJ SOLN
4.0000 mg | Freq: Four times a day (QID) | INTRAMUSCULAR | Status: DC | PRN
  Administered 2022-02-18: 4 mg via INTRAVENOUS
  Filled 2022-02-18: qty 2

## 2022-02-18 MED ORDER — ADULT MULTIVITAMIN W/MINERALS CH
1.0000 | ORAL_TABLET | Freq: Every day | ORAL | Status: DC
Start: 2022-02-18 — End: 2022-02-19
  Administered 2022-02-18 – 2022-02-19 (×2): 1 via ORAL
  Filled 2022-02-18 (×2): qty 1

## 2022-02-18 MED ORDER — POTASSIUM CHLORIDE CRYS ER 20 MEQ PO TBCR
40.0000 meq | EXTENDED_RELEASE_TABLET | Freq: Once | ORAL | Status: AC
  Administered 2022-02-18: 40 meq via ORAL
  Filled 2022-02-18: qty 2

## 2022-02-18 MED ORDER — SODIUM CHLORIDE 0.9% FLUSH
3.0000 mL | Freq: Two times a day (BID) | INTRAVENOUS | Status: DC
  Administered 2022-02-18 – 2022-02-19 (×2): 3 mL via INTRAVENOUS

## 2022-02-18 MED ORDER — ACETAMINOPHEN 650 MG RE SUPP: 650.0000 mg | Freq: Four times a day (QID) | RECTAL | Status: DC | PRN

## 2022-02-18 MED ORDER — METOPROLOL TARTRATE 50 MG PO TABS
50.0000 mg | ORAL_TABLET | Freq: Every day | ORAL | Status: DC
  Administered 2022-02-18: 50 mg via ORAL
  Filled 2022-02-18 (×2): qty 1

## 2022-02-18 MED ORDER — LACTATED RINGERS IV BOLUS
1000.0000 mL | Freq: Once | INTRAVENOUS | Status: AC
  Administered 2022-02-18: 1000 mL via INTRAVENOUS

## 2022-02-18 MED ORDER — LOSARTAN POTASSIUM 50 MG PO TABS
50.0000 mg | ORAL_TABLET | Freq: Every day | ORAL | Status: DC
  Administered 2022-02-19: 50 mg via ORAL
  Filled 2022-02-18: qty 1

## 2022-02-18 MED ORDER — LACTATED RINGERS IV SOLN: INTRAVENOUS | Status: DC

## 2022-02-18 NOTE — Assessment & Plan Note (Addendum)
Most likely precipitated by dehydration from N/V/D this past week. DDx includes syncope secondary to AS.  Has h/o Mod AS on 2017 echo. 1. Syncope pathway 2. IVF 1L bolus in ED 1. BP now 174/99, will hold off on further IVF for the moment 3. Tele monitor 4. 2d echo ordered

## 2022-02-18 NOTE — Assessment & Plan Note (Addendum)
Most likely infectious gastroenteritis. Symptoms improved since onset 1 wk ago. Symptoms already improved per patient 1. IVF - 1L bolus in ED 2. zofran 3. Supportive care for now

## 2022-02-18 NOTE — Assessment & Plan Note (Signed)
As of 2017 echo: Moderate AS, mild MR, mild TR.  Grade 1 DD. Repeat 2D echo ordered for syncope

## 2022-02-18 NOTE — H&P (Addendum)
History and Physical    Patient: Melanie [name] [id number]:[id number] DOB: [birth year] DOA: 02/18/2022 DOS: the patient was seen and examined on 02/18/2022 PCP: Creola Corn, MD  Patient coming from: Home  Chief Complaint:  Chief Complaint  Patient presents with   Loss of Consciousness   HPI: Melanie [name] is a 88 y.o. female with medical history significant of dCHF (grade 1 DD, Mod AS), stroke, carotid stenosis, HTN.  Pt with GI illness this past 1 week: N/V/D.  GI symptoms have improved but still not great PO intake.  Was at optometrist earlier today, in chair, had eyes dilated.  Felt lightheaded and dizzy.  Had syncope.  LOC for 1-2 mins.  Confused after.  Back to baseline 10 min later.  In to ED for syncope.   Review of Systems: As mentioned in the history of present illness. All other systems reviewed and are negative. Past Medical History:  Diagnosis Date   Bruises easily    on Aspirin;on hold for surgery   Carotid stenosis    CHF (congestive heart failure) (HCC)    diastolic dysfunction   Chronic back pain    stenosis   Chronic sinusitis    Diverticulosis    Esophageal stricture    GERD (gastroesophageal reflux disease)    takes Protonix daily   Heart murmur    as a child   Hemorrhoids    History of colon polyps    Hypertension    takes Amlodipine,HCTZ,and Metoprolol   Insomnia    Mitral insufficiency and aortic stenosis    Palpitations    Peripheral edema    Schatzki's ring    Scoliosis    Stroke [facility])    Past Surgical History:  Procedure Laterality Date   BREAST ENHANCEMENT SURGERY     CHOLECYSTECTOMY     COLONOSCOPY     DILATION AND CURETTAGE OF UTERUS     epidural injections     ESOPHAGOGASTRODUODENOSCOPY     EYE SURGERY     left cataract removed   LUMBAR LAMINECTOMY/DECOMPRESSION MICRODISCECTOMY Left 01/26/2013   Procedure: LUMBAR LAMINECTOMY/DECOMPRESSION MICRODISCECTOMY 1 LEVEL;  Surgeon: Barnett Abu, MD;  Location: [facility];  Service:  Neurosurgery;  Laterality: Left;  Left Lumbar four-five Laminectomy/Foraminotomy   steroid injections     x 3   TOTAL HIP ARTHROPLASTY Right    Social History:  reports that she has never smoked. She has never used smokeless tobacco. She reports that she does not drink alcohol and does not use drugs.  Allergies  Allergen Reactions   Ciprofloxacin Other (See Comments)    Altered Mental Status     Family History  Problem Relation Age of Onset   Rheum arthritis Mother    Renal cancer Sister    CAD Brother    Lung cancer Brother     Prior to Admission medications   Medication Sig Start Date End Date Taking? Authorizing Provider  amLODipine (NORVASC) 2.5 MG tablet Take 2.5 mg by mouth 2 (two) times daily.    [provider]  celecoxib (CELEBREX) 200 MG capsule Take 200 mg by mouth as needed. 06/25/19   [provider]  gabapentin (NEURONTIN) 100 MG capsule Take 1 capsule by mouth at bedtime. 06/25/19   [provider]  losartan (COZAAR) 50 MG tablet Take 50 mg by mouth daily.    [provider]  metoprolol succinate (TOPROL XL) 25 MG 24 hr tablet Take 1 tablet (25 mg total) by mouth daily. 03/28/21  Nahser, Deloris Ping, MD  Multiple Vitamin (MULTIVITAMIN WITH MINERALS) TABS tablet Take 1 tablet by mouth daily.    [provider]  pantoprazole (PROTONIX) 40 MG tablet Take 40 mg by mouth daily. 07/15/18   [provider]  Tetrahydrozoline HCl (VISINE OP) Place 1 drop into both eyes daily as needed (dry eyes).    [provider]  traMADol (ULTRAM) 50 MG tablet Take 50 mg by mouth as needed. 06/19/21   [provider]    Physical Exam: Vitals:   02/18/22 1733 02/18/22 1800 02/18/22 1830 02/18/22 2000  BP:  136/74 (!) 158/122 (!) 148/61  Pulse:  65 63 66  Resp: 17 15 (!) 25 20  Temp: 98.3 F (36.8 C)     TempSrc: Oral     SpO2:  (!) 89% 96% 91%   Constitutional: NAD, calm, comfortable Eyes: PERRL, lids and  conjunctivae normal ENMT: Mucous membranes are moist. Posterior pharynx clear of any exudate or lesions.Normal dentition.  Neck: normal, supple, no masses, no thyromegaly Respiratory: clear to auscultation bilaterally, no wheezing, no crackles. Normal respiratory effort. No accessory muscle use.  Cardiovascular: Murmur present Abdomen: no tenderness, no masses palpated. No hepatosplenomegaly. Bowel sounds positive.  Musculoskeletal: no clubbing / cyanosis. No joint deformity upper and lower extremities. Good ROM, no contractures. Normal muscle tone.  Skin: no rashes, lesions, ulcers. No induration Neurologic: CN 2-12 grossly intact. Sensation intact, DTR normal. Strength 5/5 in all 4.  Psychiatric: Normal judgment and insight. Alert and oriented x 3. Normal mood.   Data Reviewed:    CBC    Component Value Date/Time   WBC 5.2 02/18/2022 1808   RBC 4.14 02/18/2022 1808   HGB 13.2 02/18/2022 1808   HCT 38.0 02/18/2022 1808   PLT 223 02/18/2022 1808   MCV 91.8 02/18/2022 1808   MCH 31.9 02/18/2022 1808   MCHC 34.7 02/18/2022 1808   RDW 13.6 02/18/2022 1808   LYMPHSABS 1.4 06/08/2020 1725   MONOABS 0.6 06/08/2020 1725   EOSABS 0.3 06/08/2020 1725   BASOSABS 0.0 06/08/2020 1725   CMP     Component Value Date/Time   NA 137 02/18/2022 1808   NA 137 04/18/2021 1427   K 3.2 (L) 02/18/2022 1808   CL 102 02/18/2022 1808   CO2 27 02/18/2022 1808   GLUCOSE 114 (H) 02/18/2022 1808   BUN <5 (L) 02/18/2022 1808   BUN 8 (L) 04/18/2021 1427   CREATININE 0.86 02/18/2022 1808   CALCIUM 9.2 02/18/2022 1808   PROT 6.6 06/08/2020 1725   ALBUMIN 3.8 06/08/2020 1725   AST 20 06/08/2020 1725   ALT 9 06/08/2020 1725   ALKPHOS 84 06/08/2020 1725   BILITOT 0.3 06/08/2020 1725   GFRNONAA >60 02/18/2022 1808   GFRAA 77 12/01/2019 1450   CXR neg  2D echo from 2017 also reviewed: Preserved EF, grade 1 DD, Mod AS, Mild MR, Mild TR.  Assessment and Plan: * Syncope Most likely precipitated by  dehydration from N/V/D this past week. DDx includes syncope secondary to AS.  Has h/o Mod AS on 2017 echo. Syncope pathway IVF 1L bolus in ED BP now 174/99, will hold off on further IVF for the moment Tele monitor 2d echo ordered  Nausea vomiting and diarrhea Most likely infectious gastroenteritis. Symptoms improved since onset 1 wk ago. Symptoms already improved per patient IVF - 1L bolus in ED zofran Supportive care for now  Valvular heart disease As of 2017 echo: Moderate AS, mild MR, mild TR.  Grade 1 DD. Repeat 2D echo ordered for syncope  Hypokalemia Replace K  Essential hypertension BP 174/99 after just 1L in ED (definitely not seeing the low BPs reported at optho office) Resuming all home BP meds PRN hydrlazine also ordered if needed.      Advance Care Planning:   Code Status: Full Code  Consults: None  Family Communication: no family in room  Severity of Illness: The appropriate patient status for this patient is OBSERVATION. Observation status is judged to be reasonable and necessary in order to provide the required intensity of service to ensure the patient's safety. The patient's presenting symptoms, physical exam findings, and initial radiographic and laboratory data in the context of their medical condition is felt to place them at decreased risk for further clinical deterioration. Furthermore, it is anticipated that the patient will be medically stable for discharge from the hospital within 2 midnights of admission.   Author: Hillary Bow., DO 02/18/2022 8:43 PM  For on call review [email or web address].

## 2022-02-19 ENCOUNTER — Observation Stay (HOSPITAL_BASED_OUTPATIENT_CLINIC_OR_DEPARTMENT_OTHER): Payer: Medicare Other

## 2022-02-19 ENCOUNTER — Other Ambulatory Visit (HOSPITAL_COMMUNITY): Payer: Medicare Other

## 2022-02-19 DIAGNOSIS — I10 Essential (primary) hypertension: Secondary | ICD-10-CM | POA: Diagnosis not present

## 2022-02-19 DIAGNOSIS — E876 Hypokalemia: Secondary | ICD-10-CM | POA: Diagnosis not present

## 2022-02-19 DIAGNOSIS — R55 Syncope and collapse: Secondary | ICD-10-CM | POA: Diagnosis not present

## 2022-02-19 DIAGNOSIS — R112 Nausea with vomiting, unspecified: Secondary | ICD-10-CM | POA: Diagnosis not present

## 2022-02-19 DIAGNOSIS — I350 Nonrheumatic aortic (valve) stenosis: Secondary | ICD-10-CM | POA: Diagnosis not present

## 2022-02-19 LAB — CBC
HCT: 37.9 % (ref 36.0–46.0)
Hemoglobin: 12.5 g/dL (ref 12.0–15.0)
MCH: 30.5 pg (ref 26.0–34.0)
MCHC: 33 g/dL (ref 30.0–36.0)
MCV: 92.4 fL (ref 80.0–100.0)
Platelets: 222 10*3/uL (ref 150–400)
RBC: 4.1 MIL/uL (ref 3.87–5.11)
RDW: 13.7 % (ref 11.5–15.5)
WBC: 5.8 10*3/uL (ref 4.0–10.5)
nRBC: 0 % (ref 0.0–0.2)

## 2022-02-19 LAB — GLUCOSE, CAPILLARY: Glucose-Capillary: 103 mg/dL — ABNORMAL HIGH (ref 70–99)

## 2022-02-19 LAB — ECHOCARDIOGRAM COMPLETE
AR max vel: 1.28 cm2
AV Area VTI: 1.22 cm2
AV Area mean vel: 1.2 cm2
AV Mean grad: 13 mmHg
AV Peak grad: 24.2 mmHg
Ao pk vel: 2.46 m/s
Area-P 1/2: 2.39 cm2
Height: 59 in
MV VTI: 1.68 cm2
S' Lateral: 2.2 cm
Weight: 1627.2 oz

## 2022-02-19 LAB — BASIC METABOLIC PANEL
Anion gap: 10 (ref 5–15)
BUN: <5 mg/dL — ABNORMAL LOW (ref 8–23)
CO2: 25 mmol/L (ref 22–32)
Calcium: 9 mg/dL (ref 8.9–10.3)
Chloride: 105 mmol/L (ref 98–111)
Creatinine, Ser: 0.72 mg/dL (ref 0.44–1.00)
GFR, Estimated: >60 mL/min (ref >60)
Glucose, Bld: 98 mg/dL (ref 70–99)
Potassium: 3.9 mmol/L (ref 3.5–5.1)
Sodium: 140 mmol/L (ref 135–145)

## 2022-02-19 MED ORDER — TRAZODONE HCL 50 MG PO TABS: 50.0000 mg | ORAL_TABLET | Freq: Every evening | ORAL | Status: DC | PRN

## 2022-02-19 MED ORDER — GUAIFENESIN 100 MG/5ML PO LIQD: 5.0000 mL | ORAL | Status: DC | PRN

## 2022-02-19 MED ORDER — SENNOSIDES-DOCUSATE SODIUM 8.6-50 MG PO TABS: 1.0000 | ORAL_TABLET | Freq: Every evening | ORAL | Status: DC | PRN

## 2022-02-19 MED ORDER — IPRATROPIUM-ALBUTEROL 0.5-2.5 (3) MG/3ML IN SOLN: 3.0000 mL | RESPIRATORY_TRACT | Status: DC | PRN

## 2022-02-19 MED ORDER — METOPROLOL TARTRATE 5 MG/5ML IV SOLN: 5.0000 mg | INTRAVENOUS | Status: DC | PRN

## 2022-02-19 MED ORDER — HYDRALAZINE HCL 20 MG/ML IJ SOLN: 10.0000 mg | INTRAMUSCULAR | Status: DC | PRN

## 2022-02-19 NOTE — TOC Transition Note (Signed)
Transition of Care [facility]) - CM/SW Discharge Note   Patient Details  Name: Melanie [name] MRN: [id number] Date of Birth: Jan 16, 1924  Transition of Care [facility] CM/SW Contact:  Leone Haven, RN Phone Number: 02/19/2022, 1:09 PM   Clinical Narrative:    Patient is for dc today, has no needs.          Patient Goals and CMS Choice        Discharge Placement                       Discharge Plan and Services                                     Social Determinants of Health (SDOH) Interventions     Readmission Risk Interventions     No data to display

## 2022-02-19 NOTE — Plan of Care (Signed)
  Problem: Clinical Measurements: Goal: Diagnostic test results will improve Outcome: Progressing Goal: Respiratory complications will improve Outcome: Not Applicable

## 2022-03-13 ENCOUNTER — Telehealth: Payer: Self-pay | Admitting: Cardiovascular Disease

## 2022-03-13 NOTE — Telephone Encounter (Signed)
Pt c/o BP issue: STAT if pt c/o blurred vision, one-sided weakness or slurred speech  1. What are your last 5 BP readings?  158/64   74 HR 156/81   70 HR 183/84   66 HR 163/86   71 HR 152/73   67 HR   < 07/19   2. Are you having any other symptoms (ex. Dizziness, headache, blurred vision, passed out)? No  3. What is your BP issue? Pt states that her BP is "all over the map" and would like to talk to someone in regards to her medication and her BP.

## 2022-03-13 NOTE — Telephone Encounter (Signed)
Spoke with patient who states that her BP has been elevated "for over a year now." However, pt was in office 12/22 and it was 765 systolic. Patient recently seen in ED on 02/18/22 and discharge summary shows Amlodipine 2.5 BID and Losartan 50 QD. Patient has been on Amlodipine since 2020 and per our records, she is still taking, BUT she states that someone took her off because it was causing her ' all these issues. My tongue was swelling, my legs were swelling, all sorts of stuff and I don't want to take it anymore." Recent note from PCP office also shows that they have her on it. She has stopped taking this however, doesn't want to resume, and BP is high. She also states that her home BP kit is unreliable. She feels the readings are "all over the place" and states she's not sure if its working. Advised her that we can't make changes on medications until we first verify if her BP readings are elevated/her baseline. She has been scheduled for a NV on 03/18/22. She states she has a CNA that comes to her home on M/W/F, but uses pt's BP kit to check her pressure. We will need to provide her a new kit while here if her's is deemed inaccurate and battery change doesn't correct. Asked her to bring all her home meds with her to visit, as I truly feel she has confused the Amlodipine with something else.

## 2022-03-14 ENCOUNTER — Telehealth: Payer: Self-pay | Admitting: Cardiovascular Disease

## 2022-03-14 NOTE — Telephone Encounter (Signed)
Pt c/o BP issue: STAT if pt c/o blurred vision, one-sided weakness or slurred speech  1. What are your last 5 BP readings?  7/18 - 183/84 7/19 - 163/86 7/19 - 186/88 (last night) 7/20 - 144/77  2. Are you having any other symptoms (ex. Dizziness, headache, blurred vision, passed out)? No  3. What is your BP issue? Pt is concerned about BP being high. Pt would like a callback from nurse. Please advise

## 2022-03-14 NOTE — Telephone Encounter (Signed)
Called pt in regards to HTN.  Reports BP up and down from day to day.  Was taken off of metoprolol, HCTZ and amlodipine recently.  Is only taking losartan 50 mg PO QD.   When asked who stopped medication expresses can not remember.  But was in the hospital on 02/18/22 for syncope.  According to ED note metoprolol was stopped. "Advised to hold metoprolol if the heart rate remains below 75.  Needs to discuss this with her PCP.  For now I will discontinue it from her discharge medications"  BP elevated last night 186/88 so pt took an extra dose of losartan 50 mg.  BP this am was 144/77-66.  Denies HA does report some dizziness and seeing black spots. But says this has occurred previously and occurred prior to syncopal episode that sent to ED in June.  Also reports swelling to legs, ankles and feet.  Advised pt that MD is not in the office today but I will send message to be addressed.  Pt has a NV scheduled for 03/18/22 advised to bring in home BP cuff.  No further concerns voiced.

## 2022-03-18 ENCOUNTER — Ambulatory Visit (INDEPENDENT_AMBULATORY_CARE_PROVIDER_SITE_OTHER): Payer: Medicare Other

## 2022-03-18 VITALS — BP 187/86 | HR 70 | Ht 59.0 in

## 2022-03-18 DIAGNOSIS — I10 Essential (primary) hypertension: Secondary | ICD-10-CM

## 2022-03-18 MED ORDER — LOSARTAN POTASSIUM 50 MG PO TABS: 50.0000 mg | ORAL_TABLET | Freq: Two times a day (BID) | ORAL | 3 refills | Status: DC

## 2022-03-18 MED ORDER — AMLODIPINE BESYLATE 2.5 MG PO TABS: 2.5000 mg | ORAL_TABLET | Freq: Two times a day (BID) | ORAL | 3 refills | Status: DC

## 2022-03-18 NOTE — Patient Instructions (Signed)
Medication Instructions:  1) RESTART taking amlodipine 2.5 mg twice daily *If you need a refill on your cardiac medications before your next appointment, please call your pharmacy*   Follow-Up: At [facility], you and your health needs are our priority.  As part of our continuing mission to provide you with exceptional heart care, we have created designated Provider Care Teams.  These Care Teams include your primary Cardiologist (physician) and Advanced Practice Providers (APPs -  Physician Assistants and Nurse Practitioners) who all work together to provide you with the care you need, when you need it.  Your next appointment:   August 23rd at 3:00pm for a Nurse Visit  Low-Sodium Eating Plan Sodium, which is an element that makes up salt, helps you maintain a healthy balance of fluids in your body. Too much sodium can increase your blood pressure and cause fluid and waste to be held in your body. Your health care provider or dietitian may recommend following this plan if you have high blood pressure (hypertension), kidney disease, liver disease, or heart failure. Eating less sodium can help lower your blood pressure, reduce swelling, and protect your heart, liver, and kidneys. What are tips for following this plan? Reading food labels The Nutrition Facts label lists the amount of sodium in one serving of the food. If you eat more than one serving, you must multiply the listed amount of sodium by the number of servings. Choose foods with less than 140 mg of sodium per serving. Avoid foods with 300 mg of sodium or more per serving. Shopping  Look for lower-sodium products, often labeled as "low-sodium" or "no salt added." Always check the sodium content, even if foods are labeled as "unsalted" or "no salt added." Buy fresh foods. Avoid canned foods and pre-made or frozen meals. Avoid canned, cured, or processed meats. Buy breads that have less than 80 mg of sodium per  slice. Cooking  Eat more home-cooked food and less restaurant, buffet, and fast food. Avoid adding salt when cooking. Use salt-free seasonings or herbs instead of table salt or sea salt. Check with your health care provider or pharmacist before using salt substitutes. Cook with plant-based oils, such as canola, sunflower, or olive oil. Meal planning When eating at a restaurant, ask that your food be prepared with less salt or no salt, if possible. Avoid dishes labeled as brined, pickled, cured, smoked, or made with soy sauce, miso, or teriyaki sauce. Avoid foods that contain MSG (monosodium glutamate). MSG is sometimes added to [place] food, bouillon, and some canned foods. Make meals that can be grilled, baked, poached, roasted, or steamed. These are generally made with less sodium. General information Most people on this plan should limit their sodium intake to 1,500-2,000 mg (milligrams) of sodium each day. What foods should I eat? Fruits Fresh, frozen, or canned fruit. Fruit juice. Vegetables Fresh or frozen vegetables. "No salt added" canned vegetables. "No salt added" tomato sauce and paste. Low-sodium or reduced-sodium tomato and vegetable juice. Grains Low-sodium cereals, including oats, puffed wheat and rice, and shredded wheat. Low-sodium crackers. Unsalted rice. Unsalted pasta. Low-sodium bread. Whole-grain breads and whole-grain pasta. Meats and other proteins Fresh or frozen (no salt added) meat, poultry, seafood, and fish. Low-sodium canned tuna and salmon. Unsalted nuts. Dried peas, beans, and lentils without added salt. Unsalted canned beans. Eggs. Unsalted nut butters. Dairy Milk. Soy milk. Cheese that is naturally low in sodium, such as ricotta cheese, fresh mozzarella, or Swiss cheese. Low-sodium or reduced-sodium cheese. Cream cheese.  Yogurt. Seasonings and condiments Fresh and dried herbs and spices. Salt-free seasonings. Low-sodium mustard and ketchup. Sodium-free salad  dressing. Sodium-free light mayonnaise. Fresh or refrigerated horseradish. Lemon juice. Vinegar. Other foods Homemade, reduced-sodium, or low-sodium soups. Unsalted popcorn and pretzels. Low-salt or salt-free chips. The items listed above may not be a complete list of foods and beverages you can eat. Contact a dietitian for more information. What foods should I avoid? Vegetables Sauerkraut, pickled vegetables, and relishes. Olives. [place] fries. Onion rings. Regular canned vegetables (not low-sodium or reduced-sodium). Regular canned tomato sauce and paste (not low-sodium or reduced-sodium). Regular tomato and vegetable juice (not low-sodium or reduced-sodium). Frozen vegetables in sauces. Grains Instant hot cereals. Bread stuffing, pancake, and biscuit mixes. Croutons. Seasoned rice or pasta mixes. Noodle soup cups. Boxed or frozen macaroni and cheese. Regular salted crackers. Self-rising flour. Meats and other proteins Meat or fish that is salted, canned, smoked, spiced, or pickled. Precooked or cured meat, such as sausages or meat loaves. Tomasa Blase. Ham. Pepperoni. Hot dogs. Corned beef. Chipped beef. Salt pork. Jerky. Pickled herring. Anchovies and sardines. Regular canned tuna. Salted nuts. Dairy Processed cheese and cheese spreads. Hard cheeses. Cheese curds. Blue cheese. Feta cheese. String cheese. Regular cottage cheese. Buttermilk. Canned milk. Fats and oils Salted butter. Regular margarine. Ghee. Bacon fat. Seasonings and condiments Onion salt, garlic salt, seasoned salt, table salt, and sea salt. Canned and packaged gravies. Worcestershire sauce. Tartar sauce. Barbecue sauce. Teriyaki sauce. Soy sauce, including reduced-sodium. Steak sauce. Fish sauce. Oyster sauce. Cocktail sauce. Horseradish that you find on the shelf. Regular ketchup and mustard. Meat flavorings and tenderizers. Bouillon cubes. Hot sauce. Pre-made or packaged marinades. Pre-made or packaged taco seasonings. Relishes.  Regular salad dressings. Salsa. Other foods Salted popcorn and pretzels. Corn chips and puffs. Potato and tortilla chips. Canned or dried soups. Pizza. Frozen entrees and pot pies. The items listed above may not be a complete list of foods and beverages you should avoid. Contact a dietitian for more information. Summary Eating less sodium can help lower your blood pressure, reduce swelling, and protect your heart, liver, and kidneys. Most people on this plan should limit their sodium intake to 1,500-2,000 mg (milligrams) of sodium each day. Canned, boxed, and frozen foods are high in sodium. Restaurant foods, fast foods, and pizza are also very high in sodium. You also get sodium by adding salt to food. Try to cook at home, eat more fresh fruits and vegetables, and eat less fast food and canned, processed, or prepared foods. This information is not intended to replace advice given to you by your health care provider. Make sure you discuss any questions you have with your health care provider. Document Revised: 09/17/2019 Document Reviewed: 07/14/2019 Elsevier Patient Education  2023 [employer].

## 2022-03-18 NOTE — Progress Notes (Signed)
   Nurse Visit   Date of Encounter: 03/18/2022 ID: Melanie [name], DOB 03-03-1924, MRN [id number]  PCP:  Creola Corn, MD   [facility] Providers Cardiologist:  Kristeen Miss, MD      Visit Details   VS:  BP (!) 187/86   Pulse 70   Ht 4\' 11"  (1.499 m)   BMI 20.54 kg/m  , BMI Body mass index is 20.54 kg/m.  Wt Readings from Last 3 Encounters:  02/19/22 101 lb 11.2 oz (46.1 kg)  07/27/21 118 lb 12.8 oz (53.9 kg)  03/28/21 111 lb (50.3 kg)     Reason for visit: Blood Pressure Check  Patient came in for a blood pressure check due to elevated at home readings.  Right Arm: home cuff - 173/81, office cuff- 179/82 Left Arm: home cuff - 187/86, office cuff - 162/74  Performed today: Vitals, Provider consulted:Dr. Nahser, and Education Changes (medications, testing, etc.) : Restarted amlodipine, continue losartan 50 mg twice daily  Come back in 1 month for another nurse visit for BP check.  Length of Visit: 35 minutes    Medications Adjustments/Labs and Tests Ordered: No orders of the defined types were placed in this encounter.  Meds ordered this encounter  Medications   losartan (COZAAR) 50 MG tablet    Sig: Take 1 tablet (50 mg total) by mouth in the morning and at bedtime.    Dispense:  180 tablet    Refill:  3   amLODipine (NORVASC) 2.5 MG tablet    Sig: Take 1 tablet (2.5 mg total) by mouth 2 (two) times daily.    Dispense:  180 tablet    Refill:  3     Signed, 05/28/21, RN  03/18/2022 3:54 PM

## 2022-04-17 ENCOUNTER — Ambulatory Visit (INDEPENDENT_AMBULATORY_CARE_PROVIDER_SITE_OTHER): Payer: Medicare Other

## 2022-04-17 DIAGNOSIS — I10 Essential (primary) hypertension: Secondary | ICD-10-CM

## 2022-04-17 MED ORDER — LOSARTAN POTASSIUM 100 MG PO TABS: 100.0000 mg | ORAL_TABLET | Freq: Two times a day (BID) | ORAL | 3 refills | Status: DC

## 2022-04-17 NOTE — Progress Notes (Signed)
   Nurse Visit   Date of Encounter: 04/17/2022 ID: Melanie [name], DOB 08-11-1924, MRN [id number]  PCP:  Creola Corn, MD   [facility] Providers Cardiologist:  Kristeen Miss, MD      Visit Details   VS:  BP (!) 170/80 (BP Location: Right Arm, Patient Position: Sitting, Cuff Size: Normal)   Pulse 65   Wt 106 lb 3.2 oz (48.2 kg)   SpO2 98%   BMI 21.45 kg/m  , BMI Body mass index is 21.45 kg/m.  Wt Readings from Last 3 Encounters:  04/17/22 106 lb 3.2 oz (48.2 kg)  02/19/22 101 lb 11.2 oz (46.1 kg)  07/27/21 118 lb 12.8 oz (53.9 kg)     Reason for visit: BP Check  Performed today: Vitals Changes (medications, testing, etc.) : Per Dr. Elease Hashimoto pt to increase her Losartan to 100 mg twice daily  Length of Visit: 30 minutes    Medications Adjustments/Labs and Tests Ordered: No orders of the defined types were placed in this encounter.  Meds ordered this encounter  Medications   losartan (COZAAR) 100 MG tablet    Sig: Take 1 tablet (100 mg total) by mouth 2 (two) times daily.    Dispense:  180 tablet    Refill:  3   NOTE: Pt says she has some problems with "pinched nerves" in her neck that causes her to have some dizziness and presyncope. She was told she is not a candidate for neuro surgery. The pt and her CNA are asking of she can possibly have a Carotid US prior to her 07/01/22 appt with Dr. Elease Hashimoto... last carotid 2017. Will forward to Dr. Elease Hashimoto for review.   Denzil Hughes, RN  04/17/2022 3:32 PM

## 2022-04-17 NOTE — Patient Instructions (Addendum)
Medication Instructions:   Increase Losartan to 100 mg twice a day.     *If you need a refill on your cardiac medications before your next appointment, please call your pharmacy*   Lab Work:  If you have labs (blood work) drawn today and your tests are completely normal, you will receive your results only by: MyChart Message (if you have MyChart) OR A paper copy in the mail If you have any lab test that is abnormal or we need to change your treatment, we will call you to review the results.   Testing/Procedures:    Follow-Up: At [facility], you and your health needs are our priority.  As part of our continuing mission to provide you with exceptional heart care, we have created designated Provider Care Teams.  These Care Teams include your primary Cardiologist (physician) and Advanced Practice Providers (APPs -  Physician Assistants and Nurse Practitioners) who all work together to provide you with the care you need, when you need it.  We recommend signing up for the patient portal called "MyChart".  Sign up information is provided on this After Visit Summary.  MyChart is used to connect with patients for Virtual Visits (Telemedicine).  Patients are able to view lab/test results, encounter notes, upcoming appointments, etc.  Non-urgent messages can be sent to your provider as well.   To learn more about what you can do with MyChart, go to [email or web address].    Your next appointment:  Recall in for December with Dr. Elease Hashimoto.... will make appt today  1}    Other Instructions   Important Information About Sugar

## 2022-04-22 ENCOUNTER — Telehealth: Payer: Self-pay

## 2022-04-22 MED ORDER — VALSARTAN 320 MG PO TABS: 320.0000 mg | ORAL_TABLET | Freq: Every day | ORAL | 3 refills | Status: DC

## 2022-04-22 NOTE — Telephone Encounter (Signed)
Left detailed message on daughter Donna's cell (per DPR). Medication sent to pharmacy on file and Losartan removed.  Nahser, Deloris Ping, MD  You 26 minutes ago (3:17 PM)    Im not sure how she got on Losartan 100 BID   Lets DC Losartan  Start Valsartan 320 mg a day   PN

## 2022-04-22 NOTE — Telephone Encounter (Signed)
**Note De-Identified Melanie [name] Obfuscation** The pts plan states that Losartan 100 mg BID exceeds the maximum recommended daily dose of 100 mg daily.  Will forward this message to Dr Elease Hashimoto for advisement on why pt needs Losartan 100 mg BID rather than once daily.

## 2022-05-03 ENCOUNTER — Telehealth: Payer: Self-pay | Admitting: Cardiovascular Disease

## 2022-05-03 NOTE — Telephone Encounter (Signed)
[name], Melanie Ping, MD to Me   04/22/22  3:17 PM Im not sure how she got on Losartan 100 BID  Lets DC Losartan  Start Valsartan 320 mg a day  PN   Via, Lorelle Formosa, LPN to [name], Melanie Ping, MD  Me  [facility]      04/22/22  2:18 PM Dr Elease Hashimoto and/or PharmD,  The pts plan will not cover Losartan 100 mg BID because it exceeds the maximum recommended daily dose of 100 mg daily.  If the pt needs this dose please explain why so I can do a quantity exception.  Thanks,  Larita Fife   Returned call to patient (daughter Lupita Leash present also) to review medication dosages with her. She took notes and states that she understands to take Valsartan 320mg  once daily and that it replaced her old Losartan. Also understands that she is still to be taking the Amlodipine 2.5mg  twice daily. No further questions.

## 2022-05-03 NOTE — Telephone Encounter (Signed)
Patient called to get clarification on how much of her new medication (valsartan (DIOVAN) 320 MG tablet) she should be taking and if she should still take the amLODipine (NORVASC) 2.5 MG tablet in the morning and at night and how many mg of each medication.

## 2022-05-20 ENCOUNTER — Other Ambulatory Visit: Payer: Self-pay | Admitting: Cardiovascular Disease

## 2022-06-30 ENCOUNTER — Encounter: Payer: Self-pay | Admitting: Cardiovascular Disease

## 2022-06-30 NOTE — Progress Notes (Unsigned)
Cardiology Office Note   Date:  07/01/2022   ID:  Melanie [name], DOB Jan 16, 1924, MRN [id number]  PCP:  Creola Corn, MD  Cardiologist:   Kristeen Miss, MD   Chief Complaint  Patient presents with   Hypertension        Mitral Regurgitation    Problem List 1. Mitral regurgitation  2. Back pains  3. Mild left carotid artery artery disease    Previous notes.  Melanie [name] is a 88 y.o. female who presents for evaluation of a new heart murmur .  She reports that she is here for a regular check up - no CP or dyspnea.  She used to see Dr. [name]  ( moved to [place], [place])  Brought a record of her BP and HR readings - all look very good.   She has had an echo in Dr. [name] office. Normal LV function . Possible PFO. , trace AI  Mild MR  BP and HR is very well controlled  Nov. 16, 2017:     Melanie [name] is seen back for a follow up visit after a year.  Her husband passed away this past 2023/01/10.   Awoke with some heart flutters ,   BP was a little elevated BP is great .  November 26, 2017: Melanie [name] seen back today for follow-up of her mild mitral regurgitation and hypertension. Was seen by [name] in July for palpitations .   No arrhythmias found  Has been to the ER several times.  - once for a UTI Another for reaction to a med - ? Flu shot   Not so much exercise recently but is active.   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  Has mild AS, mil dMR, mild TR   Nov. 4 2020  Has some swelling in her ankles at night. Better by morning.  She has a history of hypertension and I see that she is on amlodipine 2.5 mg twice a day.  She is on potassium chloride but is not on a diuretic.  Her potassium was low at her last BMP   Dr. Timothy Lasso has prescribed her the potassium   Dec 28, 2019:  Doing well  Has some occasional upper gastric pressure Occurs when she is sitting after eating .  Is not related to exertion   March 28, 2021: Seen with daughter, Roza Creamer  is seen today for further evaluation of some leg edema. She has not been eating much salt.  She does not elevate her legs.  We discussed leg elevation.  She is on amlodipine which could be causing her leg edema to be worse than it otherwise would be. Home BP readings are elevated on occasion BP here today is good  Avoids salt and salty foods.    Nov. 6, 2023 Melanie [name] is seen today with daughter, [name]    Hx of HTN, mild leg swelling   Having some back issues   Brought her BP log with her  BP ranges from the normal to mildly elevated.  No syncope  Is taking Amlodipine 2.5 BID Valsartan 320 mg a day   Has back issues , legs are numb    Past Medical History:  Diagnosis Date   Bruises easily    on Aspirin;on hold for surgery   Carotid stenosis    CHF (congestive heart failure) (HCC)    diastolic dysfunction   Chronic  back pain    stenosis   Chronic sinusitis    Diverticulosis    Esophageal stricture    GERD (gastroesophageal reflux disease)    takes Protonix daily   Heart murmur    as a child   Hemorrhoids    History of colon polyps    Hypertension    takes Amlodipine,HCTZ,and Metoprolol   Insomnia    Mitral insufficiency and aortic stenosis    Palpitations    Peripheral edema    Schatzki's ring    Scoliosis    Stroke [facility])     Past Surgical History:  Procedure Laterality Date   BREAST ENHANCEMENT SURGERY     CHOLECYSTECTOMY     COLONOSCOPY     DILATION AND CURETTAGE OF UTERUS     epidural injections     ESOPHAGOGASTRODUODENOSCOPY     EYE SURGERY     left cataract removed   LUMBAR LAMINECTOMY/DECOMPRESSION MICRODISCECTOMY Left 01/26/2013   Procedure: LUMBAR LAMINECTOMY/DECOMPRESSION MICRODISCECTOMY 1 LEVEL;  Surgeon: Kristeen Miss, MD;  Location: [facility];  Service: Neurosurgery;  Laterality: Left;  Left Lumbar four-five Laminectomy/Foraminotomy   steroid injections     x 3   TOTAL HIP ARTHROPLASTY Right       Current Outpatient Medications  Medication Sig Dispense Refill   amLODipine (NORVASC) 2.5 MG tablet Take 1 tablet (2.5 mg total) by mouth 2 (two) times daily. 180 tablet 3   gabapentin (NEURONTIN) 100 MG capsule Take 100 mg by mouth at bedtime.     hydroxypropyl methylcellulose / hypromellose (ISOPTO TEARS / GONIOVISC) 2.5 % ophthalmic solution Place 1 drop into both eyes 3 (three) times daily as needed for dry eyes.     Multiple Vitamin (MULTIVITAMIN WITH MINERALS) TABS tablet Take 1 tablet by mouth daily.     ondansetron (ZOFRAN-ODT) 4 MG disintegrating tablet Take 4 mg by mouth daily as needed for nausea or vomiting.     pantoprazole (PROTONIX) 40 MG tablet Take 40 mg by mouth every evening.     traMADol (ULTRAM) 50 MG tablet Take 50 mg by mouth.     valsartan (DIOVAN) 320 MG tablet Take 1 tablet (320 mg total) by mouth daily. 90 tablet 3   No current facility-administered medications for this visit.    Allergies:   Ciprofloxacin    Social History:  The patient  reports that she has never smoked. She has never used smokeless tobacco. She reports that she does not drink alcohol and does not use drugs.   Family History:  The patient's family history includes CAD in her brother; Lung cancer in her brother; Renal cancer in her sister; Rheum arthritis in her mother.    ROS:  Please see the history of present illness.      Physical Exam: Blood pressure (!) 140/80, pulse 68, height 4\' 11"  (1.499 m), weight 106 lb 6.4 oz (48.3 kg), SpO2 99 %.  HYPERTENSION CONTROL Vitals:   07/01/22 1433 07/01/22 1456  BP: (!) 142/80 (!) 140/80    The patient's blood pressure is elevated above target today.  In order to address the patient's elevated BP: Blood pressure will be monitored at home to determine if medication changes need to be made.; The blood pressure is usually elevated in clinic.  Blood pressures monitored at home have been optimal.       GEN:  Well nourished, well  developed in no acute distress HEENT: Normal NECK: No JVD; No carotid bruits LYMPHATICS: No lymphadenopathy CARDIAC: RRR  soft systolic  murmur  RESPIRATORY:  Clear to auscultation without rales, wheezing or rhonchi  ABDOMEN: Soft, non-tender, non-distended MUSCULOSKELETAL:  No edema; No deformity  SKIN: Warm and dry NEUROLOGIC:  Alert and oriented x 3   EKG:       Recent Labs: 02/19/2022: BUN <5; Creatinine, Ser 0.72; Hemoglobin 12.5; Platelets 222; Potassium 3.9; Sodium 140    Lipid Panel    Component Value Date/Time   CHOL 181 07/18/2016 0253   TRIG 93 07/18/2016 0253   HDL 56 07/18/2016 0253   CHOLHDL 3.2 07/18/2016 0253   VLDL 19 07/18/2016 0253   LDLCALC 106 (H) 07/18/2016 0253      Wt Readings from Last 3 Encounters:  07/01/22 106 lb 6.4 oz (48.3 kg)  04/17/22 106 lb 3.2 oz (48.2 kg)  02/19/22 101 lb 11.2 oz (46.1 kg)      Other studies Reviewed: Additional studies/ records that were reviewed today include: . Review of the above records demonstrates:    ASSESSMENT AND PLAN: . 1. Leg edema:  better, no significant edema        2. Back pains  :   3. Mild left carotid artery artery disease :     4.  HTN:     BP is in the normal range - slightly elevated.  I think it would be more risky to try to lower her BP further .  She has had orthostatic hypotension in the past .  Contiue to monitor     Current medicines are reviewed at length with the patient today.  The patient does not have concerns regarding medicines.  The following changes have been made:  no change  Labs/ tests ordered today include:   No orders of the defined types were placed in this encounter.        Kristeen Miss, MD  07/01/2022 3:00 PM    [facility] [address] [place], [place], [place]  [address] Phone: [phone]; Fax: [phone]

## 2022-07-01 ENCOUNTER — Encounter: Payer: Self-pay | Admitting: Cardiovascular Disease

## 2022-07-01 ENCOUNTER — Ambulatory Visit: Payer: Medicare Other | Attending: Cardiovascular Disease | Admitting: Cardiovascular Disease

## 2022-07-01 VITALS — BP 140/80 | HR 68 | Ht 59.0 in | Wt 106.4 lb

## 2022-07-01 DIAGNOSIS — I10 Essential (primary) hypertension: Secondary | ICD-10-CM

## 2022-07-01 NOTE — Patient Instructions (Signed)
Medication Instructions:  Your physician recommends that you continue on your current medications as directed. Please refer to the Current Medication list given to you today.  *If you need a refill on your cardiac medications before your next appointment, please call your pharmacy*   Lab Work: NONE If you have labs (blood work) drawn today and your tests are completely normal, you will receive your results only by: MyChart Message (if you have MyChart) OR A paper copy in the mail If you have any lab test that is abnormal or we need to change your treatment, we will call you to review the results.   Testing/Procedures: NONE   Follow-Up: At [facility], you and your health needs are our priority.  As part of our continuing mission to provide you with exceptional heart care, we have created designated Provider Care Teams.  These Care Teams include your primary Cardiologist (physician) and Advanced Practice Providers (APPs -  Physician Assistants and Nurse Practitioners) who all work together to provide you with the care you need, when you need it.  We recommend signing up for the patient portal called "MyChart".  Sign up information is provided on this After Visit Summary.  MyChart is used to connect with patients for Virtual Visits (Telemedicine).  Patients are able to view lab/test results, encounter notes, upcoming appointments, etc.  Non-urgent messages can be sent to your provider as well.   To learn more about what you can do with MyChart, go to [email or web address].    Your next appointment:   6 month(s)  The format for your next appointment:   In Person  Provider:   Philip Nahser, MD       Important Information About Sugar       

## 2022-08-02 ENCOUNTER — Other Ambulatory Visit: Payer: Self-pay

## 2022-08-02 MED ORDER — VALSARTAN 320 MG PO TABS: 320.0000 mg | ORAL_TABLET | Freq: Every day | ORAL | 3 refills | Status: DC

## 2022-11-11 ENCOUNTER — Telehealth: Payer: Self-pay | Admitting: Cardiovascular Disease

## 2022-11-11 ENCOUNTER — Other Ambulatory Visit: Payer: Self-pay | Admitting: *Deleted

## 2022-11-11 MED ORDER — VALSARTAN 320 MG PO TABS: 320.0000 mg | ORAL_TABLET | Freq: Every day | ORAL | 2 refills | Status: DC

## 2022-11-11 MED ORDER — VALSARTAN 320 MG PO TABS: 320.0000 mg | ORAL_TABLET | Freq: Every day | ORAL | 0 refills | Status: DC

## 2022-11-11 MED ORDER — VALSARTAN 320 MG PO TABS: 320.0000 mg | ORAL_TABLET | Freq: Every day | ORAL | 3 refills | Status: DC

## 2022-11-11 NOTE — Telephone Encounter (Signed)
*  STAT* If patient is at the pharmacy, call can be transferred to refill team.   1. Which medications need to be refilled? (please list name of each medication and dose if known) valsartan (DIOVAN) 320 MG tablet   2. Which pharmacy/location (including street and city if local pharmacy) is medication to be sent to? [facility] ([place], [place]   3. Do they need a 30 day or 90 day supply? [place]

## 2022-11-11 NOTE — Telephone Encounter (Signed)
*  STAT* If patient is at the pharmacy, call can be transferred to refill team.   1. Which medications need to be refilled? (please list name of each medication and dose if known)  valsartan (DIOVAN) 320 MG tablet   2. Which pharmacy/location (including street and city if local pharmacy) is medication to be sent to? [id number] - [place], [address] AT [place]   3. Do they need a 30 day or 90 day supply? 7   Until her order comes in the mail

## 2022-11-11 NOTE — Telephone Encounter (Signed)
Pt's medication was sent to pt's pharmacy as requested. Confirmation received.  °

## 2022-11-11 NOTE — Addendum Note (Signed)
Addended by: Carter Kitten D on: 11/11/2022 04:27 PM   Modules accepted: Orders

## 2022-12-23 ENCOUNTER — Telehealth: Payer: Self-pay | Admitting: Cardiovascular Disease
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
## 2022-12-23 NOTE — Telephone Encounter (Signed)
Pt called she states she needs to speak to nurse about her "health". Please advise.

## 2022-12-27 ENCOUNTER — Telehealth: Payer: Self-pay | Admitting: Cardiovascular Disease

## 2022-12-27 NOTE — Telephone Encounter (Signed)
Pt c/o medication issue:  1. Name of Medication:  valsartan (DIOVAN) 320 MG tablet amLODipine (NORVASC) 2.5 MG tablet  2. How are you currently taking this medication (dosage and times per day)?   3. Are you having a reaction (difficulty breathing--STAT)?   4. What is your medication issue?   Patient would like to clarify medication dose/instructions. She is hopeful for a call back as soon as possible--before the weekend if at all possible.

## 2022-12-27 NOTE — Telephone Encounter (Signed)
Spoke with patient to review valsartan dosage and amlodipine dosage. Valsartan 320 mg daily, amlodipine 2.5 mg BID. Patient has been checking her blood pressure daily and will bring readings with her to her next appointment on 12/31/22.  No other concerns at this time.

## 2022-12-30 ENCOUNTER — Ambulatory Visit: Payer: Medicare Other | Admitting: Physician Assistant

## 2022-12-30 NOTE — Progress Notes (Unsigned)
Office Visit    Patient Name: Melanie [name] Date of Encounter: 12/30/2022  Primary Care Provider:  Creola Corn, MD Primary Cardiologist:  Kristeen Miss, MD Primary Electrophysiologist: None   Past Medical History    Past Medical History:  Diagnosis Date   Bruises easily    on Aspirin;on hold for surgery   Carotid stenosis    CHF (congestive heart failure) (HCC)    diastolic dysfunction   Chronic back pain    stenosis   Chronic sinusitis    Diverticulosis    Esophageal stricture    GERD (gastroesophageal reflux disease)    takes Protonix daily   Heart murmur    as a child   Hemorrhoids    History of colon polyps    Hypertension    takes Amlodipine,HCTZ,and Metoprolol   Insomnia    Mitral insufficiency and aortic stenosis    Palpitations    Peripheral edema    Schatzki's ring    Scoliosis    Stroke [facility])    Past Surgical History:  Procedure Laterality Date   BREAST ENHANCEMENT SURGERY     CHOLECYSTECTOMY     COLONOSCOPY     DILATION AND CURETTAGE OF UTERUS     epidural injections     ESOPHAGOGASTRODUODENOSCOPY     EYE SURGERY     left cataract removed   LUMBAR LAMINECTOMY/DECOMPRESSION MICRODISCECTOMY Left 01/26/2013   Procedure: LUMBAR LAMINECTOMY/DECOMPRESSION MICRODISCECTOMY 1 LEVEL;  Surgeon: Barnett Abu, MD;  Location: [facility];  Service: Neurosurgery;  Laterality: Left;  Left Lumbar four-five Laminectomy/Foraminotomy   steroid injections     x 3   TOTAL HIP ARTHROPLASTY Right     Allergies  Allergies  Allergen Reactions   Ciprofloxacin Other (See Comments)    Altered Mental Status      History of Present Illness    Melanie [name]  is a 89 year old female with a PMH of HFpEF, palpitations, CVA s/p 06/2016, carotid stenosis HTN, HLD, moderate aortic stenosis, syncope and orthostatic hypotension who presents today for evaluation of hypertension.  Ms. Vaughan was initially seen by Dr. Elease Hashimoto on 02/2015 for evaluation of heart murmur.  She  was admitted 06/2016 for CVA and had 2D echo completed on 06/2016 that showed EF of 60 to 65% with no RWMA, grade 1 DD with moderate aortic stenosis.  She did not have TEE completed due to history of esophageal stricture and Schatzki's ring.  She wore a 30-day event monitor and declined ILR placement that revealed no arrhythmias or atrial fibrillation.  She was last seen by Dr. Elease Hashimoto on 06/2022 and had some complaints of back pain and numbness in her legs..  During her visit her blood pressures ranging from normal to mildly elevated.  Her blood pressure during visit was initially 142/80 and was 140/80 on recheck.  She was advised to monitor her blood pressures at home and no medication changes were made at that time due to her history of orthostatic hypotension.  Patient contacted our office on 11/2022 with complaint of elevated blood pressures.  She reported blood pressures in the 140s to 170s systolically.  She is currently on valsartan 320 mg daily and amlodipine 2.5 mg twice daily.  Ms. Plate presents today for complaint of elevated blood pressure.  Since last being seen in the office patient reports that her blood pressures have been fluctuating with readings in the 140s to 170s at home.  Today during her visit her blood pressure was well-controlled  at 118/60 in the left arm and 110/70 in the right arm.  She did bring her home cuff in to compare to our readings that read 139/72.  She also had complaints of dizziness that she described as the room spinning when she tries to stand in the mornings.  She recently purchased new hearing aids and stated that her left ear was impacted with earwax.  She was euvolemic on exam with trace lower extremity edema present.  She has been compliant with her medications and denies any adverse reactions.Patient denies chest pain, palpitations, dyspnea, PND, orthopnea, nausea, vomiting, dizziness, syncope, edema, weight gain, or early satiety.  Home Medications    Current  Outpatient Medications  Medication Sig Dispense Refill   amLODipine (NORVASC) 2.5 MG tablet Take 1 tablet (2.5 mg total) by mouth 2 (two) times daily. 180 tablet 3   gabapentin (NEURONTIN) 100 MG capsule Take 100 mg by mouth at bedtime.     hydroxypropyl methylcellulose / hypromellose (ISOPTO TEARS / GONIOVISC) 2.5 % ophthalmic solution Place 1 drop into both eyes 3 (three) times daily as needed for dry eyes.     Multiple Vitamin (MULTIVITAMIN WITH MINERALS) TABS tablet Take 1 tablet by mouth daily.     ondansetron (ZOFRAN-ODT) 4 MG disintegrating tablet Take 4 mg by mouth daily as needed for nausea or vomiting.     pantoprazole (PROTONIX) 40 MG tablet Take 40 mg by mouth every evening.     traMADol (ULTRAM) 50 MG tablet Take 50 mg by mouth.     valsartan (DIOVAN) 320 MG tablet Take 1 tablet (320 mg total) by mouth daily. 15 tablet 0   No current facility-administered medications for this visit.     Review of Systems  Please see the history of present illness.    (+) Dizziness (+) Lower extremity swelling  All other systems reviewed and are otherwise negative except as noted above.  Physical Exam    Wt Readings from Last 3 Encounters:  07/01/22 106 lb 6.4 oz (48.3 kg)  04/17/22 106 lb 3.2 oz (48.2 kg)  02/19/22 101 lb 11.2 oz (46.1 kg)   ZO:XWRUE were no vitals filed for this visit.,There is no height or weight on file to calculate BMI.  Constitutional:      Appearance: Healthy appearance. Not in distress.  Neck:     Vascular: JVD normal.  Pulmonary:     Effort: Pulmonary effort is normal.     Breath sounds: No wheezing. No rales. Diminished in the bases Cardiovascular:     Normal rate. Regular rhythm. Normal S1. Normal S2.      Murmurs: 2/6 systolic murmur Edema:    Trace lower extremity edema Abdominal:     Palpations: Abdomen is soft non tender. There is no hepatomegaly.  Skin:    General: Skin is warm and dry.  Neurological:     General: No focal deficit present.      Mental Status: Alert and oriented to person, place and time.     Cranial Nerves: Cranial nerves are intact.  EKG/LABS/ Recent Cardiac Studies    ECG personally reviewed by me today -none completed today  Cardiac Studies & Procedures       ECHOCARDIOGRAM  ECHOCARDIOGRAM COMPLETE 02/19/2022  Narrative ECHOCARDIOGRAM REPORT    Patient Name:   ASHLAND HUTH Date of Exam: 02/19/2022 Medical Rec #:  [id number]       Height:       59.0 in Accession #:    [id number]  Weight:       101.7 lb Date of Birth:  [birth year]       BSA:          1.384 m Patient Age:    89 years        BP:           146/51 mmHg Patient Gender: F               HR:           60 bpm. Exam Location:  Inpatient  Procedure: 2D Echo  Indications:    syncope. aortic stenosis.  History:        Patient has prior history of Echocardiogram examinations, most recent 07/18/2016. History of stroke, mitral regurgitation and Aortic Valve Disease; Risk Factors:Hypertension.  Sonographer:    Delcie Roch RDCS Referring Phys: [phone] JARED M GARDNER  IMPRESSIONS   1. Left ventricular ejection fraction, by estimation, is 60 to 65%. The left ventricle has normal function. The left ventricle has no regional wall motion abnormalities. Left ventricular diastolic parameters are consistent with Grade II diastolic dysfunction (pseudonormalization). Elevated left atrial pressure. 2. Right ventricular systolic function is normal. The right ventricular size is normal. There is mildly elevated pulmonary artery systolic pressure. The estimated right ventricular systolic pressure is 38.3 mmHg. 3. Left atrial size was mildly dilated. 4. The mitral valve is degenerative. Mild to moderate mitral valve regurgitation. No evidence of mitral stenosis. Moderate to severe mitral annular calcification. 5. Tricuspid valve regurgitation is mild to moderate. 6. The aortic valve is tricuspid. There is severe calcifcation of the aortic valve.  There is severe thickening of the aortic valve. Aortic valve regurgitation is trivial. Moderate aortic valve stenosis. Aortic valve mean gradient measures 13.0 mmHg. Aortic valve Vmax measures 2.46 m/s. Aortic valve acceleration time measures 90 msec. 7. The inferior vena cava is normal in size with greater than 50% respiratory variability, suggesting right atrial pressure of 3 mmHg.  FINDINGS Left Ventricle: Left ventricular ejection fraction, by estimation, is 60 to 65%. The left ventricle has normal function. The left ventricle has no regional wall motion abnormalities. The left ventricular internal cavity size was normal in size. There is no left ventricular hypertrophy. Left ventricular diastolic parameters are consistent with Grade II diastolic dysfunction (pseudonormalization). Elevated left atrial pressure.  Right Ventricle: The right ventricular size is normal. No increase in right ventricular wall thickness. Right ventricular systolic function is normal. There is mildly elevated pulmonary artery systolic pressure. The tricuspid regurgitant velocity is 2.97 m/s, and with an assumed right atrial pressure of 3 mmHg, the estimated right ventricular systolic pressure is 38.3 mmHg.  Left Atrium: Left atrial size was mildly dilated.  Right Atrium: Right atrial size was normal in size.  Pericardium: There is no evidence of pericardial effusion.  Mitral Valve: The mitral valve is degenerative in appearance. Moderate to severe mitral annular calcification. Mild to moderate mitral valve regurgitation, with centrally-directed jet. No evidence of mitral valve stenosis. The mean mitral valve gradient is 3.0 mmHg.  Tricuspid Valve: The tricuspid valve is normal in structure. Tricuspid valve regurgitation is mild to moderate.  Aortic Valve: The aortic valve is tricuspid. There is severe calcifcation of the aortic valve. There is severe thickening of the aortic valve. Aortic valve regurgitation is  trivial. Moderate aortic stenosis is present. Aortic valve mean gradient measures 13.0 mmHg. Aortic valve peak gradient measures 24.2 mmHg. Aortic valve area, by VTI measures 1.22 cm.  Pulmonic Valve: The pulmonic  valve was not well visualized. Pulmonic valve regurgitation is trivial.  Aorta: The aortic root and ascending aorta are structurally normal, with no evidence of dilitation.  Venous: The inferior vena cava is normal in size with greater than 50% respiratory variability, suggesting right atrial pressure of 3 mmHg.  IAS/Shunts: No atrial level shunt detected by color flow Doppler.   LEFT VENTRICLE PLAX 2D LVIDd:         4.00 cm   Diastology LVIDs:         2.20 cm   LV e' medial:    5.22 cm/s LV PW:         0.90 cm   LV E/e' medial:  29.5 LV IVS:        0.60 cm   LV e' lateral:   5.44 cm/s LVOT diam:     1.99 cm   LV E/e' lateral: 28.3 LV SV:         76 LV SV Index:   55 LVOT Area:     3.11 cm   RIGHT VENTRICLE            IVC RV S prime:     9.14 cm/s  IVC diam: 1.80 cm TAPSE (M-mode): 2.7 cm  LEFT ATRIUM             Index        RIGHT ATRIUM           Index LA diam:        3.20 cm 2.31 cm/m   RA Area:     10.00 cm LA Vol (A2C):   35.9 ml 25.91 ml/m  RA Volume:   20.20 ml  14.60 ml/m LA Vol (A4C):   38.9 ml 28.11 ml/m LA Biplane Vol: 39.1 ml 28.26 ml/m AORTIC VALVE AV Area (Vmax):    1.28 cm AV Area (Vmean):   1.20 cm AV Area (VTI):     1.22 cm AV Vmax:           246.00 cm/s AV Vmean:          168.000 cm/s AV VTI:            0.622 m AV Peak Grad:      24.2 mmHg AV Mean Grad:      13.0 mmHg LVOT Vmax:         101.50 cm/s LVOT Vmean:        64.950 cm/s LVOT VTI:          0.244 m LVOT/AV VTI ratio: 0.39  AORTA Ao Root diam: 2.90 cm Ao Asc diam:  3.40 cm  MITRAL VALVE                TRICUSPID VALVE MV Area (PHT): 2.39 cm     TR Peak grad:   35.3 mmHg MV Area VTI:   1.68 cm     TR Vmax:        297.00 cm/s MV Mean grad:  3.0 mmHg MV VTI:        0.45  m       SHUNTS MV Decel Time: 317 msec     Systemic VTI:  0.24 m MV E velocity: 154.00 cm/s  Systemic Diam: 1.99 cm MV A velocity: 156.00 cm/s MV E/A ratio:  0.99  Mihai Croitoru MD Electronically signed by Thurmon Fair MD Signature Date/Time: 02/19/2022/1:56:31 PM    Final    MONITORS  CARDIAC EVENT MONITOR 09/08/2016  Narrative Sinus rhythm No arrhythmias No atrial  fibrillation               Lab Results  Component Value Date   WBC 5.8 02/19/2022   HGB 12.5 02/19/2022   HCT 37.9 02/19/2022   MCV 92.4 02/19/2022   PLT 222 02/19/2022   Lab Results  Component Value Date   CREATININE 0.72 02/19/2022   BUN <5 (L) 02/19/2022   NA 140 02/19/2022   K 3.9 02/19/2022   CL 105 02/19/2022   CO2 25 02/19/2022   Lab Results  Component Value Date   ALT 9 06/08/2020   AST 20 06/08/2020   ALKPHOS 84 06/08/2020   BILITOT 0.3 06/08/2020   Lab Results  Component Value Date   CHOL 181 07/18/2016   HDL 56 07/18/2016   LDLCALC 106 (H) 07/18/2016   TRIG 93 07/18/2016   CHOLHDL 3.2 07/18/2016    Lab Results  Component Value Date   HGBA1C 5.8 (H) 07/18/2016     Assessment & Plan    1.  Essential hypertension: -Patient's blood pressure today was well-controlled today at 118/60 -She reported complaints of lower extremity swelling and dizziness with standing.  We will decrease her amlodipine to 2.5 mg daily. -Continue Diovan 320 mg daily -She will continue to monitor her blood pressures at home and will contact our office if blood pressures are elevated.  2.Moderate AS/mild MR: -Patient's last 2D echo was completed on 01/2022 with EF of 60 to 65% and grade 2 DD with mild to moderate MVR and aortic valve gradient of 13 mmHg -Patient was euvolemic on exam today with soft systolic murmur present on auscultation.  3.  History of CVA: -s/p acute left lateral ventricular infarct CVA 06/2016 -Patient reports no residual effects from previous CVA. -Patient reported  dizziness with sensation of the room spinning upon standing.   -Patient recently purchased new hearing aids and also reported cerumen impaction at previous PCP office visit.  4.  Hyperlipidemia: -Patient's last LDL cholesterol was 96  5.  Lower extremity swelling: -Patient noted to have trace lower extremity swelling in bilateral extremities. -We will decrease amlodipine to 2.5 mg daily -Patient will wear compression stockings and elevate extremities when dependent.   Disposition: Follow-up with Kristeen Miss, MD as scheduled    Medication Adjustments/Labs and Tests Ordered: Current medicines are reviewed at length with the patient today.  Concerns regarding medicines are outlined above.   Signed, Napoleon Form, Leodis Rains, NP 12/30/2022, 10:19 AM [facility]

## 2022-12-31 ENCOUNTER — Ambulatory Visit: Payer: Medicare Other | Attending: Nurse Practitioner | Admitting: Nurse Practitioner

## 2022-12-31 ENCOUNTER — Ambulatory Visit: Payer: Medicare Other | Admitting: Nurse Practitioner

## 2022-12-31 ENCOUNTER — Encounter: Payer: Self-pay | Admitting: Nurse Practitioner

## 2022-12-31 VITALS — BP 110/70 | HR 60 | Ht 59.0 in | Wt 106.2 lb

## 2022-12-31 DIAGNOSIS — E785 Hyperlipidemia, unspecified: Secondary | ICD-10-CM | POA: Diagnosis not present

## 2022-12-31 DIAGNOSIS — I340 Nonrheumatic mitral (valve) insufficiency: Secondary | ICD-10-CM

## 2022-12-31 DIAGNOSIS — I10 Essential (primary) hypertension: Secondary | ICD-10-CM | POA: Diagnosis not present

## 2022-12-31 DIAGNOSIS — I63412 Cerebral infarction due to embolism of left middle cerebral artery: Secondary | ICD-10-CM | POA: Diagnosis not present

## 2022-12-31 DIAGNOSIS — I350 Nonrheumatic aortic (valve) stenosis: Secondary | ICD-10-CM

## 2022-12-31 MED ORDER — AMLODIPINE BESYLATE 2.5 MG PO TABS: 2.5000 mg | ORAL_TABLET | Freq: Every day | ORAL | 3 refills | Status: DC

## 2022-12-31 NOTE — Patient Instructions (Addendum)
Medication Instructions:  CHANGE Norvasc to 2.5mg  Take 1 tablet once a day  *If you need a refill on your cardiac medications before your next appointment, please call your pharmacy*   Lab Work: None ordered   Testing/Procedures: None ordered   Follow-Up: At [facility], you and your health needs are our priority.  As part of our continuing mission to provide you with exceptional heart care, we have created designated Provider Care Teams.  These Care Teams include your primary Cardiologist (physician) and Advanced Practice Providers (APPs -  Physician Assistants and Nurse Practitioners) who all work together to provide you with the care you need, when you need it.  We recommend signing up for the patient portal called "MyChart".  Sign up information is provided on this After Visit Summary.  MyChart is used to connect with patients for Virtual Visits (Telemedicine).  Patients are able to view lab/test results, encounter notes, upcoming appointments, etc.  Non-urgent messages can be sent to your provider as well.   To learn more about what you can do with MyChart, go to [email or web address].    Your next appointment:   6 month(s)  Provider:   Kristeen Miss, MD     Other Instructions Get some compression hose and wear during the day and remove at night Elevate your feet and legs during the day or when your sitting down.

## 2023-01-06 NOTE — Telephone Encounter (Signed)
Follow Up:      Patient is calling back with her blood pressure readings.    Daytime Readings:  12-25-22-     134/83 pulse 78 12-26-22-      147/83 pulse 71 12-27-22-       156/76 pulse 67 12-28-22-        169/80 pulse 80 12-29-22-        167/79 pulse 71 12-30-22-         162/75 pulse 65 12-31-22-         179/83 pulse 62 01-01-23-          148/78 pulse 65 01-02-23            157/83 pulse 64 01-03-23-         163/85 pulse 63 01-04-23-          173/90 pulse 65 01-05-23-          145/85 pulse 60 01-06-23            156/81 pulse 73   Evening Readings:  12-25-22-  149/76 pulse 76 12-26-22-   no reading 12-27-22-   156/74 pulse 65 12-28-22-    169/80 pulse 80 12-29-22-     no reading 12-30-22-     162/75 pulse 65 12-31-22-     179/83 pulse 62 162/84 pulse 63 01-01-23-      168/87 pulse 66 01-02-23-      167/85 pulse 63 01-03-23-    144/77 pulse 65 01-04-23-     137/79 pulse 59 01-05-23-     150/79 pulse 62   She wants to know what blood pressure medicine does she need to take?

## 2023-01-06 NOTE — Telephone Encounter (Signed)
Spoke with the patient who is calling in with blood pressure readings. She was seen by Robin Searing, NP recently and amlodipine was decreased to 2.5 mg daily due to swelling. The patient states that she continues to have swelling. She reports that she has also been having trouble with vertigo-like symptoms when she gets out of bed. She states that it takes her a while to get her balance when getting up. She does report pinched nerves in her neck and has previously seen Dr. [name]. Advised patient that I will forward message to Alden Server for any advisement.

## 2023-01-07 ENCOUNTER — Ambulatory Visit: Payer: Medicare Other | Admitting: Physician Assistant

## 2023-01-07 MED ORDER — AMLODIPINE BESYLATE 5 MG PO TABS: 5.0000 mg | ORAL_TABLET | Freq: Every day | ORAL | 3 refills | Status: DC

## 2023-01-07 NOTE — Telephone Encounter (Signed)
Spoke with the patient and gave recommendations from Ernest. Patient verbalized understanding.  

## 2023-01-14 ENCOUNTER — Telehealth: Payer: Self-pay | Admitting: Cardiovascular Disease
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
## 2023-01-14 NOTE — Telephone Encounter (Signed)
BP Readings:  156/80 147/79 165/97 168/91  Patient wanted to give that information to Dr. Elease Hashimoto as well as to let him know that she has also been experiencing bad vertigo. Would like for Dr. Elease Hashimoto or nurse to call back

## 2023-01-15 NOTE — Telephone Encounter (Signed)
Spoke with patient and shared Dr. Harvie Bridge response:  BP readings are mildly elevated Make sure she is avoiding salt and salty foods  I will have to defer to her primary MD for further comment about her vertigo since that is not a cardiac issue  We can discuss her BP readings at her next visit  PN    Patient verbalized understanding and expressed appreciation for call. She states she will contact her PCP this morning to discuss vertigo/pinched nerve in cervical spine.

## 2023-01-21 ENCOUNTER — Other Ambulatory Visit: Payer: Self-pay

## 2023-01-21 MED ORDER — VALSARTAN 320 MG PO TABS: 320.0000 mg | ORAL_TABLET | Freq: Every day | ORAL | 3 refills | Status: DC

## 2023-01-21 MED ORDER — VALSARTAN 320 MG PO TABS: 320.0000 mg | ORAL_TABLET | Freq: Every day | ORAL | 0 refills | Status: DC

## 2023-01-23 ENCOUNTER — Other Ambulatory Visit: Payer: Self-pay

## 2023-01-23 MED ORDER — VALSARTAN 320 MG PO TABS: 320.0000 mg | ORAL_TABLET | Freq: Every day | ORAL | 3 refills | Status: DC

## 2023-02-11 ENCOUNTER — Telehealth: Payer: Self-pay | Admitting: Cardiovascular Disease

## 2023-02-11 NOTE — Telephone Encounter (Signed)
Pt c/o BP issue: STAT if pt c/o blurred vision, one-sided weakness or slurred speech  1. What are your last 5 BP readings?   160/82 163/79 167/89   2. Are you having any other symptoms (ex. Dizziness, headache, blurred vision, passed out)? Vertigo for about a week or two   3. What is your BP issue? Pt states her bp ha been around that range for about 1-2 weeks. She wants to know if Dr. Elease Hashimoto thinks her meds need to be adjusted. Please advise.

## 2023-02-12 NOTE — Telephone Encounter (Signed)
Per last OV on 12/31/22 w/Ernest: Today during her visit her blood pressure was well-controlled at 118/60 in the left arm and 110/70 in the right arm. She did bring her home cuff in to compare to our readings that read 139/72.  1.  Essential hypertension: -Patient's blood pressure today was well-controlled today at 118/60 -She reported complaints of lower extremity swelling and dizziness with standing.  We will decrease her amlodipine to 2.5 mg daily. -Continue Diovan 320 mg daily -She will continue to monitor her blood pressures at home and will contact our office if blood pressures are elevated.  Returned call to patient to see if she got a new monitor after this visit since her's read so much off from our manual check. Spoke with daughter on DPR who states they did get a new monitor, but she does still worry about the accuracy. They have a CMA coming out to their house tomorrow to check on patient and she will do manual BP check and they will verify if the readings they're getting are in line with a manual BP check. Will call back with update, then we can address possible medication dose change.

## 2023-02-14 NOTE — Telephone Encounter (Signed)
Returned call to patient to see how BP was yesterday when CMA checked it 6/19  166/84 (states she had a prepared meal, possibly meatloaf, for supper the night before 6/20  142/60(CMA reading)   146/79 her kit at home 6/21 141/78 Verified that she is taking Valsartan 320mg  daily and amlodipine 2.5mg  daily. Noticed pressure was increasing 01/28/23 and remains in the 150-160's systolic. Does state that dizziness has improved-attributes this to vertigo. Patient states she's been having these boxed meals for years, so nothing new there. She did have a popping in her neck (cervical region) about two weeks ago and caused her vertigo to worsen drastically, but has been seen by MD and is working on this. Will route to [facility] for thoughts on BP medication regimen.

## 2023-02-14 NOTE — Telephone Encounter (Signed)
Gaston Islam., NP  Lars Mage, RN Caller: Unspecified (3 days ago,  4:35 PM) I would recommend staying on current regimen for now and continuing to monitor.  I think reducing her salt intake will make a difference in her elevated BP.  Please let me know if you have any additional questions. Robin Searing, NP  Called and provided patient with above comments. She believes now that she had a stroke just prior to coming to this office visit. Informed her that the stroke mentioned in her paperwork from this visit was an event that occurred in 2017. She doesn't remember this, ever going to hospital, nothing at all. Advised her that if she truly feels she had another stroke, she should call her PCP for visit to have check-up and get neurology referral. Patient says things repeatedly and states a different dose of medication than earlier conversation-concerning for dementia. Daughter will continue to monitor.

## 2023-02-28 ENCOUNTER — Encounter: Payer: Self-pay | Admitting: Cardiovascular Disease

## 2023-02-28 NOTE — Telephone Encounter (Signed)
Error

## 2023-03-03 NOTE — Therapy (Signed)
OUTPATIENT PHYSICAL THERAPY NEURO EVALUATION   Patient Name: Melanie [name] MRN: [id number] DOB:01/18/1924, 89 y.o., female Today's Date: 03/04/2023   PCP: Gwen Pounds  REFERRING PROVIDER: Creola Corn, Judie Petit   END OF SESSION:  PT End of Session - 03/04/23 1504     Visit Number 1    Number of Visits 9    Date for PT Re-Evaluation 04/01/23    Authorization Type [employer] Medicare    PT Start Time 1405    PT Stop Time 1443    PT Time Calculation (min) 38 min    Activity Tolerance Patient tolerated treatment well;Other (comment)   tremors/dizziness   Behavior During Therapy [facility] for tasks assessed/performed;Anxious             Past Medical History:  Diagnosis Date   Bruises easily    on Aspirin;on hold for surgery   Carotid stenosis    CHF (congestive heart failure) (HCC)    diastolic dysfunction   Chronic back pain    stenosis   Chronic sinusitis    Diverticulosis    Esophageal stricture    GERD (gastroesophageal reflux disease)    takes Protonix daily   Heart murmur    as a child   Hemorrhoids    History of colon polyps    Hypertension    takes Amlodipine,HCTZ,and Metoprolol   Insomnia    Mitral insufficiency and aortic stenosis    Palpitations    Peripheral edema    Schatzki's ring    Scoliosis    Stroke [facility])    Past Surgical History:  Procedure Laterality Date   BREAST ENHANCEMENT SURGERY     CHOLECYSTECTOMY     COLONOSCOPY     DILATION AND CURETTAGE OF UTERUS     epidural injections     ESOPHAGOGASTRODUODENOSCOPY     EYE SURGERY     left cataract removed   LUMBAR LAMINECTOMY/DECOMPRESSION MICRODISCECTOMY Left 01/26/2013   Procedure: LUMBAR LAMINECTOMY/DECOMPRESSION MICRODISCECTOMY 1 LEVEL;  Surgeon: Barnett Abu, MD;  Location: [facility];  Service: Neurosurgery;  Laterality: Left;  Left Lumbar four-five Laminectomy/Foraminotomy   steroid injections     x 3   TOTAL HIP ARTHROPLASTY Right    Patient Active Problem List   Diagnosis Date Noted    Congestive heart failure (HCC) 02/18/2022   Nausea vomiting and diarrhea 02/18/2022   Valvular heart disease 02/18/2022   Syncope 02/18/2022   Palpitations    Acute CVA (cerebrovascular accident) (HCC) 07/18/2016   TIA (transient ischemic attack)    Hyperlipidemia    Cerebrovascular accident (CVA) due to embolism of left middle cerebral artery (HCC)    Hemispheric carotid artery syndrome 07/17/2016   Essential hypertension 07/17/2016   Hypokalemia 07/17/2016   Mitral regurgitation 02/24/2015   Spinal stenosis, lumbar region, with neurogenic claudication 01/27/2013   Lumbar scoliosis 01/27/2013    ONSET DATE: couple months   REFERRING DIAG: R42 (ICD-10-CM) - Dizziness and giddiness  THERAPY DIAG:  Dizziness and giddiness  BPPV (benign paroxysmal positional vertigo), right  Rationale for Evaluation and Treatment: Rehabilitation  SUBJECTIVE:  SUBJECTIVE STATEMENT: Caregiver reports that patient had an appointment with MD's nurse who identified her symptoms as dizziness. Patient reports that she believes that the intervertebral arteries in her neck have been ruptured and believes that this is why she is dizzy. Endorses chronic neck pain and scoliosis. Reports imbalance which is new for her. Denies head trauma, infection/illness, tinnitus, otalgia, migraines. Reports 1 episode of "seeing stars" , nearly syncope, and trouble speaking. Had to get new hearing aids a month ago d/t worsening hearing. Caregiver reports that she gets the shakes when she gets dizzy when sitting out of bed in the AM.   Pt accompanied by:  caregiver- Jeanie Cooks  PERTINENT HISTORY: Carotid stenosis, CHF, GERD, HTN, palpitations, scoliosis, stroke, L lumbar laminectomy 2014, R THA  PAIN:  Are you having pain? No  PRECAUTIONS:  Fall  WEIGHT BEARING RESTRICTIONS: No  FALLS: Has patient fallen in last 6 months? No  LIVING ENVIRONMENT: Lives with: lives with their daughter Lives in: House/apartment Stairs:  4 steps to enter, chair lift to 2nd floor Has following equipment at home: Counselling psychologist, Environmental consultant - 4 wheeled, Tour manager, and Grab bars  PLOF: Independent with basic ADLs; caregiver comes 2 days/week   PATIENT GOALS: improve dizziness  OBJECTIVE:   DIAGNOSTIC FINDINGS: none recent  COGNITION: Overall cognitive status: Within functional limits for tasks assessed   SENSATION: Reports B foot N/T d/t neuropathy and a bad back  POSTURE: rounded shoulders, forward head, and increased thoracic kyphosis   GAIT: Gait pattern: slow, guarded, trunk flexed; holding onto caregiver's arm Assistive device utilized: Quad cane small base Level of assistance: Min A   VESTIBULAR ASSESSMENT:  GENERAL OBSERVATION: pt wears trifocals   OCULOMOTOR EXAM:  Ocular Alignment: normal  Ocular ROM: No Limitations (possibly some limitation inferior gaze)  Spontaneous Nystagmus: absent  Gaze-Induced Nystagmus: absent  Smooth Pursuits: saccades vertically  Saccades: intact (possibly some limitation inferior gaze)    VESTIBULAR - OCULAR REFLEX:   Slow VOR: Comment: c/o "feels funny" and blurred vision horizontal and vertical  VOR Cancellation: Normal c/o a little woozy  Head-Impulse Test: unable to test d/t pt specifically requesting not to "jerk my head around"      POSITIONAL TESTING:   Right Sidelying: R upbeating torsional nystagmus ; Duration: 15 sec *pt began to have tremors and notes feeling "in space." This resolved with supine rest break.   *138/75 mmHg, 67 bpm, 98%      TODAY'S TREATMENT:                                                                                                                              DATE: 03/04/23    PATIENT EDUCATION: Education details: edu on stroke s/sx and  to present to ED if they occur; provided handout on BPPV; prognosis, POC Person educated: Patient, caregiver  Education method: Explanation, Demonstration, Tactile cues, Verbal cues, and Handouts Education comprehension: verbalized understanding and returned demonstration  HOME EXERCISE PROGRAM: N/A  GOALS: Goals reviewed with patient? Yes  SHORT TERM GOALS: Target date: 03/18/2023  Patient to be independent with initial HEP. Baseline: HEP initiated Goal status: INITIAL    LONG TERM GOALS: Target date: 04/01/2023  Patient to be independent with advanced HEP. Baseline: Not yet initiated  Goal status: INITIAL  Patient to report 0/10 dizziness with standing vertical and horizontal VOR for 30 seconds. Baseline: Unable Goal status: INITIAL  Patient will report 0/10 dizziness with bed mobility.  Baseline: Symptomatic  Goal status: INITIAL  Patient to report resolution of dizziness.  Baseline: - Goal status: INITIAL  Patient to score at least 45/56 on Berg in order to decrease risk of falls. Baseline: NT Goal status: INITIAL   ASSESSMENT:  CLINICAL IMPRESSION:   Patient is a 89 y/o F presenting to OPPT with c/o dizziness for the past couple months. Reports imbalance, worsening hearing, dizziness and "the shakes" when sitting up out of bed in the AM, and describes an episode of "seeing stars", near-syncope, and trouble speaking. Denies head trauma, infection/illness, tinnitus, otalgia, migraines. Patient adamant that the intervertebral arteries in her neck have ruptured and that this is the cause of her dizziness- there is no mention of this in patient's chart that is visible to me and she denies red flag symptoms today.  Patient today with gait deviations and imbalance, some possible limited inferior ocular ROM, c/o symptoms with VOR and VOR cancellation, and positive R sidelying test. Patient became anxious and started to display tremors upon siting up from positional testing.  This was resolved with laying supine to allow symptoms to settle. Vitals normal. No other complaints upon leaving. Would benefit from skilled PT services 1-2x/week for 4 weeks to address aforementioned impairments in order to optimize level of function.    OBJECTIVE IMPAIRMENTS: Abnormal gait, decreased activity tolerance, decreased balance, dizziness, and postural dysfunction.   ACTIVITY LIMITATIONS: carrying, lifting, bending, standing, squatting, sleeping, stairs, transfers, bed mobility, bathing, toileting, dressing, reach over head, hygiene/grooming, and locomotion level  PARTICIPATION LIMITATIONS: meal prep, shopping, community activity, and church  PERSONAL FACTORS: Age, Behavior pattern, Fitness, Past/current experiences, Time since onset of injury/illness/exacerbation, Transportation, and 3+ comorbidities: Carotid stenosis, CHF, GERD, HTN, palpitations, scoliosis, stroke, L lumbar laminectomy 2014, R THA  are also affecting patient's functional outcome.   REHAB POTENTIAL: Good  CLINICAL DECISION MAKING: Evolving/moderate complexity  EVALUATION COMPLEXITY: Moderate  PLAN:  PT FREQUENCY: 1-2x/week  PT DURATION: 4 weeks  PLANNED INTERVENTIONS: Therapeutic exercises, Therapeutic activity, Neuromuscular re-education, Balance training, Gait training, Patient/Family education, Self Care, Joint mobilization, Stair training, Vestibular training, Canalith repositioning, Aquatic Therapy, Dry Needling, Electrical stimulation, Cryotherapy, Moist heat, Taping, Manual therapy, and Re-evaluation  PLAN FOR NEXT SESSION: Sharlene Motts, finish positional testing and treat; initiate HEP for balance/VOR/habituation as tolerated   Anette Guarneri, PT, DPT 03/04/23 5:23 PM  [facility] [address], [address] [place], [place] [address] Phone # [phone] Fax # [phone]

## 2023-03-04 ENCOUNTER — Encounter: Payer: Self-pay | Admitting: Physical Therapy

## 2023-03-04 ENCOUNTER — Ambulatory Visit: Payer: Medicare Other | Attending: Internal Medicine | Admitting: Physical Therapy

## 2023-03-04 ENCOUNTER — Other Ambulatory Visit: Payer: Self-pay

## 2023-03-04 DIAGNOSIS — R42 Dizziness and giddiness: Secondary | ICD-10-CM | POA: Diagnosis present

## 2023-03-04 DIAGNOSIS — H8111 Benign paroxysmal vertigo, right ear: Secondary | ICD-10-CM | POA: Insufficient documentation

## 2023-03-06 NOTE — Therapy (Signed)
OUTPATIENT PHYSICAL THERAPY NEURO TREATMENT   Patient Name: Melanie [name] MRN: [id number] DOB:11/08/1923, 89 y.o., female Today's Date: 03/07/2023   PCP: Gwen Pounds  REFERRING PROVIDER: Creola Corn, Judie Petit   END OF SESSION:  PT End of Session - 03/07/23 1101     Visit Number 2    Number of Visits 9    Date for PT Re-Evaluation 04/01/23    Authorization Type [employer] Medicare    PT Start Time 1020    PT Stop Time 1059    PT Time Calculation (min) 39 min    Equipment Utilized During Treatment Gait belt    Activity Tolerance Patient tolerated treatment well    Behavior During Therapy WFL for tasks assessed/performed;Anxious              Past Medical History:  Diagnosis Date   Bruises easily    on Aspirin;on hold for surgery   Carotid stenosis    CHF (congestive heart failure) (HCC)    diastolic dysfunction   Chronic back pain    stenosis   Chronic sinusitis    Diverticulosis    Esophageal stricture    GERD (gastroesophageal reflux disease)    takes Protonix daily   Heart murmur    as a child   Hemorrhoids    History of colon polyps    Hypertension    takes Amlodipine,HCTZ,and Metoprolol   Insomnia    Mitral insufficiency and aortic stenosis    Palpitations    Peripheral edema    Schatzki's ring    Scoliosis    Stroke [facility])    Past Surgical History:  Procedure Laterality Date   BREAST ENHANCEMENT SURGERY     CHOLECYSTECTOMY     COLONOSCOPY     DILATION AND CURETTAGE OF UTERUS     epidural injections     ESOPHAGOGASTRODUODENOSCOPY     EYE SURGERY     left cataract removed   LUMBAR LAMINECTOMY/DECOMPRESSION MICRODISCECTOMY Left 01/26/2013   Procedure: LUMBAR LAMINECTOMY/DECOMPRESSION MICRODISCECTOMY 1 LEVEL;  Surgeon: Barnett Abu, MD;  Location: [facility];  Service: Neurosurgery;  Laterality: Left;  Left Lumbar four-five Laminectomy/Foraminotomy   steroid injections     x 3   TOTAL HIP ARTHROPLASTY Right    Patient Active Problem List   Diagnosis  Date Noted   Congestive heart failure (HCC) 02/18/2022   Nausea vomiting and diarrhea 02/18/2022   Valvular heart disease 02/18/2022   Syncope 02/18/2022   Palpitations    Acute CVA (cerebrovascular accident) (HCC) 07/18/2016   TIA (transient ischemic attack)    Hyperlipidemia    Cerebrovascular accident (CVA) due to embolism of left middle cerebral artery (HCC)    Hemispheric carotid artery syndrome 07/17/2016   Essential hypertension 07/17/2016   Hypokalemia 07/17/2016   Mitral regurgitation 02/24/2015   Spinal stenosis, lumbar region, with neurogenic claudication 01/27/2013   Lumbar scoliosis 01/27/2013    ONSET DATE: couple months   REFERRING DIAG: R42 (ICD-10-CM) - Dizziness and giddiness  THERAPY DIAG:  Dizziness and giddiness  BPPV (benign paroxysmal positional vertigo), right  Rationale for Evaluation and Treatment: Rehabilitation  SUBJECTIVE:  SUBJECTIVE STATEMENT: Dizziness is a speck better. Been having a pain through the R temple- this has been going on for a while but in the past 2 weeks it has been work. Reports sensation of pressure in back of head and neck.   Pt accompanied by: self  PERTINENT HISTORY: Carotid stenosis, CHF, GERD, HTN, palpitations, scoliosis, stroke, L lumbar laminectomy 2014, R THA  PAIN:  Are you having pain? No  PRECAUTIONS: Fall  WEIGHT BEARING RESTRICTIONS: No  FALLS: Has patient fallen in last 6 months? No  LIVING ENVIRONMENT: Lives with: lives with their daughter Lives in: House/apartment Stairs:  4 steps to enter, chair lift to 2nd floor Has following equipment at home: Counselling psychologist, Environmental consultant - 4 wheeled, Tour manager, and Grab bars  PLOF: Independent with basic ADLs; caregiver comes 2 days/week   PATIENT GOALS: improve  dizziness  OBJECTIVE:        TODAY'S TREATMENT: 03/07/23 Activity Comments  Berg  48/56  L sidelying test  negative  R roll test Negative   L roll test Subtle L upbeating torsional nystagmus persisting; pt required rest break after this d/t feeling like pulse elevated after testing   R DH L upbeating torsional nystagmus lasting 30 sec  R epley  Tolerated well       [facility] PT Assessment - 03/07/23 0001       Standardized Balance Assessment   Standardized Balance Assessment Berg Balance Test      Berg Balance Test   Sit to Stand Able to stand without using hands and stabilize independently    Standing Unsupported Able to stand safely 2 minutes    Sitting with Back Unsupported but Feet Supported on Floor or Stool Able to sit safely and securely 2 minutes    Stand to Sit Sits safely with minimal use of hands    Transfers Able to transfer safely, minor use of hands    Standing Unsupported with Eyes Closed Able to stand 10 seconds safely    Standing Unsupported with Feet Together Able to place feet together independently and stand 1 minute safely    From Standing, Reach Forward with Outstretched Arm Can reach confidently >25 cm (10")    From Standing Position, Pick up Object from Floor Able to pick up shoe safely and easily    From Standing Position, Turn to Look Behind Over each Shoulder Looks behind from both sides and weight shifts well    Turn 360 Degrees Able to turn 360 degrees safely but slowly    Standing Unsupported, Alternately Place Feet on Step/Stool Able to complete 4 steps without aid or supervision    Standing Unsupported, One Foot in Front Able to take small step independently and hold 30 seconds    Standing on One Leg Able to lift leg independently and hold equal to or more than 3 seconds    Total Score 48               PATIENT EDUCATION: Education details: detailed review and video of epley for max pt understanding and comfort; edu on post-CRM expectations   Person educated: Patient Education method: Explanation, Demonstration, Tactile cues, and Verbal cues Education comprehension: verbalized understanding     Below measures were taken at time of initial evaluation unless otherwise specified:   DIAGNOSTIC FINDINGS: none recent  COGNITION: Overall cognitive status: Within functional limits for tasks assessed   SENSATION: Reports B foot N/T d/t neuropathy and a bad back  POSTURE: rounded shoulders,  forward head, and increased thoracic kyphosis   GAIT: Gait pattern: slow, guarded, trunk flexed; holding onto caregiver's arm Assistive device utilized: Quad cane small base Level of assistance: Min A   VESTIBULAR ASSESSMENT:  GENERAL OBSERVATION: pt wears trifocals   OCULOMOTOR EXAM:  Ocular Alignment: normal  Ocular ROM: No Limitations (possibly some limitation inferior gaze)  Spontaneous Nystagmus: absent  Gaze-Induced Nystagmus: absent  Smooth Pursuits: saccades vertically  Saccades: intact (possibly some limitation inferior gaze)    VESTIBULAR - OCULAR REFLEX:   Slow VOR: Comment: c/o "feels funny" and blurred vision horizontal and vertical  VOR Cancellation: Normal c/o a little woozy  Head-Impulse Test: unable to test d/t pt specifically requesting not to "jerk my head around"      POSITIONAL TESTING:   Right Sidelying: R upbeating torsional nystagmus ; Duration: 15 sec *pt began to have tremors and notes feeling "in space." This resolved with supine rest break.   *138/75 mmHg, 67 bpm, 98%      TODAY'S TREATMENT:                                                                                                                              DATE: 03/04/23    PATIENT EDUCATION: Education details: edu on stroke s/sx and to present to ED if they occur; provided handout on BPPV; prognosis, POC Person educated: Patient, caregiver  Education method: Explanation, Demonstration, Tactile cues, Verbal cues, and  Handouts Education comprehension: verbalized understanding and returned demonstration  HOME EXERCISE PROGRAM: N/A  GOALS: Goals reviewed with patient? Yes  SHORT TERM GOALS: Target date: 03/18/2023  Patient to be independent with initial HEP. Baseline: HEP initiated Goal status: IN PROGRESS    LONG TERM GOALS: Target date: 04/01/2023  Patient to be independent with advanced HEP. Baseline: Not yet initiated  Goal status: IN PROGRESS  Patient to report 0/10 dizziness with standing vertical and horizontal VOR for 30 seconds. Baseline: Unable Goal status: IN PROGRESS  Patient will report 0/10 dizziness with bed mobility.  Baseline: Symptomatic  Goal status: IN PROGRESS  Patient to report resolution of dizziness.  Baseline: - Goal status: IN PROGRESS  Patient to score at least 45/56 on Berg in order to decrease risk of falls. Baseline: NT Goal status: IN PROGRESS   ASSESSMENT:  CLINICAL IMPRESSION:   Patient arrived to session with report of some chronic headache and neck pain, worsening in the last 2 weeks. No pain at start of session. Balance testing revealed decreased risk of falls. Positional testing revealed R posterior canalithiasis, treated with R Epley x1. Retest was negative. May need to f/u with treatment of  ear d/t some dizziness and nystagmus with L roll test. Patient was escorted to front office to family member. No complaints besides mild unsteadiness upon leaving.  OBJECTIVE IMPAIRMENTS: Abnormal gait, decreased activity tolerance, decreased balance, dizziness, and postural dysfunction.   ACTIVITY LIMITATIONS: carrying, lifting, bending, standing, squatting, sleeping, stairs, transfers, bed mobility, bathing,  toileting, dressing, reach over head, hygiene/grooming, and locomotion level  PARTICIPATION LIMITATIONS: meal prep, shopping, community activity, and church  PERSONAL FACTORS: Age, Behavior pattern, Fitness, Past/current experiences, Time since onset  of injury/illness/exacerbation, Transportation, and 3+ comorbidities: Carotid stenosis, CHF, GERD, HTN, palpitations, scoliosis, stroke, L lumbar laminectomy 2014, R THA  are also affecting patient's functional outcome.   REHAB POTENTIAL: Good  CLINICAL DECISION MAKING: Evolving/moderate complexity  EVALUATION COMPLEXITY: Moderate  PLAN:  PT FREQUENCY: 1-2x/week  PT DURATION: 4 weeks  PLANNED INTERVENTIONS: Therapeutic exercises, Therapeutic activity, Neuromuscular re-education, Balance training, Gait training, Patient/Family education, Self Care, Joint mobilization, Stair training, Vestibular training, Canalith repositioning, Aquatic Therapy, Dry Needling, Electrical stimulation, Cryotherapy, Moist heat, Taping, Manual therapy, and Re-evaluation  PLAN FOR NEXT SESSION: recheck positional testing;  initiate HEP for balance/VOR/habituation as tolerated   Anette Guarneri, PT, DPT 03/07/23 11:02 AM   Outpatient Rehab at [facility] [address], [address] [place], [place] [address] Phone # [phone] Fax # [phone]

## 2023-03-07 ENCOUNTER — Encounter: Payer: Self-pay | Admitting: Physical Therapy

## 2023-03-07 ENCOUNTER — Ambulatory Visit: Payer: Medicare Other | Admitting: Physical Therapy

## 2023-03-07 DIAGNOSIS — R42 Dizziness and giddiness: Secondary | ICD-10-CM | POA: Diagnosis not present

## 2023-03-07 DIAGNOSIS — H8111 Benign paroxysmal vertigo, right ear: Secondary | ICD-10-CM

## 2023-03-10 NOTE — Therapy (Signed)
OUTPATIENT PHYSICAL THERAPY NEURO TREATMENT   Patient Name: Melanie [name] MRN: [id number] DOB:1923-12-10, 89 y.o., female Today's Date: 03/11/2023   PCP: Gwen Pounds  REFERRING PROVIDER: Creola Corn, Judie Petit   END OF SESSION:  PT End of Session - 03/11/23 1529     Visit Number 3    Number of Visits 9    Date for PT Re-Evaluation 04/01/23    Authorization Type [employer] Medicare    PT Start Time 1449    PT Stop Time 1531    PT Time Calculation (min) 42 min    Activity Tolerance Patient tolerated treatment well    Behavior During Therapy WFL for tasks assessed/performed               Past Medical History:  Diagnosis Date   Bruises easily    on Aspirin;on hold for surgery   Carotid stenosis    CHF (congestive heart failure) (HCC)    diastolic dysfunction   Chronic back pain    stenosis   Chronic sinusitis    Diverticulosis    Esophageal stricture    GERD (gastroesophageal reflux disease)    takes Protonix daily   Heart murmur    as a child   Hemorrhoids    History of colon polyps    Hypertension    takes Amlodipine,HCTZ,and Metoprolol   Insomnia    Mitral insufficiency and aortic stenosis    Palpitations    Peripheral edema    Schatzki's ring    Scoliosis    Stroke [facility])    Past Surgical History:  Procedure Laterality Date   BREAST ENHANCEMENT SURGERY     CHOLECYSTECTOMY     COLONOSCOPY     DILATION AND CURETTAGE OF UTERUS     epidural injections     ESOPHAGOGASTRODUODENOSCOPY     EYE SURGERY     left cataract removed   LUMBAR LAMINECTOMY/DECOMPRESSION MICRODISCECTOMY Left 01/26/2013   Procedure: LUMBAR LAMINECTOMY/DECOMPRESSION MICRODISCECTOMY 1 LEVEL;  Surgeon: Barnett Abu, MD;  Location: [facility];  Service: Neurosurgery;  Laterality: Left;  Left Lumbar four-five Laminectomy/Foraminotomy   steroid injections     x 3   TOTAL HIP ARTHROPLASTY Right    Patient Active Problem List   Diagnosis Date Noted   Congestive heart failure (HCC) 02/18/2022    Nausea vomiting and diarrhea 02/18/2022   Valvular heart disease 02/18/2022   Syncope 02/18/2022   Palpitations    Acute CVA (cerebrovascular accident) (HCC) 07/18/2016   TIA (transient ischemic attack)    Hyperlipidemia    Cerebrovascular accident (CVA) due to embolism of left middle cerebral artery (HCC)    Hemispheric carotid artery syndrome 07/17/2016   Essential hypertension 07/17/2016   Hypokalemia 07/17/2016   Mitral regurgitation 02/24/2015   Spinal stenosis, lumbar region, with neurogenic claudication 01/27/2013   Lumbar scoliosis 01/27/2013    ONSET DATE: couple months   REFERRING DIAG: R42 (ICD-10-CM) - Dizziness and giddiness  THERAPY DIAG:  Dizziness and giddiness  BPPV (benign paroxysmal positional vertigo), right  Rationale for Evaluation and Treatment: Rehabilitation  SUBJECTIVE:  SUBJECTIVE STATEMENT: I'm wobbly. Report that she felt better after last session until  she bent over to feed the chipmunks and squirrels.   Pt accompanied by: self  PERTINENT HISTORY: Carotid stenosis, CHF, GERD, HTN, palpitations, scoliosis, stroke, L lumbar laminectomy 2014, R THA  PAIN:  Are you having pain? No; "nothing besides my back pain which is there always"  PRECAUTIONS: Fall  WEIGHT BEARING RESTRICTIONS: No  FALLS: Has patient fallen in last 6 months? No  LIVING ENVIRONMENT: Lives with: lives with their daughter Lives in: House/apartment Stairs:  4 steps to enter, chair lift to 2nd floor Has following equipment at home: Counselling psychologist, Environmental consultant - 4 wheeled, Tour manager, and Grab bars  PLOF: Independent with basic ADLs; caregiver comes 2 days/week   PATIENT GOALS: improve dizziness  OBJECTIVE:      TODAY'S TREATMENT: 03/11/23 Activity Comments  R sidelying test  Negative; c/o dizziness upon sitting up and laying down   L sidelying test L upbeating torsional nystagmus lasting 20 sec  R roll test C/o dizziness; possible R upbeating torsional nystagmus  L roll test L upbeating torsional nystagmus lasting 30 sec  L DH Downbeating nystagmus lasting ~ 1 min without any red flag symptoms   L epley  Upon R head turn, downbeating nystagmus lasting 20 sec without any red flag symptoms ; tolerated well  Sitting head turns to targets 30" C/o mild dizziness  Sitting head nods to targets 30" C/o mild dizziness and c/o some diplopia when looking overhead   L sidelying test Unable to visualize nystagmus clearly d/t pt closing eyes but symptoms appeared to be improved  Vitals at end of session 97% spO2, 63 bpm, 157/79 mmHg (reports this is typical for her)        HOME EXERCISE PROGRAM Last updated: 03/11/23 Access Code: [email or web address] URL: [email or web address] Date: 03/11/2023 Prepared by: [facility] - Outpatient  Rehab - [facility]  Exercises - Brandt-Daroff Vestibular Exercise  - 1 x daily - 5 x weekly - 2 sets - 3-5 reps - Standing with Head Rotation  - 1 x daily - 5 x weekly - 2 sets - 30 sec hold - Standing with Head Nod  - 1 x daily - 5 x weekly - 2 sets - 30 sec hold   PATIENT EDUCATION: Education details: edu on performing HEP with daughter or caregiver only Person educated: Patient Education method: Programmer, multimedia, Demonstration, Tactile cues, Verbal cues, and Handouts Education comprehension: verbalized understanding and returned demonstration   Below measures were taken at time of initial evaluation unless otherwise specified:   DIAGNOSTIC FINDINGS: none recent  COGNITION: Overall cognitive status: Within functional limits for tasks assessed   SENSATION: Reports B foot N/T d/t neuropathy and a bad back  POSTURE: rounded shoulders, forward head, and increased thoracic kyphosis   GAIT: Gait pattern: slow, guarded, trunk  flexed; holding onto caregiver's arm Assistive device utilized: Quad cane small base Level of assistance: Min A   VESTIBULAR ASSESSMENT:  GENERAL OBSERVATION: pt wears trifocals   OCULOMOTOR EXAM:  Ocular Alignment: normal  Ocular ROM: No Limitations (possibly some limitation inferior gaze)  Spontaneous Nystagmus: absent  Gaze-Induced Nystagmus: absent  Smooth Pursuits: saccades vertically  Saccades: intact (possibly some limitation inferior gaze)    VESTIBULAR - OCULAR REFLEX:   Slow VOR: Comment: c/o "feels funny" and blurred vision horizontal and vertical  VOR Cancellation: Normal c/o a little woozy  Head-Impulse Test: unable to test d/t pt specifically requesting not to "jerk my  head around"      POSITIONAL TESTING:   Right Sidelying: R upbeating torsional nystagmus ; Duration: 15 sec *pt began to have tremors and notes feeling "in space." This resolved with supine rest break.   *138/75 mmHg, 67 bpm, 98%      TODAY'S TREATMENT:                                                                                                                              DATE: 03/04/23    PATIENT EDUCATION: Education details: edu on stroke s/sx and to present to ED if they occur; provided handout on BPPV; prognosis, POC Person educated: Patient, caregiver  Education method: Explanation, Demonstration, Tactile cues, Verbal cues, and Handouts Education comprehension: verbalized understanding and returned demonstration  HOME EXERCISE PROGRAM: N/A  GOALS: Goals reviewed with patient? Yes  SHORT TERM GOALS: Target date: 03/18/2023  Patient to be independent with initial HEP. Baseline: HEP initiated Goal status: IN PROGRESS    LONG TERM GOALS: Target date: 04/01/2023  Patient to be independent with advanced HEP. Baseline: Not yet initiated  Goal status: IN PROGRESS  Patient to report 0/10 dizziness with standing vertical and horizontal VOR for 30 seconds. Baseline: Unable Goal  status: IN PROGRESS  Patient will report 0/10 dizziness with bed mobility.  Baseline: Symptomatic  Goal status: IN PROGRESS  Patient to report resolution of dizziness.  Baseline: - Goal status: IN PROGRESS  Patient to score at least 45/56 on Berg in order to decrease risk of falls. Baseline: NT Goal status: IN PROGRESS   ASSESSMENT:  CLINICAL IMPRESSION:   Patient arrived to session with report of improvement in dizziness until she bent over to feed the chipmunks; notes an episode of dizziness occurred at this time. Positional testing revealed remaining symptoms when testing R ear, however nystagmus and dizziness evident on L side as well today. Treated with L Epley. Nystagmus in testing position revealed down beating nystagmus without any red flag symptoms (possibly indicating central involvement or anterior canalithiasis), thus proceeded with CRM. Patient tolerated this well and reported edu on HEP for habituation. Patient reported understanding and reported no dizziness at end of session.   OBJECTIVE IMPAIRMENTS: Abnormal gait, decreased activity tolerance, decreased balance, dizziness, and postural dysfunction.   ACTIVITY LIMITATIONS: carrying, lifting, bending, standing, squatting, sleeping, stairs, transfers, bed mobility, bathing, toileting, dressing, reach over head, hygiene/grooming, and locomotion level  PARTICIPATION LIMITATIONS: meal prep, shopping, community activity, and church  PERSONAL FACTORS: Age, Behavior pattern, Fitness, Past/current experiences, Time since onset of injury/illness/exacerbation, Transportation, and 3+ comorbidities: Carotid stenosis, CHF, GERD, HTN, palpitations, scoliosis, stroke, L lumbar laminectomy 2014, R THA  are also affecting patient's functional outcome.   REHAB POTENTIAL: Good  CLINICAL DECISION MAKING: Evolving/moderate complexity  EVALUATION COMPLEXITY: Moderate  PLAN:  PT FREQUENCY: 1-2x/week  PT DURATION: 4 weeks  PLANNED  INTERVENTIONS: Therapeutic exercises, Therapeutic activity, Neuromuscular re-education, Balance training, Gait training, Patient/Family education, Self Care, Joint mobilization, Stair  training, Vestibular training, Canalith repositioning, Aquatic Therapy, Dry Needling, Electrical stimulation, Cryotherapy, Moist heat, Taping, Manual therapy, and Re-evaluation  PLAN FOR NEXT SESSION: recheck positional testing;  initiate HEP for balance/VOR/habituation as tolerated   Anette Guarneri, PT, DPT 03/11/23 3:35 PM  [facility] at [facility] [address] [place], [place] [address] Phone # [phone] Fax # [phone]

## 2023-03-11 ENCOUNTER — Encounter: Payer: Self-pay | Admitting: Physical Therapy

## 2023-03-11 ENCOUNTER — Ambulatory Visit: Payer: Medicare Other | Admitting: Physical Therapy

## 2023-03-11 DIAGNOSIS — H8111 Benign paroxysmal vertigo, right ear: Secondary | ICD-10-CM

## 2023-03-11 DIAGNOSIS — R42 Dizziness and giddiness: Secondary | ICD-10-CM

## 2023-03-17 NOTE — Therapy (Signed)
OUTPATIENT PHYSICAL THERAPY NEURO TREATMENT   Patient Name: Melanie [name] MRN: [id number] DOB:Sep 13, 1923, 89 y.o., female Today's Date: 03/18/2023   PCP: Gwen Pounds  REFERRING PROVIDER: Creola Corn, Judie Petit   END OF SESSION:  PT End of Session - 03/18/23 1608     Visit Number 4    Number of Visits 9    Date for PT Re-Evaluation 04/01/23    Authorization Type [employer] Medicare    PT Start Time 1537   pt late   PT Stop Time 1614    PT Time Calculation (min) 37 min    Equipment Utilized During Treatment Gait belt    Activity Tolerance Patient tolerated treatment well    Behavior During Therapy WFL for tasks assessed/performed                Past Medical History:  Diagnosis Date   Bruises easily    on Aspirin;on hold for surgery   Carotid stenosis    CHF (congestive heart failure) (HCC)    diastolic dysfunction   Chronic back pain    stenosis   Chronic sinusitis    Diverticulosis    Esophageal stricture    GERD (gastroesophageal reflux disease)    takes Protonix daily   Heart murmur    as a child   Hemorrhoids    History of colon polyps    Hypertension    takes Amlodipine,HCTZ,and Metoprolol   Insomnia    Mitral insufficiency and aortic stenosis    Palpitations    Peripheral edema    Schatzki's ring    Scoliosis    Stroke [facility])    Past Surgical History:  Procedure Laterality Date   BREAST ENHANCEMENT SURGERY     CHOLECYSTECTOMY     COLONOSCOPY     DILATION AND CURETTAGE OF UTERUS     epidural injections     ESOPHAGOGASTRODUODENOSCOPY     EYE SURGERY     left cataract removed   LUMBAR LAMINECTOMY/DECOMPRESSION MICRODISCECTOMY Left 01/26/2013   Procedure: LUMBAR LAMINECTOMY/DECOMPRESSION MICRODISCECTOMY 1 LEVEL;  Surgeon: Barnett Abu, MD;  Location: [facility];  Service: Neurosurgery;  Laterality: Left;  Left Lumbar four-five Laminectomy/Foraminotomy   steroid injections     x 3   TOTAL HIP ARTHROPLASTY Right    Patient Active Problem List    Diagnosis Date Noted   Congestive heart failure (HCC) 02/18/2022   Nausea vomiting and diarrhea 02/18/2022   Valvular heart disease 02/18/2022   Syncope 02/18/2022   Palpitations    Acute CVA (cerebrovascular accident) (HCC) 07/18/2016   TIA (transient ischemic attack)    Hyperlipidemia    Cerebrovascular accident (CVA) due to embolism of left middle cerebral artery (HCC)    Hemispheric carotid artery syndrome 07/17/2016   Essential hypertension 07/17/2016   Hypokalemia 07/17/2016   Mitral regurgitation 02/24/2015   Spinal stenosis, lumbar region, with neurogenic claudication 01/27/2013   Lumbar scoliosis 01/27/2013    ONSET DATE: couple months   REFERRING DIAG: R42 (ICD-10-CM) - Dizziness and giddiness  THERAPY DIAG:  Dizziness and giddiness  BPPV (benign paroxysmal positional vertigo), right  Rationale for Evaluation and Treatment: Rehabilitation  SUBJECTIVE:  SUBJECTIVE STATEMENT: "I think you did good. I'm not in vertigo anymore." "Have been walking without my cane sometimes."   Pt accompanied by: self  PERTINENT HISTORY: Carotid stenosis, CHF, GERD, HTN, palpitations, scoliosis, stroke, L lumbar laminectomy 2014, R THA  PAIN:  Are you having pain? No  PRECAUTIONS: Fall  WEIGHT BEARING RESTRICTIONS: No  FALLS: Has patient fallen in last 6 months? No  LIVING ENVIRONMENT: Lives with: lives with their daughter Lives in: House/apartment Stairs:  4 steps to enter, chair lift to 2nd floor Has following equipment at home: Counselling psychologist, Environmental consultant - 4 wheeled, Tour manager, and Grab bars  PLOF: Independent with basic ADLs; caregiver comes 2 days/week   PATIENT GOALS: improve dizziness  OBJECTIVE:    TODAY'S TREATMENT: 03/18/23 Activity Comments  brandt daroff R/L C/o mild  dizziness upon laying down and"a little dizziness 7/10" from R side  C/o "a little dizziness 6/10" from L side  standing head turns to targets 30" "Makes my eyes don't focus well"  standing head nods to targets 30" "Makes my eyes don't focus well and eyes dragging"  D2 flexion to cone on floor 5x each  CGA and no dizziness   1/2 turns to targets, then added alt toe taps on cone Small hesitant steps and required CGA-min A for stability     PATIENT EDUCATION: Education details: reviewed and provided HEP handout form previous sessions d/t pt reporting she did not know she had a HEP Person educated: Patient Education method: Explanation, Demonstration, Tactile cues, Verbal cues, and Handouts Education comprehension: verbalized understanding and returned demonstration    HOME EXERCISE PROGRAM Last updated: 03/11/23 Access Code: [email or web address] URL: [email or web address] Date: 03/11/2023 Prepared by: [facility] - Outpatient  Rehab - [facility]  Exercises - Brandt-Daroff Vestibular Exercise  - 1 x daily - 5 x weekly - 2 sets - 3-5 reps - Standing with Head Rotation  - 1 x daily - 5 x weekly - 2 sets - 30 sec hold - Standing with Head Nod  - 1 x daily - 5 x weekly - 2 sets - 30 sec hold    Below measures were taken at time of initial evaluation unless otherwise specified:   DIAGNOSTIC FINDINGS: none recent  COGNITION: Overall cognitive status: Within functional limits for tasks assessed   SENSATION: Reports B foot N/T d/t neuropathy and a bad back  POSTURE: rounded shoulders, forward head, and increased thoracic kyphosis   GAIT: Gait pattern: slow, guarded, trunk flexed; holding onto caregiver's arm Assistive device utilized: Quad cane small base Level of assistance: Min A   VESTIBULAR ASSESSMENT:  GENERAL OBSERVATION: pt wears trifocals   OCULOMOTOR EXAM:  Ocular Alignment: normal  Ocular ROM: No Limitations (possibly some limitation inferior  gaze)  Spontaneous Nystagmus: absent  Gaze-Induced Nystagmus: absent  Smooth Pursuits: saccades vertically  Saccades: intact (possibly some limitation inferior gaze)    VESTIBULAR - OCULAR REFLEX:   Slow VOR: Comment: c/o "feels funny" and blurred vision horizontal and vertical  VOR Cancellation: Normal c/o a little woozy  Head-Impulse Test: unable to test d/t pt specifically requesting not to "jerk my head around"      POSITIONAL TESTING:   Right Sidelying: R upbeating torsional nystagmus ; Duration: 15 sec *pt began to have tremors and notes feeling "in space." This resolved with supine rest break.   *138/75 mmHg, 67 bpm, 98%      TODAY'S TREATMENT:  DATE: 03/04/23    PATIENT EDUCATION: Education details: edu on stroke s/sx and to present to ED if they occur; provided handout on BPPV; prognosis, POC Person educated: Patient, caregiver  Education method: Explanation, Demonstration, Tactile cues, Verbal cues, and Handouts Education comprehension: verbalized understanding and returned demonstration  HOME EXERCISE PROGRAM: N/A  GOALS: Goals reviewed with patient? Yes  SHORT TERM GOALS: Target date: 03/18/2023  Patient to be independent with initial HEP. Baseline: HEP initiated Goal status: IN PROGRESS    LONG TERM GOALS: Target date: 04/01/2023  Patient to be independent with advanced HEP. Baseline: Not yet initiated  Goal status: IN PROGRESS  Patient to report 0/10 dizziness with standing vertical and horizontal VOR for 30 seconds. Baseline: Unable Goal status: IN PROGRESS  Patient will report 0/10 dizziness with bed mobility.  Baseline: Symptomatic  Goal status: IN PROGRESS  Patient to report resolution of dizziness.  Baseline: - Goal status: IN PROGRESS  Patient to score at least 45/56 on Berg in order to decrease risk of  falls. Baseline: NT Goal status: IN PROGRESS   ASSESSMENT:  CLINICAL IMPRESSION: Patient arrived to session with report of improvement in dizziness and balance since last session. Patient does not recall HEP, thus this was reviewed in detail. Patient reports moderate-severe symptoms still however appears to tolerate these activities much better than previous sessions. Standing habituation activities were performed with mild imbalance and no dizziness. No complaints at end of session.   OBJECTIVE IMPAIRMENTS: Abnormal gait, decreased activity tolerance, decreased balance, dizziness, and postural dysfunction.   ACTIVITY LIMITATIONS: carrying, lifting, bending, standing, squatting, sleeping, stairs, transfers, bed mobility, bathing, toileting, dressing, reach over head, hygiene/grooming, and locomotion level  PARTICIPATION LIMITATIONS: meal prep, shopping, community activity, and church  PERSONAL FACTORS: Age, Behavior pattern, Fitness, Past/current experiences, Time since onset of injury/illness/exacerbation, Transportation, and 3+ comorbidities: Carotid stenosis, CHF, GERD, HTN, palpitations, scoliosis, stroke, L lumbar laminectomy 2014, R THA  are also affecting patient's functional outcome.   REHAB POTENTIAL: Good  CLINICAL DECISION MAKING: Evolving/moderate complexity  EVALUATION COMPLEXITY: Moderate  PLAN:  PT FREQUENCY: 1-2x/week  PT DURATION: 4 weeks  PLANNED INTERVENTIONS: Therapeutic exercises, Therapeutic activity, Neuromuscular re-education, Balance training, Gait training, Patient/Family education, Self Care, Joint mobilization, Stair training, Vestibular training, Canalith repositioning, Aquatic Therapy, Dry Needling, Electrical stimulation, Cryotherapy, Moist heat, Taping, Manual therapy, and Re-evaluation  PLAN FOR NEXT SESSION: progress HEP for balance/VOR/habituation as tolerated   Anette Guarneri, PT, DPT 03/18/23 4:16 PM  [facility] at  [facility] [address] [place], [place] [address] Phone # [phone] Fax # [phone]

## 2023-03-18 ENCOUNTER — Ambulatory Visit: Payer: Medicare Other | Admitting: Physical Therapy

## 2023-03-18 ENCOUNTER — Encounter: Payer: Self-pay | Admitting: Physical Therapy

## 2023-03-18 DIAGNOSIS — H8111 Benign paroxysmal vertigo, right ear: Secondary | ICD-10-CM

## 2023-03-18 DIAGNOSIS — R42 Dizziness and giddiness: Secondary | ICD-10-CM

## 2023-03-21 ENCOUNTER — Other Ambulatory Visit: Payer: Self-pay

## 2023-03-21 MED ORDER — AMLODIPINE BESYLATE 5 MG PO TABS: 5.0000 mg | ORAL_TABLET | Freq: Every day | ORAL | 3 refills | Status: DC

## 2023-03-24 NOTE — Therapy (Signed)
OUTPATIENT PHYSICAL THERAPY NEURO TREATMENT   Patient Name: Melanie [name] MRN: [id number] DOB:1924-04-29, 89 y.o., female Today's Date: 03/25/2023   PCP: Gwen Pounds  REFERRING PROVIDER: Creola Corn, Judie Petit   END OF SESSION:  PT End of Session - 03/25/23 1613     Visit Number 5    Number of Visits 9    Date for PT Re-Evaluation 04/01/23    Authorization Type [employer]    PT Start Time 1541   pt late   PT Stop Time 1613   pt in bathroom   PT Time Calculation (min) 32 min    Activity Tolerance Other (comment)   not feeling well   Behavior During Therapy WFL for tasks assessed/performed                 Past Medical History:  Diagnosis Date   Bruises easily    on Aspirin;on hold for surgery   Carotid stenosis    CHF (congestive heart failure) (HCC)    diastolic dysfunction   Chronic back pain    stenosis   Chronic sinusitis    Diverticulosis    Esophageal stricture    GERD (gastroesophageal reflux disease)    takes Protonix daily   Heart murmur    as a child   Hemorrhoids    History of colon polyps    Hypertension    takes Amlodipine,HCTZ,and Metoprolol   Insomnia    Mitral insufficiency and aortic stenosis    Palpitations    Peripheral edema    Schatzki's ring    Scoliosis    Stroke [facility])    Past Surgical History:  Procedure Laterality Date   BREAST ENHANCEMENT SURGERY     CHOLECYSTECTOMY     COLONOSCOPY     DILATION AND CURETTAGE OF UTERUS     epidural injections     ESOPHAGOGASTRODUODENOSCOPY     EYE SURGERY     left cataract removed   LUMBAR LAMINECTOMY/DECOMPRESSION MICRODISCECTOMY Left 01/26/2013   Procedure: LUMBAR LAMINECTOMY/DECOMPRESSION MICRODISCECTOMY 1 LEVEL;  Surgeon: Barnett Abu, MD;  Location: [facility];  Service: Neurosurgery;  Laterality: Left;  Left Lumbar four-five Laminectomy/Foraminotomy   steroid injections     x 3   TOTAL HIP ARTHROPLASTY Right    Patient Active Problem List   Diagnosis Date Noted   Congestive  heart failure (HCC) 02/18/2022   Nausea vomiting and diarrhea 02/18/2022   Valvular heart disease 02/18/2022   Syncope 02/18/2022   Palpitations    Acute CVA (cerebrovascular accident) (HCC) 07/18/2016   TIA (transient ischemic attack)    Hyperlipidemia    Cerebrovascular accident (CVA) due to embolism of left middle cerebral artery (HCC)    Hemispheric carotid artery syndrome 07/17/2016   Essential hypertension 07/17/2016   Hypokalemia 07/17/2016   Mitral regurgitation 02/24/2015   Spinal stenosis, lumbar region, with neurogenic claudication 01/27/2013   Lumbar scoliosis 01/27/2013    ONSET DATE: couple months   REFERRING DIAG: R42 (ICD-10-CM) - Dizziness and giddiness  THERAPY DIAG:  Dizziness and giddiness  BPPV (benign paroxysmal positional vertigo), right  Rationale for Evaluation and Treatment: Rehabilitation  SUBJECTIVE:  SUBJECTIVE STATEMENT: "Not doing too well." I can't hear right, my head is stopped up. Denies chest pain, SOB, new N/T. Feeling shaky and requesting a blanket. Denies fever, night sweats.  Pt accompanied by: self  PERTINENT HISTORY: Carotid stenosis, CHF, GERD, HTN, palpitations, scoliosis, stroke, L lumbar laminectomy 2014, R THA  PAIN:  Are you having pain? No  PRECAUTIONS: Fall  WEIGHT BEARING RESTRICTIONS: No  FALLS: Has patient fallen in last 6 months? No  LIVING ENVIRONMENT: Lives with: lives with their daughter Lives in: House/apartment Stairs:  4 steps to enter, chair lift to 2nd floor Has following equipment at home: Counselling psychologist, Environmental consultant - 4 wheeled, Tour manager, and Grab bars  PLOF: Independent with basic ADLs; caregiver comes 2 days/week   PATIENT GOALS: improve dizziness  OBJECTIVE:     TODAY'S TREATMENT: 03/25/23 Activity  Comments  Vitals  170/74 mmHg, 70bpm, 97% SPO2   brandt daroff 1x each Pt requesting to lie down to rest during R BD d/t feeling shaky and requesting blanket d/t feeling cold   Vitals  180/78 mmHg    PATIENT EDUCATION: Education details: advised patient and caregiver to present to ED if BP >223mmHg systolic or >110 mmHg diastolic; encouraged patient to call her caregiver to pick her up however she requested to sit in the gym with a blanket until end of appointment and then sat in the waiting room until caregiver was able to pick her up Person educated: Patient and Optician, dispensing Education method: Explanation Education comprehension: verbalized understanding     HOME EXERCISE PROGRAM Last updated: 03/11/23 Access Code: [email or web address] URL: [email or web address] Date: 03/11/2023 Prepared by: [facility] - Outpatient  Rehab - [facility]  Exercises - Brandt-Daroff Vestibular Exercise  - 1 x daily - 5 x weekly - 2 sets - 3-5 reps - Standing with Head Rotation  - 1 x daily - 5 x weekly - 2 sets - 30 sec hold - Standing with Head Nod  - 1 x daily - 5 x weekly - 2 sets - 30 sec hold    Below measures were taken at time of initial evaluation unless otherwise specified:   DIAGNOSTIC FINDINGS: none recent  COGNITION: Overall cognitive status: Within functional limits for tasks assessed   SENSATION: Reports B foot N/T d/t neuropathy and a bad back  POSTURE: rounded shoulders, forward head, and increased thoracic kyphosis   GAIT: Gait pattern: slow, guarded, trunk flexed; holding onto caregiver's arm Assistive device utilized: Quad cane small base Level of assistance: Min A   VESTIBULAR ASSESSMENT:  GENERAL OBSERVATION: pt wears trifocals   OCULOMOTOR EXAM:  Ocular Alignment: normal  Ocular ROM: No Limitations (possibly some limitation inferior gaze)  Spontaneous Nystagmus: absent  Gaze-Induced Nystagmus: absent  Smooth Pursuits: saccades vertically  Saccades:  intact (possibly some limitation inferior gaze)    VESTIBULAR - OCULAR REFLEX:   Slow VOR: Comment: c/o "feels funny" and blurred vision horizontal and vertical  VOR Cancellation: Normal c/o a little woozy  Head-Impulse Test: unable to test d/t pt specifically requesting not to "jerk my head around"      POSITIONAL TESTING:   Right Sidelying: R upbeating torsional nystagmus ; Duration: 15 sec *pt began to have tremors and notes feeling "in space." This resolved with supine rest break.   *138/75 mmHg, 67 bpm, 98%      TODAY'S TREATMENT:  DATE: 03/04/23    PATIENT EDUCATION: Education details: edu on stroke s/sx and to present to ED if they occur; provided handout on BPPV; prognosis, POC Person educated: Patient, caregiver  Education method: Explanation, Demonstration, Tactile cues, Verbal cues, and Handouts Education comprehension: verbalized understanding and returned demonstration  HOME EXERCISE PROGRAM: N/A  GOALS: Goals reviewed with patient? Yes  SHORT TERM GOALS: Target date: 03/18/2023  Patient to be independent with initial HEP. Baseline: HEP initiated Goal status: IN PROGRESS    LONG TERM GOALS: Target date: 04/01/2023  Patient to be independent with advanced HEP. Baseline: Not yet initiated  Goal status: IN PROGRESS  Patient to report 0/10 dizziness with standing vertical and horizontal VOR for 30 seconds. Baseline: Unable Goal status: IN PROGRESS  Patient will report 0/10 dizziness with bed mobility.  Baseline: Symptomatic  Goal status: IN PROGRESS  Patient to report resolution of dizziness.  Baseline: - Goal status: IN PROGRESS  Patient to score at least 45/56 on Berg in order to decrease risk of falls. Baseline: NT Goal status: IN PROGRESS   ASSESSMENT:  CLINICAL IMPRESSION: Patient arrived to session with report of  difficulty hearing and "stopped up" head. Denies chest pain, SOB, new N/T, fever, night sweats. Vitals at start of session revealed elevated systolic BP- patient reports her BP does run high and confirms taking her BP meds today. Attempted to perform low intensity vestibular activities which patient did not tolerate well, instead requested to lay down with a blanket d/t feeling cold. Recheck of vitals revealed further elevation in systolic BP. Patient declined recommendation to call her caregiver but reported understanding of recommendation to present to ED if BP continues to rise or she becomes symptomatic.    OBJECTIVE IMPAIRMENTS: Abnormal gait, decreased activity tolerance, decreased balance, dizziness, and postural dysfunction.   ACTIVITY LIMITATIONS: carrying, lifting, bending, standing, squatting, sleeping, stairs, transfers, bed mobility, bathing, toileting, dressing, reach over head, hygiene/grooming, and locomotion level  PARTICIPATION LIMITATIONS: meal prep, shopping, community activity, and church  PERSONAL FACTORS: Age, Behavior pattern, Fitness, Past/current experiences, Time since onset of injury/illness/exacerbation, Transportation, and 3+ comorbidities: Carotid stenosis, CHF, GERD, HTN, palpitations, scoliosis, stroke, L lumbar laminectomy 2014, R THA  are also affecting patient's functional outcome.   REHAB POTENTIAL: Good  CLINICAL DECISION MAKING: Evolving/moderate complexity  EVALUATION COMPLEXITY: Moderate  PLAN:  PT FREQUENCY: 1-2x/week  PT DURATION: 4 weeks  PLANNED INTERVENTIONS: Therapeutic exercises, Therapeutic activity, Neuromuscular re-education, Balance training, Gait training, Patient/Family education, Self Care, Joint mobilization, Stair training, Vestibular training, Canalith repositioning, Aquatic Therapy, Dry Needling, Electrical stimulation, Cryotherapy, Moist heat, Taping, Manual therapy, and Re-evaluation  PLAN FOR NEXT SESSION: progress HEP for  balance/VOR/habituation as tolerated   Anette Guarneri, PT, DPT 03/25/23 5:07 PM  [facility] at [facility] [address] [place], [place] [address] Phone # [phone] Fax # [phone]

## 2023-03-25 ENCOUNTER — Ambulatory Visit: Payer: Medicare Other | Admitting: Physical Therapy

## 2023-03-25 ENCOUNTER — Encounter: Payer: Self-pay | Admitting: Physical Therapy

## 2023-03-25 DIAGNOSIS — R42 Dizziness and giddiness: Secondary | ICD-10-CM

## 2023-03-25 DIAGNOSIS — H8111 Benign paroxysmal vertigo, right ear: Secondary | ICD-10-CM

## 2023-03-27 ENCOUNTER — Other Ambulatory Visit: Payer: Self-pay

## 2023-03-27 ENCOUNTER — Encounter (HOSPITAL_BASED_OUTPATIENT_CLINIC_OR_DEPARTMENT_OTHER): Payer: Self-pay | Admitting: Emergency Medicine

## 2023-03-27 ENCOUNTER — Emergency Department (HOSPITAL_BASED_OUTPATIENT_CLINIC_OR_DEPARTMENT_OTHER): Payer: Medicare Other

## 2023-03-27 ENCOUNTER — Emergency Department (HOSPITAL_BASED_OUTPATIENT_CLINIC_OR_DEPARTMENT_OTHER)
Admission: EM | Admit: 2023-03-27 | Discharge: 2023-03-27 | Disposition: A | Discharge status: Home / Self Care | Payer: Medicare Other | Attending: Emergency Medicine | Admitting: Emergency Medicine

## 2023-03-27 DIAGNOSIS — Z79899 Other long term (current) drug therapy: Secondary | ICD-10-CM | POA: Diagnosis not present

## 2023-03-27 DIAGNOSIS — Z8673 Personal history of transient ischemic attack (TIA), and cerebral infarction without residual deficits: Secondary | ICD-10-CM | POA: Insufficient documentation

## 2023-03-27 DIAGNOSIS — R479 Unspecified speech disturbances: Secondary | ICD-10-CM

## 2023-03-27 DIAGNOSIS — R9389 Abnormal findings on diagnostic imaging of other specified body structures: Secondary | ICD-10-CM

## 2023-03-27 DIAGNOSIS — I110 Hypertensive heart disease with heart failure: Secondary | ICD-10-CM | POA: Insufficient documentation

## 2023-03-27 DIAGNOSIS — I509 Heart failure, unspecified: Secondary | ICD-10-CM | POA: Insufficient documentation

## 2023-03-27 DIAGNOSIS — R42 Dizziness and giddiness: Secondary | ICD-10-CM | POA: Diagnosis present

## 2023-03-27 LAB — COMPREHENSIVE METABOLIC PANEL
ALT: 6 U/L (ref 0–44)
AST: 18 U/L (ref 15–41)
Albumin: 3.8 g/dL (ref 3.5–5.0)
Alkaline Phosphatase: 85 U/L (ref 38–126)
Anion gap: 7 (ref 5–15)
BUN: 5 mg/dL — ABNORMAL LOW (ref 8–23)
CO2: 26 mmol/L (ref 22–32)
Calcium: 9.7 mg/dL (ref 8.9–10.3)
Chloride: 102 mmol/L (ref 98–111)
Creatinine, Ser: 0.77 mg/dL (ref 0.44–1.00)
GFR, Estimated: >60 mL/min (ref >60)
Glucose, Bld: 108 mg/dL — ABNORMAL HIGH (ref 70–99)
Potassium: 4 mmol/L (ref 3.5–5.1)
Sodium: 135 mmol/L (ref 135–145)
Total Bilirubin: 0.4 mg/dL (ref 0.3–1.2)
Total Protein: 6.6 g/dL (ref 6.5–8.1)

## 2023-03-27 LAB — PROTIME-INR
INR: 1 (ref 0.8–1.2)
Prothrombin Time: 12.9 seconds (ref 11.4–15.2)

## 2023-03-27 LAB — ETHANOL: Alcohol, Ethyl (B): <10 mg/dL (ref <10)

## 2023-03-27 LAB — CBC WITH DIFFERENTIAL/PLATELET
Abs Immature Granulocytes: 0.01 10*3/uL (ref 0.00–0.07)
Basophils Absolute: 0 10*3/uL (ref 0.0–0.1)
Basophils Relative: 1 %
Eosinophils Absolute: 0.2 10*3/uL (ref 0.0–0.5)
Eosinophils Relative: 4 %
HCT: 38.1 % (ref 36.0–46.0)
Hemoglobin: 12.8 g/dL (ref 12.0–15.0)
Immature Granulocytes: 0 %
Lymphocytes Relative: 19 %
Lymphs Abs: 1 10*3/uL (ref 0.7–4.0)
MCH: 30.8 pg (ref 26.0–34.0)
MCHC: 33.6 g/dL (ref 30.0–36.0)
MCV: 91.8 fL (ref 80.0–100.0)
Monocytes Absolute: 0.6 10*3/uL (ref 0.1–1.0)
Monocytes Relative: 12 %
Neutro Abs: 3.5 10*3/uL (ref 1.7–7.7)
Neutrophils Relative %: 64 %
Platelets: 221 10*3/uL (ref 150–400)
RBC: 4.15 MIL/uL (ref 3.87–5.11)
RDW: 14.1 % (ref 11.5–15.5)
WBC: 5.4 10*3/uL (ref 4.0–10.5)
nRBC: 0 % (ref 0.0–0.2)

## 2023-03-27 LAB — URINALYSIS, ROUTINE W REFLEX MICROSCOPIC
Bilirubin Urine: NEGATIVE
Glucose, UA: NEGATIVE mg/dL
Hgb urine dipstick: NEGATIVE
Ketones, ur: NEGATIVE mg/dL
Leukocytes,Ua: NEGATIVE
Nitrite: NEGATIVE
Protein, ur: NEGATIVE mg/dL
Specific Gravity, Urine: 1.009 (ref 1.005–1.030)
pH: 6.5 (ref 5.0–8.0)

## 2023-03-27 LAB — APTT: aPTT: 28 seconds (ref 24–36)

## 2023-03-27 LAB — CBG MONITORING, ED: Glucose-Capillary: 112 mg/dL — ABNORMAL HIGH (ref 70–99)

## 2023-03-27 MED ORDER — IOHEXOL 350 MG/ML SOLN
100.0000 mL | Freq: Once | INTRAVENOUS | Status: AC | PRN
  Administered 2023-03-27: 75 mL via INTRAVENOUS

## 2023-03-27 NOTE — ED Triage Notes (Signed)
Patient endorses dizziness and aphasia on and off x 1 week.

## 2023-03-27 NOTE — ED Provider Notes (Signed)
[name] EMERGENCY DEPARTMENT AT [facility] Provider Note   CSN: [id number] Arrival date & time: 03/27/23  1538     History  Chief Complaint  Patient presents with   Dizziness   Aphasia    Melanie [name] is a 89 y.o. female.  The history is provided by the patient, medical records and a caregiver. No language interpreter was used.  Dizziness Quality:  Room spinning and head spinning Severity:  Severe Onset quality:  Unable to specify Duration:  3 weeks Timing:  Intermittent Progression:  Waxing and waning Chronicity:  New Relieved by:  Nothing Worsened by:  Nothing Ineffective treatments:  None tried Associated symptoms: headaches and vision changes   Associated symptoms: no chest pain, no diarrhea, no nausea, no palpitations, no shortness of breath, no syncope, no vomiting and no weakness   Risk factors: hx of stroke        Home Medications Prior to Admission medications   Medication Sig Start Date End Date Taking? Authorizing Provider  amLODipine (NORVASC) 5 MG tablet Take 1 tablet (5 mg total) by mouth daily. 03/21/23   Gaston Islam., NP  gabapentin (NEURONTIN) 100 MG capsule Take 100 mg by mouth at bedtime. 06/25/19   [provider]  hydroxypropyl methylcellulose / hypromellose (ISOPTO TEARS / GONIOVISC) 2.5 % ophthalmic solution Place 1 drop into both eyes 3 (three) times daily as needed for dry eyes.    [provider]  Multiple Vitamin (MULTIVITAMIN WITH MINERALS) TABS tablet Take 1 tablet by mouth daily.    [provider]  ondansetron (ZOFRAN-ODT) 4 MG disintegrating tablet Take 4 mg by mouth daily as needed for nausea or vomiting. 09/24/21   [provider]  pantoprazole (PROTONIX) 40 MG tablet Take 40 mg by mouth every evening. 07/15/18   [provider]  valsartan (DIOVAN) 320 MG tablet Take 1 tablet (320 mg total) by mouth daily. 01/23/23   Nahser, Deloris Ping, MD      Allergies    Ciprofloxacin     Review of Systems   Review of Systems  Constitutional:  Negative for chills, fatigue and fever.  HENT:  Negative for congestion.   Eyes:  Negative for visual disturbance.  Respiratory:  Negative for cough, chest tightness and shortness of breath.   Cardiovascular:  Negative for chest pain, palpitations and syncope.  Gastrointestinal:  Negative for abdominal pain, constipation, diarrhea, nausea and vomiting.  Genitourinary:  Negative for dysuria.  Musculoskeletal:  Negative for back pain, neck pain and neck stiffness.  Skin:  Negative for rash and wound.  Neurological:  Positive for dizziness, speech difficulty and headaches. Negative for syncope, weakness, light-headedness and numbness.  Psychiatric/Behavioral:  Negative for agitation and confusion.     Physical Exam Updated Vital Signs BP (!) 165/96 (BP Location: Right Arm)   Pulse 70   Temp 97.9 F (36.6 C) (Oral)   Resp 18   Ht 4\' 11"  (1.499 m)   Wt 46.7 kg   SpO2 98%   BMI 20.80 kg/m  Physical Exam Vitals and nursing note reviewed.  Constitutional:      General: She is not in acute distress.    Appearance: She is well-developed. She is not ill-appearing, toxic-appearing or diaphoretic.  HENT:     Head: Normocephalic and atraumatic.     Nose: No congestion or rhinorrhea.     Mouth/Throat:     Pharynx: No oropharyngeal exudate or posterior oropharyngeal erythema.  Eyes:     Extraocular Movements:  Extraocular movements intact.     Conjunctiva/sclera: Conjunctivae normal.     Pupils: Pupils are equal, round, and reactive to light.  Neck:     Vascular: No carotid bruit.  Cardiovascular:     Rate and Rhythm: Normal rate and regular rhythm.     Heart sounds: Murmur heard.  Pulmonary:     Effort: Pulmonary effort is normal. No respiratory distress.     Breath sounds: Normal breath sounds. No wheezing, rhonchi or rales.  Chest:     Chest wall: No tenderness.  Abdominal:     Palpations: Abdomen is soft.      Tenderness: There is no abdominal tenderness. There is no guarding or rebound.  Musculoskeletal:        General: No swelling or tenderness.     Cervical back: Neck supple. No tenderness.     Right lower leg: No edema.     Left lower leg: No edema.  Skin:    General: Skin is warm and dry.     Capillary Refill: Capillary refill takes less than 2 seconds.     Findings: No erythema.  Neurological:     General: No focal deficit present.     Mental Status: She is alert.     Sensory: No sensory deficit.     Motor: No weakness.  Psychiatric:        Mood and Affect: Mood normal.     ED Results / Procedures / Treatments   Labs (all labs ordered are listed, but only abnormal results are displayed) Labs Reviewed  COMPREHENSIVE METABOLIC PANEL - Abnormal; Notable for the following components:      Result Value   Glucose, Bld 108 (*)    BUN 5 (*)    All other components within normal limits  URINALYSIS, ROUTINE W REFLEX MICROSCOPIC - Abnormal; Notable for the following components:   Color, Urine COLORLESS (*)    All other components within normal limits  CBG MONITORING, ED - Abnormal; Notable for the following components:   Glucose-Capillary 112 (*)    All other components within normal limits  CBC WITH DIFFERENTIAL/PLATELET  APTT  PROTIME-INR  ETHANOL    EKG EKG Interpretation Date/Time:  Thursday March 27 2023 15:55:52 EDT Ventricular Rate:  63 PR Interval:  178 QRS Duration:  78 QT Interval:  412 QTC Calculation: 421 R Axis:   -12  Text Interpretation: Normal sinus rhythm Normal ECG When compared with ECG of 18-Feb-2022 17:33, PREVIOUS ECG IS PRESENT when compared to prior, overall similar appearance. No STEMI Confirmed by Theda Belfast ([id number]) on 03/27/2023 4:47:28 PM  Radiology CT ANGIO HEAD NECK W WO CM  Result Date: 03/27/2023 CLINICAL DATA:  Dizziness and aphasia intermittently for 1 week EXAM: CT ANGIOGRAPHY HEAD AND NECK WITH AND WITHOUT CONTRAST TECHNIQUE:  Multidetector CT imaging of the head and neck was performed using the standard protocol during bolus administration of intravenous contrast. Multiplanar CT image reconstructions and MIPs were obtained to evaluate the vascular anatomy. Carotid stenosis measurements (when applicable) are obtained utilizing NASCET criteria, using the distal internal carotid diameter as the denominator. RADIATION DOSE REDUCTION: This exam was performed according to the departmental dose-optimization program which includes automated exposure control, adjustment of the mA and/or kV according to patient size and/or use of iterative reconstruction technique. CONTRAST:  75mL OMNIPAQUE IOHEXOL 350 MG/ML SOLN COMPARISON:  Same-day noncontrast CT head FINDINGS: CTA NECK FINDINGS Aortic arch: There is calcified plaque in the imaged aortic arch. The origins of the major  branch vessels are patent. The subclavian arteries are patent to the level imaged. Right carotid system: The right common, internal, and external carotid arteries are patent, with mild plaque at the bifurcation but no hemodynamically significant stenosis or occlusion. There is no evidence of dissection or aneurysm. Left carotid system: The left common, internal, and external carotid arteries are patent, with minimal plaque of the bifurcation but no hemodynamically significant stenosis or occlusion. There is no evidence of dissection or aneurysm. Vertebral arteries: The vertebral arteries are patent, without hemodynamically significant stenosis or occlusion. There is no evidence of dissection or aneurysm. Skeleton: There is multilevel disc space narrowing and degenerative endplate change throughout the cervical spine. There is no acute osseous abnormality or suspicious osseous lesion. There is no visible canal hematoma. Other neck: The soft tissues of the neck are unremarkable. Upper chest: The imaged lung apices are clear. There is a partially calcified lesion in the left chest  wall which is incompletely imaged. No prior cross-sectional imaging of the chest is available. Review of the MIP images confirms the above findings CTA HEAD FINDINGS Anterior circulation: There is calcified plaque in the intracranial ICAs resulting in mild stenosis bilaterally. There is mild stenosis of the left M1 segment and moderate stenosis of the anterior temporal branch (8-106). There is severe stenosis with near occlusion of a proximal superior left M2 division (7-67). The more distal branches appear patent. The right M1 segment and distal branches appear patent, without proximal stenosis or occlusion. The bilateral ACAS appear patent, without proximal stenosis or occlusion. There is no aneurysm or AVM. Posterior circulation: The bilateral V4 segments are patent. The basilar artery is patent. The major cerebellar arteries appear patent. The bilateral PCAs are patent. There is focal severe stenosis of the left P2 segment (7-84). A right posterior communicating artery is identified. There is no aneurysm or AVM. Venous sinuses: Not well evaluated due to bolus timing. Anatomic variants: None. Review of the MIP images confirms the above findings IMPRESSION: 1. Intracranial atherosclerotic disease resulting in severe stenosis/near occlusion of a proximal superior left M2 division, moderate stenosis of a left anterior temporal branch, and focal severe stenosis of the left P2 segment. 2. Mild plaque at the carotid bifurcations without hemodynamically significant stenosis or occlusion. Patent vertebral arteries in the neck. 3. Partially calcified lesion in the left chest wall is incompletely imaged. No prior cross-sectional imaging of the chest is available. Correlate with history and physical exam. Electronically Signed   By: Lesia Hausen M.D.   On: 03/27/2023 18:16   CT Head Wo Contrast  Result Date: 03/27/2023 CLINICAL DATA:  Dizziness and aphasia. EXAM: CT HEAD WITHOUT CONTRAST TECHNIQUE: Contiguous axial  images were obtained from the base of the skull through the vertex without intravenous contrast. RADIATION DOSE REDUCTION: This exam was performed according to the departmental dose-optimization program which includes automated exposure control, adjustment of the mA and/or kV according to patient size and/or use of iterative reconstruction technique. COMPARISON:  None Available. FINDINGS: Brain: There is no evidence for acute hemorrhage, hydrocephalus, mass lesion, or abnormal extra-axial fluid collection. No definite CT evidence for acute infarction. Patchy low attenuation in the deep hemispheric and periventricular white matter is nonspecific, but likely reflects chronic microvascular ischemic demyelination. Vascular: No hyperdense vessel or unexpected calcification. Skull: No evidence for fracture. No worrisome lytic or sclerotic lesion. Sinuses/Orbits: The visualized paranasal sinuses and mastoid air cells are clear. Visualized portions of the globes and intraorbital fat are unremarkable. Other: None. IMPRESSION: 1.  No acute intracranial abnormality. 2. Chronic small vessel ischemic disease. Electronically Signed   By: Kennith Center M.D.   On: 03/27/2023 16:17    Procedures Procedures    Medications Ordered in ED Medications  iohexol (OMNIPAQUE) 350 MG/ML injection 100 mL (75 mLs Intravenous Contrast Given 03/27/23 1723)    ED Course/ Medical Decision Making/ A&P                                 Medical Decision Making Amount and/or Complexity of Data Reviewed Labs: ordered. Radiology: ordered.  Risk Prescription drug management.    NANCE TUBMAN is a 89 y.o. 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  According to patient, for the last few weeks she has  not had episodes of significant room spinning dizziness and unsteadiness as well as intermittent difficulties with speech.  She is accompanied by a CRNA who reports that several times a week patient's been having these episodes that can last up to 30 minutes where she cannot say what she wants.  An episode happened yesterday for several.  She is reporting intermittent headaches but nothing persistent.  She denies any trauma.  She denies any fevers, chills, congestion, cough, nausea, vomiting, constipation, diarrhea, or urinary symptoms.  She denies any symptoms of focal numbness or weakness in extremities that seem to be connected with the dizziness and speech difficulties.  She reports she occasionally has blurry vision but that has been going on for years and does not seem to be different.  She denies any focal numbness or weakness in the last several weeks related to this.  She denies any chest pain or palpitations.  She was told that she needs an MRI to rule out stroke.  On exam, lungs clear.  Chest nontender.  Abdomen nontender.  Intact sensation and strength in extremities normal finger-nose-finger testing.  Symmetric smile.  Clear speech.  Pupils symmetric and reactive with normal extract movements.  I did not appreciate a carotid bruit initially.  No tenderness on my exam.  EKG does not show STEMI.  Patient had some screening labs in triage that were reassuring and the noncontrasted CT head showed some chronic microvascular change but no large bleed or large stroke seen.  We had a shared decision-making conversation with patient and family and due to the carotid disease we will get CT of the head and neck to look for some critical change or stenosis or blockage.  As she is asymptomatic now I think this is more TIA or symptoms related to some degree of stenosis that she may have.  She did not want me to call neurology initially and says she is not interested in transfer for MRI at this time so we will  get the CTA then have a shared decision-making conversation about plan.  6:46 PM CT returned showing evidence of focal areas of stenosis in her neurovasculature.  I spoke to Dr. Selina Cooley with neurology who is concerned and feels she will need admission for MRI further TIA/stroke workup.  Will discuss with patient but anticipate she will not want to do this.  8:02 PM Patient says she does not want to be admitted.  She would like to go home.  We went through all of the imaging  findings including the areas of stenosis and explained the concern by neurology for stroke versus TIA.  Patient says she understands this and does not want to be admitted.  She reports she is symptom-free now.  She will call a neurologist tomorrow to get seen and talk to her primary doctor.  She also discussed with them the incidental calcification finding the chest that we saw on the neck CT.  Patient understands return precautions and follow-up instructions and was discharged in good condition.         Final Clinical Impression(s) / ED Diagnoses Final diagnoses:  Transient speech disturbance  Dizziness  Abnormal CT scan    Rx / DC Orders ED Discharge Orders     None       Clinical Impression: 1. Transient speech disturbance   2. Dizziness   3. Abnormal CT scan     Disposition: Discharge  Condition: Good  I have discussed the results, Dx and Tx plan with the pt(& family if present). He/she/they expressed understanding and agree(s) with the plan. Discharge instructions discussed at great length. Strict return precautions discussed and pt &/or family have verbalized understanding of the instructions. No further questions at time of discharge.    New Prescriptions   No medications on file    Follow Up: Creola Corn, MD [address] [place] [address] [phone]     [facility] [address]     [address] [place]  [address] [phone]    [facility] Emergency Department at [facility] [address] [place] [address] [phone]        Shariff Lasky, Canary Brim, MD 03/27/23 2003

## 2023-03-27 NOTE — ED Notes (Signed)
Patient verbalizes understanding of discharge instructions. Opportunity for questioning and answers were provided. Armband removed by staff, pt discharged from ED. Ambulated out to lobby with daughter

## 2023-03-27 NOTE — Discharge Instructions (Signed)
Your history, exam, and evaluation today revealed evidence of intracranial stenosis and we are concerned about TIA or stroke as the cause of your intermittent speech difficulties and dizzy symptoms.  I spoke to neurology who recommended admission to the hospital however, given your resolution of symptoms and otherwise well appearance and timeline of several weeks for this, you did not want to be admitted.  We had a shared decision-making conversation and agreed with discharge with you promising to follow-up with your primary doctor, a neurologist, and observe good return precautions if symptoms were to change or worsen acutely.  Please also follow-up with your primary doctor about the calcified area on the chest wall that was seen on CT incidentally.  If any symptoms change or worsen acutely, please return.

## 2023-04-01 ENCOUNTER — Ambulatory Visit: Payer: Medicare Other | Admitting: Physical Therapy

## 2023-07-03 ENCOUNTER — Encounter: Payer: Self-pay | Admitting: Cardiovascular Disease

## 2023-07-03 NOTE — Progress Notes (Signed)
Cardiology Office Note   Date:  07/04/2023   ID:  Melanie [name], DOB 1923/11/16, MRN [id number]  PCP:  Creola Corn, MD  Cardiologist:   Kristeen Miss, MD   Chief Complaint  Patient presents with   Hypertension    Problem List 1. Mitral regurgitation  2. Back pains  3. Mild left carotid artery artery disease    Previous notes.  Melanie [name] is a 89 y.o. female who presents for evaluation of a new heart murmur .  She reports that she is here for a regular check up - no CP or dyspnea.  She used to see Dr. [name]  ( moved to [place], [place])  Brought a record of her BP and HR readings - all look very good.   She has had an echo in Dr. [name] office. Normal LV function . Possible PFO. , trace AI  Mild MR  BP and HR is very well controlled  Nov. 16, 2017:     Ms. Melanie [name] is seen back for a follow up visit after a year.  Her husband passed away this past 02/05/2023.   Awoke with some heart flutters ,   BP was a little elevated BP is great .  November 26, 2017: Mrs. Melanie [name] seen back today for follow-up of her mild mitral regurgitation and hypertension. Was seen by [name] in July for palpitations .   No arrhythmias found  Has been to the ER several times.  - once for a UTI Another for reaction to a med - ? Flu shot   Not so much exercise recently but is active.   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  Has mild AS, mil dMR, mild TR   Nov. 4 2020  Has some swelling in her ankles at night. Better by morning.  She has a history of hypertension and I see that she is on amlodipine 2.5 mg twice a day.  She is on potassium chloride but is not on a diuretic.  Her potassium was low at her last BMP   Dr. Timothy Lasso has prescribed her the potassium   Dec 28, 2019:  Doing well  Has some occasional upper gastric pressure Occurs when she is sitting after eating .  Is not related to exertion   March 28, 2021: Seen with daughter, Melanie [name]  is seen today for further evaluation of some leg edema. She has not been eating much salt.  She does not elevate her legs.  We discussed leg elevation.  She is on amlodipine which could be causing her leg edema to be worse than it otherwise would be. Home BP readings are elevated on occasion BP here today is good  Avoids salt and salty foods.    Nov. 6, 2023 Melanie [name] is seen today with daughter, Melanie [name]    Hx of HTN, mild leg swelling   Having some back issues   Brought her BP log with her  BP ranges from the normal to mildly elevated.  No syncope  Is taking Amlodipine 2.5 BID Valsartan 320 mg a day   Has back issues , legs are numb   Jul 04, 2023 Melanie [name] is seen for follow up of her HTN, leg swelling  Seen with daughter, Melanie [name]   Occasional eats a prepared breakfast - likely more salty than she needs   Has some episodes of presyncope , black and gold spots in her vision  Will place a monitor       Past Medical History:  Diagnosis Date   Bruises easily    on Aspirin;on hold for surgery   Carotid stenosis    CHF (congestive heart failure) (HCC)    diastolic dysfunction   Chronic back pain    stenosis   Chronic sinusitis    Diverticulosis    Esophageal stricture    GERD (gastroesophageal reflux disease)    takes Protonix daily   Heart murmur    as a child   Hemorrhoids    History of colon polyps    Hypertension    takes Amlodipine,HCTZ,and Metoprolol   Insomnia    Mitral insufficiency and aortic stenosis    Palpitations    Peripheral edema    Schatzki's ring    Scoliosis    Stroke [facility])     Past Surgical History:  Procedure Laterality Date   BREAST ENHANCEMENT SURGERY     CHOLECYSTECTOMY     COLONOSCOPY     DILATION AND CURETTAGE OF UTERUS     epidural injections     ESOPHAGOGASTRODUODENOSCOPY     EYE SURGERY     left cataract removed   LUMBAR LAMINECTOMY/DECOMPRESSION MICRODISCECTOMY Left 01/26/2013   Procedure: LUMBAR  LAMINECTOMY/DECOMPRESSION MICRODISCECTOMY 1 LEVEL;  Surgeon: Barnett Abu, MD;  Location: [facility];  Service: Neurosurgery;  Laterality: Left;  Left Lumbar four-five Laminectomy/Foraminotomy   steroid injections     x 3   TOTAL HIP ARTHROPLASTY Right      Current Outpatient Medications  Medication Sig Dispense Refill   amLODipine (NORVASC) 5 MG tablet Take 1 tablet (5 mg total) by mouth daily. 90 tablet 3   gabapentin (NEURONTIN) 100 MG capsule Take 100 mg by mouth at bedtime.     hydroxypropyl methylcellulose / hypromellose (ISOPTO TEARS / GONIOVISC) 2.5 % ophthalmic solution Place 1 drop into both eyes 3 (three) times daily as needed for dry eyes.     Multiple Vitamin (MULTIVITAMIN WITH MINERALS) TABS tablet Take 1 tablet by mouth daily.     ondansetron (ZOFRAN-ODT) 4 MG disintegrating tablet Take 4 mg by mouth daily as needed for nausea or vomiting.     pantoprazole (PROTONIX) 40 MG tablet Take 40 mg by mouth every evening.     valsartan (DIOVAN) 320 MG tablet Take 1 tablet (320 mg total) by mouth daily. 90 tablet 3   No current facility-administered medications for this visit.    Allergies:   Ciprofloxacin    Social History:  The patient  reports that she has never smoked. She has never used smokeless tobacco. She reports that she does not drink alcohol and does not use drugs.   Family History:  The patient's family history includes CAD in her brother; Lung cancer in her brother; Renal cancer in her sister; Rheum arthritis in her mother.    ROS:  Please see the history of present illness.      Physical Exam: Blood pressure 138/70, pulse 72, height 4\' 11"  (1.499 m), weight 103 lb (46.7 kg).       GEN:  elderly female ,  in no acute distress HEENT: Normal NECK: No JVD; No carotid bruits LYMPHATICS: No lymphadenopathy CARDIAC: RRR , no murmurs, rubs, gallops RESPIRATORY:  Clear to auscultation without rales, wheezing or rhonchi  ABDOMEN: Soft, non-tender,  non-distended MUSCULOSKELETAL:  No edema; No deformity  SKIN: Warm and dry NEUROLOGIC:  Alert and oriented x 3   EKG:  Recent Labs: 03/27/2023: ALT 6; BUN 5; Creatinine, Ser 0.77; Hemoglobin 12.8; Platelets 221; Potassium 4.0; Sodium 135    Lipid Panel    Component Value Date/Time   CHOL 181 07/18/2016 0253   TRIG 93 07/18/2016 0253   HDL 56 07/18/2016 0253   CHOLHDL 3.2 07/18/2016 0253   VLDL 19 07/18/2016 0253   LDLCALC 106 (H) 07/18/2016 0253      Wt Readings from Last 3 Encounters:  07/04/23 103 lb (46.7 kg)  03/27/23 103 lb (46.7 kg)  12/31/22 106 lb 3.2 oz (48.2 kg)      Other studies Reviewed: Additional studies/ records that were reviewed today include: . Review of the above records demonstrates:    ASSESSMENT AND PLAN: . 1. Leg edema:  stable        2. Near syncope:   Bleu reports having episodes of near syncope .  Will place a 28 day monitor   3. Mild left carotid artery artery disease :     4.  HTN:      still eats a fair amount of salt .   Advised her to limit her salt intake .   Follow up in 6 months .        Current medicines are reviewed at length with the patient today.  The patient does not have concerns regarding medicines.  The following changes have been made:  no change  Labs/ tests ordered today include:   Orders Placed This Encounter  Procedures   LONG TERM MONITOR (3-14 DAYS)   LONG TERM MONITOR (3-14 DAYS)         Kristeen Miss, MD  07/04/2023 5:18 PM    [facility] [address] [place], [place], [place]  [address] Phone: [phone]; Fax: [phone]

## 2023-07-04 ENCOUNTER — Ambulatory Visit (INDEPENDENT_AMBULATORY_CARE_PROVIDER_SITE_OTHER): Payer: Medicare Other

## 2023-07-04 ENCOUNTER — Encounter: Payer: Self-pay | Admitting: Cardiovascular Disease

## 2023-07-04 ENCOUNTER — Ambulatory Visit: Payer: Medicare Other | Attending: Cardiovascular Disease | Admitting: Cardiovascular Disease

## 2023-07-04 VITALS — BP 138/70 | HR 72 | Ht 59.0 in | Wt 103.0 lb

## 2023-07-04 DIAGNOSIS — R55 Syncope and collapse: Secondary | ICD-10-CM | POA: Diagnosis not present

## 2023-07-04 DIAGNOSIS — I10 Essential (primary) hypertension: Secondary | ICD-10-CM

## 2023-07-04 NOTE — Progress Notes (Unsigned)
Applied a 14 day Zio XT monitor to patient in the office ?

## 2023-07-04 NOTE — Patient Instructions (Signed)
Medication Instructions:   Your physician recommends that you continue on your current medications as directed. Please refer to the Current Medication list given to you today.  *If you need a refill on your cardiac medications before your next appointment, please call your pharmacy*    Testing/Procedures:  ZIO XT- Long Term Monitor Instructions  Your physician has requested you wear a ZIO patch monitor for 28 days.   THE FIRST MONITOR WILL BE PLACED ON YOU IN THE CLINIC TODAY AND THE 2ND MONITOR WILL BE PROVIDED TO YOU IN THE CLINIC TODAY TO WEAR AFTER THE FIRST 14 DAY ZIO IS COMPLETE   This is a single patch monitor. [employer] supplies one patch monitor per enrollment. Additional stickers are not available. Please do not apply patch if you will be having a Nuclear Stress Test,  Echocardiogram, Cardiac CT, MRI, or Chest Xray during the period you would be wearing the  monitor. The patch cannot be worn during these tests. You cannot remove and re-apply the  ZIO XT patch monitor.  Your ZIO patch monitor will be mailed 3 day USPS to your address on file. It may take 3-5 days  to receive your monitor after you have been enrolled.  Once you have received your monitor, please review the enclosed instructions. Your monitor  has already been registered assigning a specific monitor serial # to you.  Billing and Patient Assistance Program Information  We have supplied [employer] with any of your insurance information on file for billing purposes. [employer] offers a sliding scale Patient Assistance Program for patients that do not have  insurance, or whose insurance does not completely cover the cost of the ZIO monitor.  You must apply for the Patient Assistance Program to qualify for this discounted rate.  To apply, please call [employer] at [phone], select option 4, select option 2, ask to apply for  Patient Assistance Program. Meredeth Ide will ask your household income, and how many people  are in  your household. They will quote your out-of-pocket cost based on that information.  [employer] will also be able to set up a 77-month, interest-free payment plan if needed.  Applying the monitor   Shave hair from upper left chest.  Hold abrader disc by orange tab. Rub abrader in 40 strokes over the upper left chest as  indicated in your monitor instructions.  Clean area with 4 enclosed alcohol pads. Let dry.  Apply patch as indicated in monitor instructions. Patch will be placed under collarbone on left  side of chest with arrow pointing upward.  Rub patch adhesive wings for 2 minutes. Remove white label marked "1". Remove the white  label marked "2". Rub patch adhesive wings for 2 additional minutes.  While looking in a mirror, press and release button in center of patch. A small green light will  flash 3-4 times. This will be your only indicator that the monitor has been turned on.  Do not shower for the first 24 hours. You may shower after the first 24 hours.  Press the button if you feel a symptom. You will hear a small click. Record Date, Time and  Symptom in the Patient Logbook.  When you are ready to remove the patch, follow instructions on the last 2 pages of Patient  Logbook. Stick patch monitor onto the last page of Patient Logbook.  Place Patient Logbook in the blue and white box. Use locking tab on box and tape box closed  securely. The blue and white box has  prepaid postage on it. Please place it in the mailbox as  soon as possible. Your physician should have your test results approximately 7 days after the  monitor has been mailed back to [facility].  Call [facility] Customer Care at [phone] if you have questions regarding  your ZIO XT patch monitor. Call them immediately if you see an orange light blinking on your  monitor.  If your monitor falls off in less than 4 days, contact our Monitor department at [phone].  If your monitor becomes loose or falls off  after 4 days call [employer] at [phone] for  suggestions on securing your monitor    Follow-Up:  6 MONTHS WITH DR. Elease Hashimoto

## 2023-07-04 NOTE — Progress Notes (Unsigned)
Patient will apply 2nd Zio XT after wearing the 1st for 14 days

## 2023-07-08 ENCOUNTER — Emergency Department (HOSPITAL_COMMUNITY)
Admission: EM | Admit: 2023-07-08 | Discharge: 2023-07-09 | Disposition: A | Discharge status: Home / Self Care | Payer: Medicare Other | Attending: Emergency Medicine | Admitting: Emergency Medicine

## 2023-07-08 ENCOUNTER — Encounter (HOSPITAL_COMMUNITY): Payer: Self-pay

## 2023-07-08 ENCOUNTER — Emergency Department (HOSPITAL_COMMUNITY): Payer: Medicare Other

## 2023-07-08 DIAGNOSIS — R011 Cardiac murmur, unspecified: Secondary | ICD-10-CM | POA: Insufficient documentation

## 2023-07-08 DIAGNOSIS — I110 Hypertensive heart disease with heart failure: Secondary | ICD-10-CM | POA: Diagnosis not present

## 2023-07-08 DIAGNOSIS — I509 Heart failure, unspecified: Secondary | ICD-10-CM | POA: Insufficient documentation

## 2023-07-08 DIAGNOSIS — E876 Hypokalemia: Secondary | ICD-10-CM | POA: Diagnosis not present

## 2023-07-08 DIAGNOSIS — Z8673 Personal history of transient ischemic attack (TIA), and cerebral infarction without residual deficits: Secondary | ICD-10-CM | POA: Diagnosis not present

## 2023-07-08 DIAGNOSIS — E878 Other disorders of electrolyte and fluid balance, not elsewhere classified: Secondary | ICD-10-CM | POA: Insufficient documentation

## 2023-07-08 DIAGNOSIS — R42 Dizziness and giddiness: Secondary | ICD-10-CM | POA: Insufficient documentation

## 2023-07-08 DIAGNOSIS — Z79899 Other long term (current) drug therapy: Secondary | ICD-10-CM | POA: Diagnosis not present

## 2023-07-08 LAB — BASIC METABOLIC PANEL
Anion gap: 11 (ref 5–15)
BUN: 6 mg/dL — ABNORMAL LOW (ref 8–23)
CO2: 21 mmol/L — ABNORMAL LOW (ref 22–32)
Calcium: 9.8 mg/dL (ref 8.9–10.3)
Chloride: 103 mmol/L (ref 98–111)
Creatinine, Ser: 0.78 mg/dL (ref 0.44–1.00)
GFR, Estimated: >60 mL/min (ref >60)
Glucose, Bld: 129 mg/dL — ABNORMAL HIGH (ref 70–99)
Potassium: 3.3 mmol/L — ABNORMAL LOW (ref 3.5–5.1)
Sodium: 135 mmol/L (ref 135–145)

## 2023-07-08 LAB — CBC
HCT: 40.1 % (ref 36.0–46.0)
Hemoglobin: 13.7 g/dL (ref 12.0–15.0)
MCH: 31.1 pg (ref 26.0–34.0)
MCHC: 34.2 g/dL (ref 30.0–36.0)
MCV: 91.1 fL (ref 80.0–100.0)
Platelets: 236 10*3/uL (ref 150–400)
RBC: 4.4 MIL/uL (ref 3.87–5.11)
RDW: 14 % (ref 11.5–15.5)
WBC: 6.2 10*3/uL (ref 4.0–10.5)
nRBC: 0 % (ref 0.0–0.2)

## 2023-07-08 MED ORDER — MECLIZINE HCL 25 MG PO TABS
25.0000 mg | ORAL_TABLET | Freq: Once | ORAL | Status: AC
  Administered 2023-07-08: 25 mg via ORAL
  Filled 2023-07-08: qty 1

## 2023-07-08 NOTE — ED Provider Notes (Signed)
[name] EMERGENCY DEPARTMENT AT [facility] Provider Note   CSN: [id number] Arrival date & time: 07/08/23  2035     History  Chief Complaint  Patient presents with   Dizziness    Melanie [name] is a 89 y.o. female.   Dizziness   89 year old female presents emergency department with complaints of dizziness.  Patient states that symptoms began when she was walking in her living room around 5 PM.  Reports room spinning type sensation as well as difficulty focusing with her eyes.  States that she has had the symptoms similar in the past prompting visit to the [facility] emergency department in August.  At that time, had CTA imaging concerning for intracranial atherosclerotic disease resulting in severe stenosis/near occlusion of proximal superior left M2 division, moderate stenosis of the left anterior temporal branch and focal severe stenosis of left P2 segment.  States that her symptoms improved when she was waiting for a room in the emergency department.  When she was unable to reach her daughters, noted recurrence of dizziness.  Denies any slurred speech, facial droop, weakness has sensory deficits from baseline in upper lower extremities, gait abnormalities from baseline.  Does report decree sensation in both her arms and her legs that she states is chronic from degenerative changes in her cervical as well as lumbar spine.  Patient states that she does not want to stay in the hospital.  Past medical history significant for CHF, heart murmur, mitral valve insufficiency, aortic stenosis, CVA, hypertension, TIA, lumbar spinal stenosis, hemispheric carotid artery syndrome  Home Medications Prior to Admission medications   Medication Sig Start Date End Date Taking? Authorizing Provider  amLODipine (NORVASC) 5 MG tablet Take 1 tablet (5 mg total) by mouth daily. 03/21/23   Gaston Islam., NP  amoxicillin-clavulanate (AUGMENTIN) 875-125 MG tablet Take 1 tablet by mouth every  12 (twelve) hours. 07/09/23   Sherian Maroon A, PA  gabapentin (NEURONTIN) 100 MG capsule Take 100 mg by mouth at bedtime. 06/25/19   [provider]  hydroxypropyl methylcellulose / hypromellose (ISOPTO TEARS / GONIOVISC) 2.5 % ophthalmic solution Place 1 drop into both eyes 3 (three) times daily as needed for dry eyes.    [provider]  Multiple Vitamin (MULTIVITAMIN WITH MINERALS) TABS tablet Take 1 tablet by mouth daily.    [provider]  ondansetron (ZOFRAN-ODT) 4 MG disintegrating tablet Take 4 mg by mouth daily as needed for nausea or vomiting. 09/24/21   [provider]  pantoprazole (PROTONIX) 40 MG tablet Take 40 mg by mouth every evening. 07/15/18   [provider]  valsartan (DIOVAN) 320 MG tablet Take 1 tablet (320 mg total) by mouth daily. 01/23/23   Nahser, Deloris Ping, MD      Allergies    Ciprofloxacin    Review of Systems   Review of Systems  Neurological:  Positive for dizziness.  All other systems reviewed and are negative.   Physical Exam Updated Vital Signs BP 134/68   Pulse 62   Temp 97.7 F (36.5 C)   Resp 16   SpO2 93%  Physical Exam Vitals and nursing note reviewed.  Constitutional:      General: She is not in acute distress.    Appearance: She is well-developed.  HENT:     Head: Normocephalic and atraumatic.  Eyes:     Conjunctiva/sclera: Conjunctivae normal.  Cardiovascular:     Rate and Rhythm: Normal rate and regular rhythm.  Heart sounds: Murmur heard.  Pulmonary:     Effort: Pulmonary effort is normal. No respiratory distress.     Breath sounds: Normal breath sounds.  Abdominal:     Palpations: Abdomen is soft.     Tenderness: There is no abdominal tenderness.  Musculoskeletal:        General: No swelling.     Cervical back: Neck supple.     Right lower leg: No edema.     Left lower leg: No edema.  Skin:    General: Skin is warm and dry.     Capillary Refill: Capillary refill takes less  than 2 seconds.  Neurological:     Mental Status: She is alert.     Comments: Alert and oriented to self, place, time and event.   Speech is fluent, clear without dysarthria or dysphasia.   Strength symmetric in upper/lower extremities   Sensation intact in upper/lower extremities   Patient ambulates with cane/walker intermittently at baseline.  Able to ambulate independently but with very careful gait. CN I not tested  CN II not tested CN III, IV, VI PERRLA and EOMs intact bilaterally  CN V Intact sensation to sharp and light touch to the face  CN VII facial movements symmetric  CN VIII not tested  CN IX, X no uvula deviation, symmetric rise of soft palate  CN XI 5/5 SCM and trapezius strength bilaterally  CN XII Midline tongue protrusion, symmetric L/R movements     Psychiatric:        Mood and Affect: Mood normal.     ED Results / Procedures / Treatments   Labs (all labs ordered are listed, but only abnormal results are displayed) Labs Reviewed  BASIC METABOLIC PANEL - Abnormal; Notable for the following components:      Result Value   Potassium 3.3 (*)    CO2 21 (*)    Glucose, Bld 129 (*)    BUN 6 (*)    All other components within normal limits  CBC  MAGNESIUM  URINALYSIS, ROUTINE W REFLEX MICROSCOPIC  CBG MONITORING, ED  TROPONIN I (HIGH SENSITIVITY)    EKG None  Radiology MR BRAIN WO CONTRAST  Result Date: 07/09/2023 CLINICAL DATA:  Initial evaluation for neuro deficit, stroke suspected. EXAM: MRI HEAD WITHOUT CONTRAST TECHNIQUE: Multiplanar, multiecho pulse sequences of the brain and surrounding structures were obtained without intravenous contrast. COMPARISON:  Of prior studies from 03/27/2023. FINDINGS: Brain: Generalized age-related cerebral atrophy. Patchy T2/FLAIR hyperintensity involving the periventricular and deep white matter both cerebral hemispheres, consistent with chronic small vessel ischemic disease, mild for age. No evidence for acute or  subacute ischemia. Gray-white matter differentiation maintained. No acute or chronic intracranial blood products. No mass lesion, midline shift or mass effect. No hydrocephalus or extra-axial fluid collection. Pituitary gland and suprasellar region within normal limits. Vascular: Major intracranial vascular flow voids are maintained. Skull and upper cervical spine: Cranial junction within normal limits. Bone marrow signal intensity normal. No scalp soft tissue abnormality. Sinuses/Orbits: Prior bilateral ocular lens replacement. Paranasal sinuses are largely clear. Moderate left mastoid effusion. Other: None. IMPRESSION: 1. No acute intracranial abnormality. 2. Age-related cerebral atrophy with mild chronic small vessel ischemic disease. 3. Moderate left mastoid effusion, of uncertain significance. Correlation with physical exam recommended. Electronically Signed   By: Rise Mu M.D.   On: 07/09/2023 01:13    Procedures Procedures    Medications Ordered in ED Medications  meclizine (ANTIVERT) tablet 25 mg (25 mg Oral Given 07/08/23 2221)  ED Course/ Medical Decision Making/ A&P                                 Medical Decision Making Amount and/or Complexity of Data Reviewed Labs: ordered. Radiology: ordered.   This patient presents to the ED for concern of dizziness, this involves an extensive number of treatment options, and is a complaint that carries with it a high risk of complications and morbidity.  The differential diagnosis includes  CVA, Mnire's disease, labyrinthitis, BPPV, vertebrobasilar insufficiency, medication side effect, malignancy, ACS, arrhythmia, PE, other   Co morbidities that complicate the patient evaluation  See HPI   Additional history obtained:  Additional history obtained from EMR External records from outside source obtained and reviewed including hospital records   Lab Tests:  I Ordered, and personally interpreted labs.  The pertinent  results include: No leukocytosis.  No evidence of anemia.  Placed within range.  Mild hypokalemia of 3.3 as well as decreased bicarb of 21 but otherwise, electrolytes within normal limits.   No renal dysfunction.  Troponin of 11.   Imaging Studies ordered:  I ordered imaging studies including MRI brain I independently visualized and interpreted imaging which showed no acute abnormalities I agree with the radiologist interpretation   Cardiac Monitoring: / EKG:  The patient was maintained on a cardiac monitor.  I personally viewed and interpreted the cardiac monitored which showed an underlying rhythm of: Normal sinus rhythm   Consultations Obtained:  Consulted attending physician Dr. Durwin Nora.  Problem List / ED Course / Critical interventions / Medication management  Dizziness I ordered medication including meclizine  Reevaluation of the patient after these medicines showed that the patient improved I have reviewed the patients home medicines and have made adjustments as needed   Social Determinants of Health:  Denies tobacco, illicit drug use   Test / Admission - Considered:  Dizziness Vitals signs within normal range and stable throughout visit. Laboratory/imaging studies significant for: See above 89 year old female presents emergency department with complaints of dizziness described as room spinning type sensation as well as difficulty focusing with her vision and nauseated feeling.  Symptoms began around 5 PM last night which seemed to resolve upon arriving at the emergency department.  Patient was seen for similar symptoms on 03/27/2023 at [facility] emergency department.  CTA imaging at that time did show "severe stenosis proximal superior left M2 division, moderate stenosis of left anterior temporal branch and focal severe stenosis along left P2 segment."  Upon initial presentation today, patient's symptoms had resolved.  Patient initially stated that she did not want to stay  in the hospital.  Labs reassuring.  MRI brain without evidence of stroke.  MRI brain did show evidence of maxillary sinus disease; will treat with antibiotics in the outpatient setting.  Given prior CTA imaging findings, discussion was had with patient regarding admission or at least contacting specialists while in the ED given degree of stenosis in areas as above.  Patient acknowledged understanding and declined given that she is feeling better after medication.  Patient was able to ambulate with nursing staff and patient describes gait at baseline.  No recurrence of dizziness.  Will recommend very close follow-up with neurologist in the outpatient setting for further assessment. Worrisome signs and symptoms were discussed with the patient, and the patient acknowledged understanding to return to the ED if noticed. Patient was stable upon discharge.  Final Clinical Impression(s) / ED Diagnoses Final diagnoses:  Dizziness    Rx / DC Orders ED Discharge Orders          Ordered    amoxicillin-clavulanate (AUGMENTIN) 875-125 MG tablet  Every 12 hours,   Status:  Discontinued        07/09/23 0404    amoxicillin-clavulanate (AUGMENTIN) 875-125 MG tablet  Every 12 hours        07/09/23 0405              Peter Garter, PA 07/09/23 0419    Lorre Nick, MD 07/22/23 [phone]

## 2023-07-08 NOTE — ED Triage Notes (Signed)
Pt is coming in for dizziness, started at 5pm this afternoon, it is described as a spinning sensation. She has a Dx history of vertigo. There is some symptoms of nausea as well. She has not taken any medications for it today.   Medic vitals   155/74 74hr 99%ra 145bgl 18rr

## 2023-07-08 NOTE — ED Provider Triage Note (Signed)
Emergency Medicine Provider Triage Evaluation Note  Melanie [name] , a 89 y.o. female  was evaluated in triage.  Pt complains of dizziness.  History of vertigo and has taken meclizine in the past but states that she taken it before arriving.  Had some mild nausea but no vomiting.  Was given 4 mg of Zofran by EMS prior to arrival.  Denies any falls, headaches, or unilateral weakness or numbness.  She does endorse some diffuse weakness all over but no recent illness.  No chest pain shortness of breath at this time.  No vision changes.  Review of Systems  Positive: As above Negative: As above  Physical Exam  BP (!) 154/72 (BP Location: Left Arm)   Pulse 64   Temp 97.7 F (36.5 C) (Oral)   Resp 18   SpO2 100%  Gen:   Awake, no distress   Resp:  Normal effort  MSK:   Moves extremities without difficulty  Other:  No facial droop, dysarthria, unilateral weakness or numbness.  Upper extremity lower extremity strength symmetrical.  Medical Decision Making  Medically screening exam initiated at 8:48 PM.  Appropriate orders placed.  Melanie [name] was informed that the remainder of the evaluation will be completed by another provider, this initial triage assessment does not replace that evaluation, and the importance of remaining in the ED until their evaluation is complete.     Smitty Knudsen, PA-C 07/08/23 2050

## 2023-07-08 NOTE — ED Provider Notes (Incomplete)
[place] EMERGENCY DEPARTMENT AT [facility] Provider Note   CSN: [id number] Arrival date & time: 07/08/23  2035     History {Add pertinent medical, surgical, social history, OB history to HPI:1} Chief Complaint  Patient presents with  . Dizziness    Melanie [name] is a 89 y.o. female.   Dizziness   89 year old female presents emergency department with complaints of dizziness.  Patient states that symptoms began when she was walking in her living room around 5 PM.  Reports room spinning type sensation as well as difficulty focusing with her eyes.  States that she has had the symptoms similar in the past prompting visit to the [facility] emergency department in August.  At that time, had CTA imaging concerning for intracranial atherosclerotic disease resulting in severe stenosis/near occlusion of proximal superior left M2 division, moderate stenosis of the left anterior temporal branch and focal severe stenosis of left P2 segment.  States that her symptoms improved when she was waiting for a room in the emergency department.  When she was unable to reach her daughters, noted recurrence of dizziness.  Denies any slurred speech, facial droop, weakness has sensory deficits from baseline in upper lower extremities, gait abnormalities from baseline.  Does report decree sensation in both her arms and her legs that she states is chronic from degenerative changes in her cervical as well as lumbar spine.  Past medical history significant for CHF, heart murmur, mitral valve insufficiency, aortic stenosis, CVA, hypertension, TIA, lumbar spinal stenosis, hemispheric carotid artery syndrome  Home Medications Prior to Admission medications   Medication Sig Start Date End Date Taking? Authorizing Provider  amLODipine (NORVASC) 5 MG tablet Take 1 tablet (5 mg total) by mouth daily. 03/21/23   Gaston Islam., NP  gabapentin (NEURONTIN) 100 MG capsule Take 100 mg by mouth at bedtime.  06/25/19   [provider]  hydroxypropyl methylcellulose / hypromellose (ISOPTO TEARS / GONIOVISC) 2.5 % ophthalmic solution Place 1 drop into both eyes 3 (three) times daily as needed for dry eyes.    [provider]  Multiple Vitamin (MULTIVITAMIN WITH MINERALS) TABS tablet Take 1 tablet by mouth daily.    [provider]  ondansetron (ZOFRAN-ODT) 4 MG disintegrating tablet Take 4 mg by mouth daily as needed for nausea or vomiting. 09/24/21   [provider]  pantoprazole (PROTONIX) 40 MG tablet Take 40 mg by mouth every evening. 07/15/18   [provider]  valsartan (DIOVAN) 320 MG tablet Take 1 tablet (320 mg total) by mouth daily. 01/23/23   Nahser, Deloris Ping, MD      Allergies    Ciprofloxacin    Review of Systems   Review of Systems  Neurological:  Positive for dizziness.  All other systems reviewed and are negative.   Physical Exam Updated Vital Signs BP (!) 177/74   Pulse 71   Temp 97.6 F (36.4 C) (Oral)   Resp 16   SpO2 100%  Physical Exam Vitals and nursing note reviewed.  Constitutional:      General: She is not in acute distress.    Appearance: She is well-developed.  HENT:     Head: Normocephalic and atraumatic.  Eyes:     Conjunctiva/sclera: Conjunctivae normal.  Cardiovascular:     Rate and Rhythm: Normal rate and regular rhythm.     Heart sounds: Murmur heard.  Pulmonary:     Effort: Pulmonary effort is normal. No respiratory distress.     Breath sounds:  Normal breath sounds.  Abdominal:     Palpations: Abdomen is soft.     Tenderness: There is no abdominal tenderness.  Musculoskeletal:        General: No swelling.     Cervical back: Neck supple.     Right lower leg: No edema.     Left lower leg: No edema.  Skin:    General: Skin is warm and dry.     Capillary Refill: Capillary refill takes less than 2 seconds.  Neurological:     Mental Status: She is alert.     Comments: Alert and oriented to self,  place, time and event.   Speech is fluent, clear without dysarthria or dysphasia.   Strength symmetric in upper/lower extremities   Sensation intact in upper/lower extremities   Gait not assessed.*** CN I not tested  CN II not tested CN III, IV, VI PERRLA and EOMs intact bilaterally  CN V Intact sensation to sharp and light touch to the face  CN VII facial movements symmetric  CN VIII not tested  CN IX, X no uvula deviation, symmetric rise of soft palate  CN XI 5/5 SCM and trapezius strength bilaterally  CN XII Midline tongue protrusion, symmetric L/R movements     Psychiatric:        Mood and Affect: Mood normal.     ED Results / Procedures / Treatments   Labs (all labs ordered are listed, but only abnormal results are displayed) Labs Reviewed  CBC  BASIC METABOLIC PANEL  URINALYSIS, ROUTINE W REFLEX MICROSCOPIC  CBG MONITORING, ED    EKG None  Radiology No results found.  Procedures Procedures  {Document cardiac monitor, telemetry assessment procedure when appropriate:1}  Medications Ordered in ED Medications - No data to display  ED Course/ Medical Decision Making/ A&P   {   Click here for ABCD2, HEART and other calculatorsREFRESH Note before signing :1}                              Medical Decision Making Amount and/or Complexity of Data Reviewed Labs: ordered. Radiology: ordered.   This patient presents to the ED for concern of dizziness, this involves an extensive number of treatment options, and is a complaint that carries with it a high risk of complications and morbidity.  The differential diagnosis includes ***   Co morbidities that complicate the patient evaluation  See HPI   Additional history obtained:  Additional history obtained from EMR External records from outside source obtained and reviewed including ***   Lab Tests:  I Ordered, and personally interpreted labs.  The pertinent results include:  ***   Imaging Studies  ordered:  I ordered imaging studies including ***  I independently visualized and interpreted imaging which showed *** I agree with the radiologist interpretation   Cardiac Monitoring: / EKG:  The patient was maintained on a cardiac monitor.  I personally viewed and interpreted the cardiac monitored which showed an underlying rhythm of: ***   Consultations Obtained:  I requested consultation with the ***,  and discussed lab and imaging findings as well as pertinent plan - they recommend: ***   Problem List / ED Course / Critical interventions / Medication management  *** I ordered medication including ***  for ***  Reevaluation of the patient after these medicines showed that the patient {resolved/improved/worsened:23923::"improved"} I have reviewed the patients home medicines and have made adjustments as needed  Social Determinants of Health:  ***   Test / Admission - Considered:  Vitals signs significant for ***. Otherwise within normal range and stable throughout visit. Laboratory/imaging studies significant for: *** *** Worrisome signs and symptoms were discussed with the patient, and the patient acknowledged understanding to return to the ED if noticed. Patient was stable upon discharge.    {Document critical care time when appropriate:1} {Document review of labs and clinical decision tools ie heart score, Chads2Vasc2 etc:1}  {Document your independent review of radiology images, and any outside records:1} {Document your discussion with family members, caretakers, and with consultants:1} {Document social determinants of health affecting pt's care:1} {Document your decision making why or why not admission, treatments were needed:1} Final Clinical Impression(s) / ED Diagnoses Final diagnoses:  None    Rx / DC Orders ED Discharge Orders     None

## 2023-07-09 LAB — TROPONIN I (HIGH SENSITIVITY): Troponin I (High Sensitivity): 11 ng/L (ref <18)

## 2023-07-09 LAB — MAGNESIUM: Magnesium: 2.2 mg/dL (ref 1.7–2.4)

## 2023-07-09 MED ORDER — AMOXICILLIN-POT CLAVULANATE 875-125 MG PO TABS: 1.0000 | ORAL_TABLET | Freq: Two times a day (BID) | ORAL | 0 refills | Status: AC

## 2023-07-09 MED ORDER — AMOXICILLIN-POT CLAVULANATE 875-125 MG PO TABS: 1.0000 | ORAL_TABLET | Freq: Two times a day (BID) | ORAL | 0 refills | Status: DC

## 2023-07-09 NOTE — Discharge Instructions (Signed)
As discussed, MRI of your brain was negative for stroke.  Recommend close follow-up with primary care/neurology in the outpatient setting for reassessment.  Please do not hesitate to return to the emergency department if the worrisome signs and symptoms we discussed to become apparent.

## 2023-07-09 NOTE — ED Notes (Signed)
Ambulated pt, pt states she uses walker at home. She did hold on to my arm and did ambulate fairly well. When we went to turn around to head back towards the room, she did become a little off balance and then regained her balance

## 2023-07-17 DIAGNOSIS — R55 Syncope and collapse: Secondary | ICD-10-CM

## 2023-07-17 DIAGNOSIS — I10 Essential (primary) hypertension: Secondary | ICD-10-CM

## 2023-11-13 ENCOUNTER — Telehealth: Payer: Self-pay | Admitting: Cardiovascular Disease

## 2023-11-13 DIAGNOSIS — I6990 Unspecified sequelae of unspecified cerebrovascular disease: Secondary | ICD-10-CM | POA: Diagnosis not present

## 2023-11-13 DIAGNOSIS — M810 Age-related osteoporosis without current pathological fracture: Secondary | ICD-10-CM | POA: Diagnosis not present

## 2023-11-13 DIAGNOSIS — M5412 Radiculopathy, cervical region: Secondary | ICD-10-CM | POA: Diagnosis not present

## 2023-11-13 DIAGNOSIS — I10 Essential (primary) hypertension: Secondary | ICD-10-CM | POA: Diagnosis not present

## 2023-11-13 DIAGNOSIS — I6522 Occlusion and stenosis of left carotid artery: Secondary | ICD-10-CM | POA: Diagnosis not present

## 2023-11-13 DIAGNOSIS — E785 Hyperlipidemia, unspecified: Secondary | ICD-10-CM | POA: Diagnosis not present

## 2023-11-13 DIAGNOSIS — I700 Atherosclerosis of aorta: Secondary | ICD-10-CM | POA: Diagnosis not present

## 2023-11-13 DIAGNOSIS — E876 Hypokalemia: Secondary | ICD-10-CM | POA: Diagnosis not present

## 2023-11-13 DIAGNOSIS — M48061 Spinal stenosis, lumbar region without neurogenic claudication: Secondary | ICD-10-CM | POA: Diagnosis not present

## 2023-11-13 MED ORDER — AMLODIPINE BESYLATE 5 MG PO TABS: 5.0000 mg | ORAL_TABLET | Freq: Every day | ORAL | 2 refills | Status: DC

## 2023-11-13 MED ORDER — VALSARTAN 320 MG PO TABS: 320.0000 mg | ORAL_TABLET | Freq: Every day | ORAL | 2 refills | Status: DC

## 2023-11-13 NOTE — Telephone Encounter (Signed)
 Pt's medications were sent to pt's pharmacy as requested. Confirmation received.

## 2023-11-13 NOTE — Telephone Encounter (Signed)
*  STAT* If patient is at the pharmacy, call can be transferred to refill team.   1. Which medications need to be refilled? (please list name of each medication and dose if known) amLODipine (NORVASC) 5 MG tablet  valsartan (DIOVAN) 320 MG tablet   2. Which pharmacy/location (including street and city if local pharmacy) is medication to be sent to?  [id number] - [place], [place] - [address]      3. Do they need a 30 day or 90 day supply? 90 day    Pt is out of medication

## 2024-01-22 ENCOUNTER — Telehealth: Payer: Self-pay | Admitting: Cardiovascular Disease

## 2024-01-22 NOTE — Telephone Encounter (Signed)
 Pt reports BPs: Some examples in April: 4/6  164/81 4/9   154/85 4/10  169/80  May: 5/12  146/72 5/14   151/78 5/26   136/68, HR 59 5/27   135/69, HR 66 5/28   135/76, HR 58 5/29   143/71  She has not taken her Valsartan  in several days, last does taken on 5/25 She did not take her Amlodipine  today.    Aware will forward to Dr. Alroy Aspen for review/advisement.  Aware office will call her to arrange routine follow up  (recall for May). Patient verbalized understanding and agreeable to plan.

## 2024-01-22 NOTE — Telephone Encounter (Signed)
 Pt made aware of recommendation.  She will check BP BID over the next week and let us  know findings.  (Have not updated medication list.  Will await if further changes next week)

## 2024-01-22 NOTE — Telephone Encounter (Signed)
 Pt c/o BP issue: STAT if pt c/o blurred vision, one-sided weakness or slurred speech.  STAT if BP is GREATER than 180/120 TODAY.  STAT if BP is LESS than 90/60 and SYMPTOMATIC TODAY  1. What is your BP concern? Hypotension   2. Have you taken any BP medication today? No  3. What are your last 5 BP readings? 05/29 11:30 am 144/75 HR 67 05/28 AM 135/69  PM 143/71 HR 71 05/27 126/68 HR 59  4. Are you having any other symptoms (ex. Dizziness, headache, blurred vision, passed out)? No    Patient states she has not taken valsartan  or her morning dose of amlodipine  since 05/25. She has also only taken her night time dose of amlodipine  twice since 05/25, on 05/26 & 05/27. Please advise.

## 2024-01-22 NOTE — Telephone Encounter (Signed)
 I would try 1/2 of amlodipine  (2.5 mg) daily   Follow BP Send in readings next week

## 2024-01-23 MED ORDER — AMLODIPINE BESYLATE 5 MG PO TABS: 2.5000 mg | ORAL_TABLET | Freq: Every day | ORAL | 0 refills | Status: DC

## 2024-01-23 NOTE — Telephone Encounter (Signed)
 Called and got pt scheduled for 45m f/u visit with Nahser on 02/20/24. Refill for Amlodipine  sent to [facility] per request, at 2.5mg  that Dr Avanell Bob had recommended. Pt states she should have enough to last her until she gets this mail order.

## 2024-01-23 NOTE — Addendum Note (Signed)
 Addended by: Keller Patella on: 01/23/2024 09:37 AM   Modules accepted: Orders

## 2024-01-26 ENCOUNTER — Telehealth: Payer: Self-pay | Admitting: Cardiovascular Disease

## 2024-01-26 NOTE — Telephone Encounter (Signed)
 Patient wanted to verify the medications Dr. Alroy Aspen wants her to be taking. Patient is suppose to be taking amlodipine  2.5 mg and valsartan  320 mg daily. Patient stated her BP has been all over the place, but she has not been taking the Valsartan . Patient stated the amlodipine  is hard to cut the 5 mg tablets in half. Encouraged patient to take her BP daily and on Friday give our office a call with the readings. Informed patient if she needs a new prescription with 2.5 mg tablet we can change it on Friday, or if she needs to go back to 5 mg. Patient will call on Friday with readings.

## 2024-01-26 NOTE — Telephone Encounter (Signed)
Patient would like to speak to nurse to go over her medications.

## 2024-02-12 ENCOUNTER — Telehealth: Payer: Self-pay | Admitting: Cardiovascular Disease

## 2024-02-12 NOTE — Telephone Encounter (Signed)
*  STAT* If patient is at the pharmacy, call can be transferred to refill team.   1. Which medications need to be refilled? (please list name of each medication and dose if known)   amLODipine  (NORVASC ) 5 MG tablet  valsartan  (DIOVAN ) 320 MG tablet   2. Would you like to learn more about the convenience, safety, & potential cost savings by using the [facility]?   3. Are you open to using the [facility] (Type [facility]. ).  4. Which pharmacy/location (including street and city if local pharmacy) is medication to be sent to?  [id number] - [place],  - [address]   5. Do they need a 30 day or 90 day supply?   90 day  Patient stated she is almost out of these medications.

## 2024-02-13 MED ORDER — AMLODIPINE BESYLATE 5 MG PO TABS: 2.5000 mg | ORAL_TABLET | Freq: Every day | ORAL | 1 refills | Status: AC

## 2024-02-13 MED ORDER — VALSARTAN 320 MG PO TABS: 320.0000 mg | ORAL_TABLET | Freq: Every day | ORAL | 1 refills | Status: AC

## 2024-02-13 NOTE — Telephone Encounter (Signed)
 Pt's medications were sent to pt's pharmacy as requested. Confirmation received.

## 2024-02-19 NOTE — Progress Notes (Signed)
 Cardiology Office Note   Date:  02/20/2024   ID:  Melanie [name], DOB [birth year], MRN [id number]  PCP:  Onita Rush, MD  Cardiologist:   Aleene Passe, MD   Chief Complaint  Patient presents with   Hypertension        Mitral Regurgitation         Problem List 1. Mitral regurgitation  2. Back pains  3. Mild left carotid artery artery disease    Previous notes.  Melanie [name] is a 89 y.o. female who presents for evaluation of a new heart murmur .  She reports that she is here for a regular check up - no CP or dyspnea.  She used to see Dr. Rilla  ( moved to [place], [place])  Brought a record of her BP and HR readings - all look very good.   She has had an echo in Dr. Aileen office. Normal LV function . Possible PFO. , trace AI  Mild MR  BP and HR is very well controlled  Nov. 16, 2017:     Melanie [name] is seen back for a follow up visit after a year.  Her husband passed away this past 2024/01/18.   Awoke with some heart flutters ,   BP was a little elevated BP is great .  November 26, 2017: Melanie [name] seen back today for follow-up of her mild mitral regurgitation and hypertension. Was seen by [name] in July for palpitations .   No arrhythmias found  Has been to the ER several times.  - once for a UTI Another for reaction to a med - ? Flu shot   Not so much exercise recently but is active.   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  Has mild AS, mil dMR, mild TR   Nov. 4 2020  Has some swelling in her ankles at night. Better by morning.  She has a history of hypertension and I see that she is on amlodipine  2.5 mg twice a day.  She is on potassium chloride  but is not on a diuretic.  Her potassium was low at her last BMP   Dr. Onita has prescribed her the potassium   Dec 28, 2019:  Doing well  Has some occasional upper gastric pressure Occurs when she is sitting after eating .  Is not related to exertion   March 28, 2021: Seen with daughter, Melanie [name]  is seen today for further evaluation of some leg edema. She has not been eating much salt.  She does not elevate her legs.  We discussed leg elevation.  She is on amlodipine  which could be causing her leg edema to be worse than it otherwise would be. Home BP readings are elevated on occasion BP here today is good  Avoids salt and salty foods.    Nov. 6, 2023 Melanie [name] is seen today with daughter, Melanie [name]    Hx of HTN, mild leg swelling   Having some back issues   Brought her BP log with her  BP ranges from the normal to mildly elevated.  No syncope  Is taking Amlodipine  2.5 BID Valsartan  320 mg a day   Has back issues , legs are numb   Jul 04, 2023 Melanie [name] is seen for follow up of her HTN, leg swelling  Seen with daughter, Melanie [name]   Occasional eats a prepared breakfast - likely more salty than she needs  Has some episodes of presyncope , black and gold spots in her vision  Will place a monitor   February 20, 2024 Melanie [name] is seen for follow up of her HTN,   She brught her BP log with her  BP readings are very variable  Some are high, some normal , some on the lower / normal end  BP is normal today in the office        Past Medical History:  Diagnosis Date   Bruises easily    on Aspirin ;on hold for surgery   Carotid stenosis    CHF (congestive heart failure) (HCC)    diastolic dysfunction   Chronic back pain    stenosis   Chronic sinusitis    Diverticulosis    Esophageal stricture    GERD (gastroesophageal reflux disease)    takes Protonix  daily   Heart murmur    as a child   Hemorrhoids    History of colon polyps    Hypertension    takes Amlodipine ,HCTZ,and Metoprolol    Insomnia    Mitral insufficiency and aortic stenosis    Palpitations    Peripheral edema    Schatzki's ring    Scoliosis    Stroke [facility])     Past Surgical History:  Procedure Laterality Date   BREAST ENHANCEMENT SURGERY     CHOLECYSTECTOMY      COLONOSCOPY     DILATION AND CURETTAGE OF UTERUS     epidural injections     ESOPHAGOGASTRODUODENOSCOPY     EYE SURGERY     left cataract removed   LUMBAR LAMINECTOMY/DECOMPRESSION MICRODISCECTOMY Left 01/26/2013   Procedure: LUMBAR LAMINECTOMY/DECOMPRESSION MICRODISCECTOMY 1 LEVEL;  Surgeon: Victory Gens, MD;  Location: [facility];  Service: Neurosurgery;  Laterality: Left;  Left Lumbar four-five Laminectomy/Foraminotomy   steroid injections     x 3   TOTAL HIP ARTHROPLASTY Right      Current Outpatient Medications  Medication Sig Dispense Refill   amLODipine  (NORVASC ) 5 MG tablet Take 0.5 tablets (2.5 mg total) by mouth daily. 45 tablet 1   gabapentin  (NEURONTIN ) 100 MG capsule Take 100 mg by mouth at bedtime.     melatonin 3 MG TABS tablet 1-2 tablet at bedtime as needed Orally Once a day     Multiple Vitamin (MULTIVITAMIN WITH MINERALS) TABS tablet Take 1 tablet by mouth daily.     Multiple Vitamins-Minerals (PRESERVISION AREDS 2) CAPS as directed Orally     ondansetron  (ZOFRAN -ODT) 4 MG disintegrating tablet Take 4 mg by mouth daily as needed for nausea or vomiting.     pantoprazole  (PROTONIX ) 40 MG tablet Take 40 mg by mouth every evening.     valsartan  (DIOVAN ) 320 MG tablet Take 1 tablet (320 mg total) by mouth daily. 90 tablet 1   amoxicillin -clavulanate (AUGMENTIN ) 875-125 MG tablet Take 1 tablet by mouth every 12 (twelve) hours. (Patient not taking: Reported on 02/20/2024) 20 tablet 0   hydroxypropyl methylcellulose / hypromellose (ISOPTO TEARS / GONIOVISC) 2.5 % ophthalmic solution Place 1 drop into both eyes 3 (three) times daily as needed for dry eyes. (Patient not taking: Reported on 02/20/2024)     No current facility-administered medications for this visit.    Allergies:   Ciprofloxacin    Social History:  The patient  reports that she has never smoked. She has never used smokeless tobacco. She reports that she does not drink alcohol and does not use drugs.   Family  History:  The patient's family history includes CAD  in her brother; Lung cancer in her brother; Renal cancer in her sister; Rheum arthritis in her mother.    ROS:  Please see the history of present illness.      Physical Exam: Blood pressure 130/60, pulse 71, height 4' 11 (1.499 m), weight 103 lb (46.7 kg), SpO2 97%.       GEN: Elderly, somewhat frail female in no acute distress HEENT: Normal NECK: No JVD; soft left carotid bruit   LYMPHATICS: No lymphadenopathy CARDIAC: RRR , soft systolic murmur  RESPIRATORY:  Clear to auscultation without rales, wheezing or rhonchi  ABDOMEN: Soft, non-tender, non-distended MUSCULOSKELETAL:  No edema; No deformity  SKIN: Warm and dry NEUROLOGIC:  Alert and oriented x 3    EKG:           Recent Labs: 03/27/2023: ALT 6 07/08/2023: BUN 6; Creatinine, Ser 0.78; Hemoglobin 13.7; Magnesium 2.2; Platelets 236; Potassium 3.3; Sodium 135    Lipid Panel    Component Value Date/Time   CHOL 181 07/18/2016 0253   TRIG 93 07/18/2016 0253   HDL 56 07/18/2016 0253   CHOLHDL 3.2 07/18/2016 0253   VLDL 19 07/18/2016 0253   LDLCALC 106 (H) 07/18/2016 0253      Wt Readings from Last 3 Encounters:  02/20/24 103 lb (46.7 kg)  07/04/23 103 lb (46.7 kg)  03/27/23 103 lb (46.7 kg)      Other studies Reviewed: Additional studies/ records that were reviewed today include: . Review of the above records demonstrates:    ASSESSMENT AND PLAN: . 1. Leg edema:  stable , no edema today        2. Near syncope:    no episodes recently .     I hesitate to increase any of her BP meds given her hx of orthostatic hypotension and her overall frail state.    3. Mild left carotid artery artery disease :     4.  HTN:     is avoiding salt to the best that she can .  Now lives at [facility] so its difficult to know how much salt she is actually gettnig    Current medicines are reviewed at length with the patient today.  The patient does not have concerns  regarding medicines.  The following changes have been made:  no change  Labs/ tests ordered today include:   No orders of the defined types were placed in this encounter.        Aleene Passe, MD  02/20/2024 5:56 PM    [facility] [address] [place], [place], [place]  [address] Phone: [phone]; Fax: [phone]

## 2024-02-20 ENCOUNTER — Encounter: Payer: Self-pay | Admitting: Cardiovascular Disease

## 2024-02-20 ENCOUNTER — Ambulatory Visit: Payer: Self-pay | Attending: Cardiovascular Disease | Admitting: Cardiovascular Disease

## 2024-02-20 VITALS — BP 130/60 | HR 71 | Ht 59.0 in | Wt 103.0 lb

## 2024-02-20 DIAGNOSIS — I10 Essential (primary) hypertension: Secondary | ICD-10-CM | POA: Diagnosis not present

## 2024-02-20 NOTE — Patient Instructions (Signed)
 Medication Instructions:  Your physician recommends that you continue on your current medications as directed. Please refer to the Current Medication list given to you today.  *If you need a refill on your cardiac medications before your next appointment, please call your pharmacy*  Lab Work: NONE  If you have labs (blood work) drawn today and your tests are completely normal, you will receive your results only by: MyChart Message (if you have MyChart) OR A paper copy in the mail If you have any lab test that is abnormal or we need to change your treatment, we will call you to review the results.  Testing/Procedures: NONE  Follow-Up: At [facility], you and your health needs are our priority.  As part of our continuing mission to provide you with exceptional heart care, our providers are all part of one team.  This team includes your primary Cardiologist (physician) and Advanced Practice Providers or APPs (Physician Assistants and Nurse Practitioners) who all work together to provide you with the care you need, when you need it.  Your next appointment:   1 year  Provider:   Aleene Passe, MD

## 2024-03-12 ENCOUNTER — Encounter: Payer: Self-pay | Admitting: Advanced Practice Midwife

## 2024-06-14 DIAGNOSIS — R54 Age-related physical debility: Secondary | ICD-10-CM | POA: Diagnosis not present

## 2024-06-14 DIAGNOSIS — Z0000 Encounter for general adult medical examination without abnormal findings: Secondary | ICD-10-CM | POA: Diagnosis not present

## 2024-06-14 DIAGNOSIS — K219 Gastro-esophageal reflux disease without esophagitis: Secondary | ICD-10-CM | POA: Diagnosis not present

## 2024-06-14 DIAGNOSIS — Z1331 Encounter for screening for depression: Secondary | ICD-10-CM | POA: Diagnosis not present

## 2024-06-14 DIAGNOSIS — I10 Essential (primary) hypertension: Secondary | ICD-10-CM | POA: Diagnosis not present

## 2024-06-14 DIAGNOSIS — I6990 Unspecified sequelae of unspecified cerebrovascular disease: Secondary | ICD-10-CM | POA: Diagnosis not present

## 2024-06-14 DIAGNOSIS — Z1389 Encounter for screening for other disorder: Secondary | ICD-10-CM | POA: Diagnosis not present

## 2024-06-14 DIAGNOSIS — E785 Hyperlipidemia, unspecified: Secondary | ICD-10-CM | POA: Diagnosis not present

## 2024-06-14 DIAGNOSIS — Z23 Encounter for immunization: Secondary | ICD-10-CM | POA: Diagnosis not present

## 2024-06-14 DIAGNOSIS — I6522 Occlusion and stenosis of left carotid artery: Secondary | ICD-10-CM | POA: Diagnosis not present

## 2024-06-14 DIAGNOSIS — I349 Nonrheumatic mitral valve disorder, unspecified: Secondary | ICD-10-CM | POA: Diagnosis not present

## 2024-06-14 DIAGNOSIS — M810 Age-related osteoporosis without current pathological fracture: Secondary | ICD-10-CM | POA: Diagnosis not present

## 2024-06-14 DIAGNOSIS — I2721 Secondary pulmonary arterial hypertension: Secondary | ICD-10-CM | POA: Diagnosis not present

## 2024-06-14 DIAGNOSIS — R1310 Dysphagia, unspecified: Secondary | ICD-10-CM | POA: Diagnosis not present

## 2024-06-14 DIAGNOSIS — E7849 Other hyperlipidemia: Secondary | ICD-10-CM | POA: Diagnosis not present

## 2024-06-30 DIAGNOSIS — M810 Age-related osteoporosis without current pathological fracture: Secondary | ICD-10-CM | POA: Diagnosis not present

## 2024-06-30 DIAGNOSIS — R011 Cardiac murmur, unspecified: Secondary | ICD-10-CM | POA: Diagnosis not present

## 2024-06-30 DIAGNOSIS — E785 Hyperlipidemia, unspecified: Secondary | ICD-10-CM | POA: Diagnosis not present

## 2024-06-30 DIAGNOSIS — G629 Polyneuropathy, unspecified: Secondary | ICD-10-CM | POA: Diagnosis not present

## 2024-06-30 DIAGNOSIS — I349 Nonrheumatic mitral valve disorder, unspecified: Secondary | ICD-10-CM | POA: Diagnosis not present

## 2024-06-30 DIAGNOSIS — I10 Essential (primary) hypertension: Secondary | ICD-10-CM | POA: Diagnosis not present

## 2024-06-30 DIAGNOSIS — K219 Gastro-esophageal reflux disease without esophagitis: Secondary | ICD-10-CM | POA: Diagnosis not present

## 2024-06-30 DIAGNOSIS — Z809 Family history of malignant neoplasm, unspecified: Secondary | ICD-10-CM | POA: Diagnosis not present

## 2024-06-30 DIAGNOSIS — Z8249 Family history of ischemic heart disease and other diseases of the circulatory system: Secondary | ICD-10-CM | POA: Diagnosis not present

## 2024-07-23 DIAGNOSIS — R92323 Mammographic fibroglandular density, bilateral breasts: Secondary | ICD-10-CM | POA: Diagnosis not present

## 2024-07-23 DIAGNOSIS — Z1231 Encounter for screening mammogram for malignant neoplasm of breast: Secondary | ICD-10-CM | POA: Diagnosis not present
